# Patient Record
Sex: Male | Born: 1975 | Race: Black or African American | Hispanic: No | Marital: Single | Smoking: Former smoker
Health system: Southern US, Community
[De-identification: ages and names within clinical notes are randomized; demographics above are authoritative.]

## PROBLEM LIST (undated history)

## (undated) ENCOUNTER — Encounter

## (undated) ENCOUNTER — Telehealth

## (undated) ENCOUNTER — Encounter: Attending: Psychologist | Primary: Psychologist

## (undated) ENCOUNTER — Ambulatory Visit: Payer: Medicaid (Managed Care)

## (undated) ENCOUNTER — Ambulatory Visit

## (undated) ENCOUNTER — Encounter: Attending: Nephrology | Primary: Nephrology

## (undated) ENCOUNTER — Non-Acute Institutional Stay: Payer: BLUE CROSS/BLUE SHIELD | Attending: Nephrology | Primary: Nephrology

## (undated) ENCOUNTER — Encounter: Payer: BLUE CROSS/BLUE SHIELD | Attending: Nephrology | Primary: Nephrology

## (undated) ENCOUNTER — Encounter
Payer: BLUE CROSS/BLUE SHIELD | Attending: Pharmacist Clinician (PhC)/ Clinical Pharmacy Specialist | Primary: Pharmacist Clinician (PhC)/ Clinical Pharmacy Specialist

## (undated) ENCOUNTER — Ambulatory Visit: Payer: BLUE CROSS/BLUE SHIELD

## (undated) ENCOUNTER — Ambulatory Visit: Payer: BLUE CROSS/BLUE SHIELD | Attending: Nephrology | Primary: Nephrology

## (undated) ENCOUNTER — Encounter: Attending: Physician Assistant | Primary: Physician Assistant

## (undated) ENCOUNTER — Non-Acute Institutional Stay: Payer: BLUE CROSS/BLUE SHIELD | Attending: General Practice | Primary: General Practice

## (undated) ENCOUNTER — Telehealth: Attending: Psychologist | Primary: Psychologist

## (undated) ENCOUNTER — Ambulatory Visit: Payer: MEDICAID

## (undated) DIAGNOSIS — E109 Type 1 diabetes mellitus without complications: Secondary | ICD-10-CM

## (undated) DIAGNOSIS — K219 Gastro-esophageal reflux disease without esophagitis: Secondary | ICD-10-CM

## (undated) DIAGNOSIS — I10 Essential (primary) hypertension: Secondary | ICD-10-CM

## (undated) DIAGNOSIS — N189 Chronic kidney disease, unspecified: Secondary | ICD-10-CM

## (undated) HISTORY — DX: Essential (primary) hypertension: I10

## (undated) HISTORY — DX: Chronic kidney disease, unspecified: N18.9

## (undated) HISTORY — DX: Type 1 diabetes mellitus without complications: E10.9

## (undated) HISTORY — DX: Gastro-esophageal reflux disease without esophagitis: K21.9

---
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
## 2012-11-24 HISTORY — PX: INSERTION OF DIALYSIS CATHETER: SHX1324

## 2012-12-07 ENCOUNTER — Inpatient Hospital Stay: Payer: Self-pay | Admitting: Internal Medicine

## 2012-12-07 LAB — CBC
MCV: 86 fL (ref 80–100)
Platelet: 402 10*3/uL (ref 150–440)
RBC: 3.85 10*6/uL — ABNORMAL LOW (ref 4.40–5.90)
RDW: 15.3 % — ABNORMAL HIGH (ref 11.5–14.5)
WBC: 8.7 10*3/uL (ref 3.8–10.6)

## 2012-12-07 LAB — COMPREHENSIVE METABOLIC PANEL
Albumin: 2.9 g/dL — ABNORMAL LOW (ref 3.4–5.0)
Anion Gap: 7 (ref 7–16)
BUN: 49 mg/dL — ABNORMAL HIGH (ref 7–18)
Bilirubin,Total: 0.1 mg/dL — ABNORMAL LOW (ref 0.2–1.0)
Calcium, Total: 7.4 mg/dL — ABNORMAL LOW (ref 8.5–10.1)
Co2: 23 mmol/L (ref 21–32)
Creatinine: 8.19 mg/dL — ABNORMAL HIGH (ref 0.60–1.30)
EGFR (African American): 9 — ABNORMAL LOW
EGFR (Non-African Amer.): 7 — ABNORMAL LOW
Glucose: 245 mg/dL — ABNORMAL HIGH (ref 65–99)
Osmolality: 293 (ref 275–301)
Potassium: 5.3 mmol/L — ABNORMAL HIGH (ref 3.5–5.1)
SGOT(AST): 29 U/L (ref 15–37)
Sodium: 136 mmol/L (ref 136–145)
Total Protein: 7.8 g/dL (ref 6.4–8.2)

## 2012-12-07 LAB — URINALYSIS, COMPLETE
Bilirubin,UR: NEGATIVE
Blood: NEGATIVE
Nitrite: NEGATIVE
Ph: 6 (ref 4.5–8.0)
RBC,UR: 1 /HPF (ref 0–5)
Specific Gravity: 1.01 (ref 1.003–1.030)
Squamous Epithelial: <1
WBC UR: 1 /HPF (ref 0–5)

## 2012-12-07 LAB — IRON AND TIBC
Iron Bind.Cap.(Total): 251 ug/dL (ref 250–450)
Iron: 46 ug/dL — ABNORMAL LOW (ref 65–175)

## 2012-12-07 LAB — RAPID HIV-1/2 QL/CONFIRM: HIV-1/2,Rapid Ql: NEGATIVE

## 2012-12-08 LAB — CBC WITH DIFFERENTIAL/PLATELET
Eosinophil %: 5 %
HCT: 29.7 % — ABNORMAL LOW (ref 40.0–52.0)
Lymphocyte #: 2.8 10*3/uL (ref 1.0–3.6)
MCH: 29.1 pg (ref 26.0–34.0)
MCHC: 33.7 g/dL (ref 32.0–36.0)
MCV: 86 fL (ref 80–100)
Monocyte #: 0.7 x10 3/mm (ref 0.2–1.0)
Neutrophil #: 4.8 10*3/uL (ref 1.4–6.5)
Neutrophil %: 54.1 %
Platelet: 374 10*3/uL (ref 150–440)
RBC: 3.44 10*6/uL — ABNORMAL LOW (ref 4.40–5.90)
RDW: 15.2 % — ABNORMAL HIGH (ref 11.5–14.5)
WBC: 8.8 10*3/uL (ref 3.8–10.6)

## 2012-12-08 LAB — BASIC METABOLIC PANEL
Calcium, Total: 7.3 mg/dL — ABNORMAL LOW (ref 8.5–10.1)
Chloride: 107 mmol/L (ref 98–107)
Co2: 23 mmol/L (ref 21–32)
EGFR (African American): 8 — ABNORMAL LOW
EGFR (Non-African Amer.): 7 — ABNORMAL LOW
Osmolality: 298 (ref 275–301)
Sodium: 139 mmol/L (ref 136–145)

## 2012-12-08 LAB — PHOSPHORUS: Phosphorus: 5.6 mg/dL — ABNORMAL HIGH (ref 2.5–4.9)

## 2012-12-08 LAB — PROTEIN ELECTROPHORESIS(ARMC)

## 2012-12-09 LAB — BASIC METABOLIC PANEL
Anion Gap: 8 (ref 7–16)
BUN: 69 mg/dL — ABNORMAL HIGH (ref 7–18)
Creatinine: 8.79 mg/dL — ABNORMAL HIGH (ref 0.60–1.30)
EGFR (African American): 8 — ABNORMAL LOW
EGFR (Non-African Amer.): 7 — ABNORMAL LOW
Glucose: 39 mg/dL — CL (ref 65–99)
Osmolality: 292 (ref 275–301)
Potassium: 4.1 mmol/L (ref 3.5–5.1)

## 2012-12-09 LAB — PHOSPHORUS: Phosphorus: 6.4 mg/dL — ABNORMAL HIGH (ref 2.5–4.9)

## 2012-12-10 LAB — PROTEIN / CREATININE RATIO, URINE
Creatinine, Urine: 99.7 mg/dL (ref 30.0–125.0)
Protein, Random Urine: 251 mg/dL — ABNORMAL HIGH (ref 0–12)
Protein/Creat. Ratio: 2518 mg/gCREAT — ABNORMAL HIGH (ref 0–200)

## 2012-12-11 LAB — PHOSPHORUS: Phosphorus: 4.7 mg/dL (ref 2.5–4.9)

## 2012-12-13 LAB — UR PROT ELECTROPHORESIS, URINE RANDOM

## 2013-01-17 ENCOUNTER — Ambulatory Visit: Payer: Self-pay | Admitting: Vascular Surgery

## 2013-01-17 LAB — BASIC METABOLIC PANEL
Anion Gap: 3 — ABNORMAL LOW (ref 7–16)
Chloride: 103 mmol/L (ref 98–107)
Co2: 25 mmol/L (ref 21–32)
Creatinine: 7.13 mg/dL — ABNORMAL HIGH (ref 0.60–1.30)
EGFR (Non-African Amer.): 9 — ABNORMAL LOW
Glucose: 472 mg/dL — ABNORMAL HIGH (ref 65–99)
Osmolality: 299 (ref 275–301)

## 2013-01-17 LAB — CBC
HGB: 9.5 g/dL — ABNORMAL LOW (ref 13.0–18.0)
MCH: 28.6 pg (ref 26.0–34.0)
MCHC: 33.2 g/dL (ref 32.0–36.0)
MCV: 86 fL (ref 80–100)
Platelet: 358 10*3/uL (ref 150–440)
RDW: 14.4 % (ref 11.5–14.5)
WBC: 10.4 10*3/uL (ref 3.8–10.6)

## 2013-01-18 ENCOUNTER — Ambulatory Visit: Payer: Self-pay | Admitting: Vascular Surgery

## 2013-03-16 ENCOUNTER — Ambulatory Visit (INDEPENDENT_AMBULATORY_CARE_PROVIDER_SITE_OTHER): Payer: Medicaid Other | Admitting: Gastroenterology

## 2013-03-16 ENCOUNTER — Encounter: Payer: Self-pay | Admitting: Gastroenterology

## 2013-03-16 ENCOUNTER — Encounter (INDEPENDENT_AMBULATORY_CARE_PROVIDER_SITE_OTHER): Payer: Self-pay

## 2013-03-16 ENCOUNTER — Other Ambulatory Visit: Payer: Self-pay | Admitting: Gastroenterology

## 2013-03-16 VITALS — BP 140/87 | HR 77 | Temp 97.6°F | Ht 72.5 in | Wt 268.6 lb

## 2013-03-16 DIAGNOSIS — R1013 Epigastric pain: Secondary | ICD-10-CM

## 2013-03-16 DIAGNOSIS — K3189 Other diseases of stomach and duodenum: Secondary | ICD-10-CM

## 2013-03-16 NOTE — Progress Notes (Signed)
Primary Care Physician:  Lucius Conn Primary Gastroenterologist:  Dr. Darrick Penna   Chief Complaint  Patient presents with  . Emesis    HPI:   [name] is a very pleasant 38 year old male who presents today at the request of [facility], [place], [place]. He has a history of Type 1 Diabetes, kidney disease requiring dialysis, and hypertension. Referred to [place] due to chronic vomiting. Wakes up and sneezes, tries to blow nose, starts coughing, then throws up phlegm, mucus, and spit. Only occurs in the morning. Has sensation of pressure in epigastric region. Continues throughout the day. Underlying nausea. Lots of burping for no reason. Quit dark sodas. Symptoms present since April 2014. No pain with eating. No dysphagia. Intermittent reflux. Was placed on Omeprazole, which helps with indigestion. Only takes prn. No melena or hematochezia. Rare NSAIDs for headaches. No aspirin powders. Early satiety but still has good appetite. Feels bloated at times.   Dialysis Tues, Thurs, Sat. Thinking about peritoneal dialysis.   Past Medical History  Diagnosis Date  . Chronic kidney disease   . Type 1 diabetes   . Hypertension   . GERD (gastroesophageal reflux disease)     Past Surgical History  Procedure Laterality Date  . Insertion of dialysis catheter  11/2012    Current Outpatient Prescriptions  Medication Sig Dispense Refill  . cloNIDine (CATAPRES) 0.3 MG tablet Take 0.3 mg by mouth 2 (two) times daily.      . insulin regular (NOVOLIN R,HUMULIN R) 100 units/mL injection Inject 20 Units into the skin 2 (two) times daily before a meal. 20 units in am  20 units in pm      . omeprazole (PRILOSEC) 20 MG capsule Take 20 mg by mouth as needed.       No current facility-administered medications for this visit.    Allergies as of 03/16/2013  . (No Known Allergies)    Family History  Problem Relation Age of Onset  . Colon cancer Neg Hx      History   Social History  . Marital Status: Single    Spouse Name: N/A    Number of Children: N/A  . Years of Education: N/A   Occupational History  . Not on file.   Social History Main Topics  . Smoking status: Current Some Day Smoker -- 0.50 packs/day    Types: Cigarettes  . Smokeless tobacco: Not on file  . Alcohol Use: No  . Drug Use: No  . Sexual Activity: Not on file   Other Topics Concern  . Not on file   Social History Narrative  . No narrative on file    Review of Systems: Negative unless mentioned in HPI.   Physical Exam: BP 140/87  Pulse 77  Temp(Src) 97.6 F (36.4 C) (Oral)  Ht 6' 0.5" (1.842 m)  Wt 268 lb 9.6 oz (121.836 kg)  BMI 35.91 kg/m2 General:   Alert and oriented. Pleasant and cooperative. Well-nourished and well-developed.  Head:  Normocephalic and atraumatic. Eyes:  Without icterus, sclera clear and conjunctiva pink.  Ears:  Normal auditory acuity. Nose:  No deformity, discharge,  or lesions. Mouth:  No deformity or lesions, oral mucosa pink.  Neck:  Supple, without mass or thyromegaly. Lungs:  Clear to auscultation bilaterally. No wheezes, rales, or rhonchi. No distress.  Heart:  S1, S2 present without murmurs appreciated.  Abdomen:  +BS, soft, non-tender and non-distended. No HSM noted. No guarding or rebound. No  masses appreciated.  Rectal:  Deferred  Msk:  Symmetrical without gross deformities. Normal posture. Extremities:  Without clubbing or edema. Neurologic:  Alert and  oriented x4;  grossly normal neurologically. Skin:  Intact without significant lesions or rashes. Cervical Nodes:  No significant cervical adenopathy. Psych:  Alert and cooperative. Normal mood and affect.   Labs Dec 2014:  Hgb 10.2, Hct 31.1 BUN 56, Cr 9.85

## 2013-03-16 NOTE — Patient Instructions (Signed)
We have scheduled you for an upper endoscopy with Dr. Darrick PennaFields in the near future. Further recommendations to follow.  Take Prilosec 20 mg each morning, 30 minutes before breakfast.

## 2013-03-16 NOTE — Assessment & Plan Note (Signed)
38 year old male with chronic vomiting, early satiety, and "pressure" in epigastric region. Underlying nausea noted. Only taking PPI prn. Gastritis, PUD in differentials. With underlying nausea and history of diabetes, unable to rule out gastroparesis. Uncontrolled GERD could also be playing a role. Noted anemia, with Hgb 10.2 in the setting of chronic kidney disease on dialysis. Likely of chronic disease.   Start taking Prilosec daily, not prn Proceed with upper endoscopy in the near future with Dr. Darrick PennaFields. The risks, benefits, and alternatives have been discussed in detail with patient. They have stated understanding and desire to proceed.  Consider GES

## 2013-03-17 NOTE — Progress Notes (Signed)
cc'd to pcp 

## 2013-03-24 LAB — CBC
BUN: 56 mg/dL — AB (ref 4–21)
CREATININE: 9.85
HEMATOCRIT: 31 %
HEMOGLOBIN: 10.2 g/dL

## 2013-03-25 ENCOUNTER — Encounter (HOSPITAL_COMMUNITY): Payer: Self-pay | Admitting: Pharmacy Technician

## 2013-04-06 ENCOUNTER — Ambulatory Visit: Payer: Self-pay | Admitting: Vascular Surgery

## 2013-04-06 LAB — BASIC METABOLIC PANEL
Anion Gap: 5 — ABNORMAL LOW (ref 7–16)
BUN: 40 mg/dL — AB (ref 7–18)
CALCIUM: 8.7 mg/dL (ref 8.5–10.1)
CHLORIDE: 100 mmol/L (ref 98–107)
CREATININE: 9.52 mg/dL — AB (ref 0.60–1.30)
Co2: 27 mmol/L (ref 21–32)
GFR CALC AF AMER: 7 — AB
GFR CALC NON AF AMER: 6 — AB
Glucose: 209 mg/dL — ABNORMAL HIGH (ref 65–99)
OSMOLALITY: 280 (ref 275–301)
Potassium: 5.1 mmol/L (ref 3.5–5.1)
Sodium: 132 mmol/L — ABNORMAL LOW (ref 136–145)

## 2013-04-06 LAB — CBC
HCT: 35.3 % — AB (ref 40.0–52.0)
HGB: 11.7 g/dL — ABNORMAL LOW (ref 13.0–18.0)
MCH: 28.7 pg (ref 26.0–34.0)
MCHC: 33.2 g/dL (ref 32.0–36.0)
MCV: 87 fL (ref 80–100)
Platelet: 355 10*3/uL (ref 150–440)
RBC: 4.09 10*6/uL — ABNORMAL LOW (ref 4.40–5.90)
RDW: 17.6 % — AB (ref 11.5–14.5)
WBC: 7.3 10*3/uL (ref 3.8–10.6)

## 2013-04-08 ENCOUNTER — Encounter (HOSPITAL_COMMUNITY)
Admission: RE | Disposition: A | Discharge status: Home / Self Care | Payer: Self-pay | Source: Ambulatory Visit | Attending: Gastroenterology

## 2013-04-08 ENCOUNTER — Ambulatory Visit (HOSPITAL_COMMUNITY)
Admission: RE | Admit: 2013-04-08 | Discharge: 2013-04-08 | Disposition: A | Discharge status: Home / Self Care | Payer: Medicaid Other | Source: Ambulatory Visit | Attending: Gastroenterology | Admitting: Gastroenterology

## 2013-04-08 DIAGNOSIS — K2940 Chronic atrophic gastritis without bleeding: Secondary | ICD-10-CM | POA: Insufficient documentation

## 2013-04-08 DIAGNOSIS — Z794 Long term (current) use of insulin: Secondary | ICD-10-CM | POA: Insufficient documentation

## 2013-04-08 DIAGNOSIS — E109 Type 1 diabetes mellitus without complications: Secondary | ICD-10-CM | POA: Insufficient documentation

## 2013-04-08 DIAGNOSIS — K2100 Gastro-esophageal reflux disease with esophagitis, without bleeding: Secondary | ICD-10-CM

## 2013-04-08 DIAGNOSIS — R1013 Epigastric pain: Secondary | ICD-10-CM | POA: Insufficient documentation

## 2013-04-08 DIAGNOSIS — Z01812 Encounter for preprocedural laboratory examination: Secondary | ICD-10-CM | POA: Insufficient documentation

## 2013-04-08 DIAGNOSIS — I129 Hypertensive chronic kidney disease with stage 1 through stage 4 chronic kidney disease, or unspecified chronic kidney disease: Secondary | ICD-10-CM | POA: Insufficient documentation

## 2013-04-08 DIAGNOSIS — K2900 Acute gastritis without bleeding: Secondary | ICD-10-CM

## 2013-04-08 DIAGNOSIS — N189 Chronic kidney disease, unspecified: Secondary | ICD-10-CM | POA: Insufficient documentation

## 2013-04-08 DIAGNOSIS — R1110 Vomiting, unspecified: Secondary | ICD-10-CM | POA: Insufficient documentation

## 2013-04-08 HISTORY — PX: ESOPHAGOGASTRODUODENOSCOPY: SHX5428

## 2013-04-08 LAB — GLUCOSE, CAPILLARY: Glucose-Capillary: 107 mg/dL — ABNORMAL HIGH (ref 70–99)

## 2013-04-08 SURGERY — EGD (ESOPHAGOGASTRODUODENOSCOPY)
Anesthesia: Moderate Sedation

## 2013-04-08 MED ORDER — ONDANSETRON 4 MG PO TBDP: ORAL_TABLET | ORAL | Status: DC

## 2013-04-08 MED ORDER — MIDAZOLAM HCL 5 MG/5ML IJ SOLN
INTRAMUSCULAR | Status: DC | PRN
  Administered 2013-04-08 (×3): 2 mg via INTRAVENOUS

## 2013-04-08 MED ORDER — SODIUM CHLORIDE 0.9 % IV SOLN
INTRAVENOUS | Status: DC
  Administered 2013-04-08: 1000 mL via INTRAVENOUS

## 2013-04-08 MED ORDER — OMEPRAZOLE 20 MG PO CPDR: DELAYED_RELEASE_CAPSULE | ORAL | Status: DC

## 2013-04-08 MED ORDER — STERILE WATER FOR IRRIGATION IR SOLN
Status: DC | PRN
  Administered 2013-04-08: 09:00:00

## 2013-04-08 MED ORDER — HYDRALAZINE HCL 20 MG/ML IJ SOLN
INTRAMUSCULAR | Status: DC | PRN
  Administered 2013-04-08 (×2): 10 mg via INTRAVENOUS

## 2013-04-08 MED ORDER — FENTANYL CITRATE 0.05 MG/ML IJ SOLN
INTRAMUSCULAR | Status: AC
  Filled 2013-04-08: qty 4

## 2013-04-08 MED ORDER — MEPERIDINE HCL 100 MG/ML IJ SOLN
INTRAMUSCULAR | Status: AC
  Filled 2013-04-08: qty 2

## 2013-04-08 MED ORDER — FENTANYL CITRATE 0.05 MG/ML IJ SOLN
INTRAMUSCULAR | Status: DC | PRN
  Administered 2013-04-08: 25 ug via INTRAVENOUS
  Administered 2013-04-08 (×2): 50 ug via INTRAVENOUS

## 2013-04-08 MED ORDER — CETIRIZINE HCL 10 MG PO TABS: ORAL_TABLET | ORAL | Status: DC

## 2013-04-08 MED ORDER — LIDOCAINE VISCOUS 2 % MT SOLN
OROMUCOSAL | Status: DC | PRN
  Administered 2013-04-08: 2 mL via OROMUCOSAL

## 2013-04-08 MED ORDER — HYDRALAZINE HCL 20 MG/ML IJ SOLN
INTRAMUSCULAR | Status: AC
  Filled 2013-04-08: qty 1

## 2013-04-08 MED ORDER — MIDAZOLAM HCL 5 MG/5ML IJ SOLN
INTRAMUSCULAR | Status: AC
  Filled 2013-04-08: qty 10

## 2013-04-08 MED ORDER — LIDOCAINE VISCOUS 2 % MT SOLN
OROMUCOSAL | Status: AC
  Filled 2013-04-08: qty 15

## 2013-04-08 NOTE — Discharge Instructions (Signed)
YOUR vomiting & NAUSEA ARE DUE TO coughing/POSTERIOR NASAL DRIP, esophagitis, & gastritis. YOU HAVE A SMALL HIATAL HERNIA. I biopsied your stomach.   INCREASE OMEPRAZOLE.  TAKE 30 MINUTES PRIOR TO YOUR MEALS TWICE DAILY.   CONTINUE YOUR WEIGHT LOSS EFFORTS. YOU SHOULD LOSE 20 LBS.  STRICTLY FOLLOW A LOW FAT/DIABETIC DIET DIET. SEE INFO BELOW.  YOUR BIOPSY WILL BE BACK IN 7 DAYS.  FOLLOW UP MAY 2015.    UPPER ENDOSCOPY AFTER CARE Read the instructions outlined below and refer to this sheet in the next week. These discharge instructions provide you with general information on caring for yourself after you leave the hospital. While your treatment has been planned according to the most current medical practices available, unavoidable complications occasionally occur. If you have any problems or questions after discharge, call DR. Kendle Erker, [phone].  ACTIVITY  You may resume your regular activity, but move at a slower pace for the next 24 hours.   Take frequent rest periods for the next 24 hours.   Walking will help get rid of the air and reduce the bloated feeling in your belly (abdomen).   No driving for 24 hours (because of the medicine (anesthesia) used during the test).   You may shower.   Do not sign any important legal documents or operate any machinery for 24 hours (because of the anesthesia used during the test).    NUTRITION  Drink plenty of fluids.   You may resume your normal diet as instructed by your doctor.   Begin with a light meal and progress to your normal diet. Heavy or fried foods are harder to digest and may make you feel sick to your stomach (nauseated).   Avoid alcoholic beverages for 24 hours or as instructed.    MEDICATIONS  You may resume your normal medications.   WHAT YOU CAN EXPECT TODAY  Some feelings of bloating in the abdomen.   Passage of more gas than usual.    IF YOU HAD A BIOPSY TAKEN DURING THE UPPER ENDOSCOPY:  Eat a soft  diet IF YOU HAVE NAUSEA, BLOATING, ABDOMINAL PAIN, OR VOMITING.    FINDING OUT THE RESULTS OF YOUR TEST Not all test results are available during your visit. DR. Darrick Penna WILL CALL YOU WITHIN 7 DAYS OF YOUR PROCEDUE WITH YOUR RESULTS. Do not assume everything is normal if you have not heard from DR. Waylin Dorko IN ONE WEEK, CALL HER OFFICE AT [phone].  SEEK IMMEDIATE MEDICAL ATTENTION AND CALL THE OFFICE: [phone] IF:  You have more than a spotting of blood in your stool.   Your belly is swollen (abdominal distention).   You are nauseated or vomiting.   You have a temperature over 101F.   You have abdominal pain or discomfort that is severe or gets worse throughout the day.   Gastritis  Gastritis is an inflammation (the body's way of reacting to injury and/or infection) of the stomach. It is often caused by bacterial (germ) infections. It can also be caused BY ASPIRIN, BC/GOODY POWDER'S, (IBUPROFEN) MOTRIN, OR ALEVE (NAPROXEN), chemicals (including alcohol), SPICY FOODS, and medications. This illness may be associated with generalized malaise (feeling tired, not well), UPPER ABDOMINAL STOMACH cramps, and fever. One common bacterial cause of gastritis is an organism known as H. Pylori. This can be treated with antibiotics.   REFLUX Esophagitis   TREATMENT There are a number of medicines used to treat reflux esophagitis including: Antacids.  ZANTAC Proton-pump inhibitors: PRILOSEC (OMEPRAZOLE)  HOME CARE INSTRUCTIONS Eat 2-3  hours before going to bed.  Try to reach and maintain a healthy weight. LOSE 10-20 LBS Do not eat just a few very large meals. Instead, eat 4 TO 6 smaller meals throughout the day.  Try to identify foods and beverages that make your symptoms worse, and avoid these.  Avoid tight clothing.  Do not exercise right after eating.   Low-Fat Diet BREADS, CEREALS, PASTA, RICE, DRIED PEAS, AND BEANS These products are high in carbohydrates and most are low in  fat. Therefore, they can be increased in the diet as substitutes for fatty foods. They too, however, contain calories and should not be eaten in excess. Cereals can be eaten for snacks as well as for breakfast.  Include foods that contain fiber (fruits, vegetables, whole grains, and legumes). Research shows that fiber may lower blood cholesterol levels, especially the water-soluble fiber found in fruits, vegetables, oat products, and legumes. FRUITS AND VEGETABLES It is good to eat fruits and vegetables. Besides being sources of fiber, both are rich in vitamins and some minerals. They help you get the daily allowances of these nutrients. Fruits and vegetables can be used for snacks and desserts. MEATS Limit lean meat, chicken, [place], and fish to no more than 6 ounces per day. Beef, Pork, and Lamb Use lean cuts of beef, pork, and lamb. Lean cuts include:  Extra-lean ground beef.  Arm roast.  Sirloin tip.  Center-cut ham.  Round steak.  Loin chops.  Rump roast.  Tenderloin.  Trim all fat off the outside of meats before cooking. It is not necessary to severely decrease the intake of red meat, but lean choices should be made. Lean meat is rich in protein and contains a highly absorbable form of iron. Premenopausal women, in particular, should avoid reducing lean red meat because this could increase the risk for low red blood cells (iron-deficiency anemia).  Chicken and [place] These are good sources of protein. The fat of poultry can be reduced by removing the skin and underlying fat layers before cooking. Chicken and [place] can be substituted for lean red meat in the diet. Poultry should not be fried or covered with high-fat sauces. Fish and Shellfish Fish is a good source of protein. Shellfish contain cholesterol, but they usually are low in saturated fatty acids. The preparation of fish is important. Like chicken and [place], they should not be fried or covered with high-fat sauces. EGGS Egg  whites contain no fat or cholesterol. They can be eaten often. Try 1 to 2 egg whites instead of whole eggs in recipes or use egg substitutes that do not contain yolk.  MILK AND DAIRY PRODUCTS Use skim or 1% milk instead of 2% or whole milk. Decrease whole milk, natural, and processed cheeses. Use nonfat or low-fat (2%) cottage cheese or low-fat cheeses made from vegetable oils. Choose nonfat or low-fat (1 to 2%) yogurt. Experiment with evaporated skim milk in recipes that call for heavy cream. Substitute low-fat yogurt or low-fat cottage cheese for sour cream in dips and salad dressings. Have at least 2 servings of low-fat dairy products, such as 2 glasses of skim (or 1%) milk each day to help get your daily calcium intake.  FATS AND OILS Butterfat, lard, and beef fats are high in saturated fat and cholesterol. These should be avoided.Vegetable fats do not contain cholesterol. AVOID coconut oil, palm oil, and palm kernel oil, WHICH are very high in saturated fats. These should be limited. These fats are often used in [employer]akery goods,  processed foods, popcorn, oils, and nondairy creamers. Vegetable shortenings and some peanut butters contain hydrogenated oils, which are also saturated fats. Read the labels on these foods and check for saturated vegetable oils.  Desirable liquid vegetable oils are corn oil, cottonseed oil, olive oil, canola oil, safflower oil, soybean oil, and sunflower oil. Peanut oil is not as good, but small amounts are acceptable. Buy a heart-healthy tub margarine that has no partially hydrogenated oils in the ingredients. AVOID Mayonnaise and salad dressings often are made from unsaturated fats.  OTHER EATING TIPS Snacks  Most sweets should be limited as snacks. They tend to be rich in calories and fats, and their caloric content outweighs their nutritional value. Some good choices in snacks are graham crackers, melba toast, soda crackers, bagels (no egg), English muffins, fruits, and  vegetables. These snacks are preferable to snack crackers, [place] fries, and chips. Popcorn should be air-popped or cooked in small amounts of liquid vegetable oil.  Desserts Eat fruit, low-fat yogurt, and fruit ices instead of pastries, cake, and cookies. Sherbet, angel food cake, gelatin dessert, frozen low-fat yogurt, or other frozen products that do not contain saturated fat (pure fruit juice bars, frozen ice pops) are also acceptable.   COOKING METHODS Choose those methods that use little or no fat. They include: Poaching.  Braising.  Steaming.  Grilling.  Baking.  Stir-frying.  Broiling.  Microwaving.  Foods can be cooked in a nonstick pan without added fat, or use a nonfat cooking spray in regular cookware. Limit fried foods and avoid frying in saturated fat. Add moisture to lean meats by using water, broth, cooking wines, and other nonfat or low-fat sauces along with the cooking methods mentioned above. Soups and stews should be chilled after cooking. The fat that forms on top after a few hours in the refrigerator should be skimmed off. When preparing meals, avoid using excess salt. Salt can contribute to raising blood pressure in some people.  EATING AWAY FROM HOME Order entres, potatoes, and vegetables without sauces or butter. When meat exceeds the size of a deck of cards (3 to 4 ounces), the rest can be taken home for another meal. Choose vegetable or fruit salads and ask for low-calorie salad dressings to be served on the side. Use dressings sparingly. Limit high-fat toppings, such as bacon, crumbled eggs, cheese, sunflower seeds, and olives. Ask for heart-healthy tub margarine instead of butter.  Hiatal Hernia A hiatal hernia occurs when a part of the stomach slides above the diaphragm. The diaphragm is the thin muscle separating the belly (abdomen) from the chest. A hiatal hernia can be something you are born with or develop over time. Hiatal hernias may allow stomach acid to  flow back into your esophagus, the tube which carries food from your mouth to your stomach. If this acid causes problems it is called GERD (gastro-esophageal reflux disease).   SYMPTOMS Common symptoms of GERD are heartburn (burning in your chest). This is worse when lying down or bending over. It may also cause belching and indigestion. Some of the things which make GERD worse are:  Increased weight pushes on stomach making acid rise more easily.   Smoking markedly increases acid production.   Alcohol decreases lower esophageal sphincter pressure (valve between stomach and esophagus), allowing acid from stomach into esophagus.   Late evening meals and going to bed with a full stomach increases pressure.   Anything that causes an increase in acid production.    HOME CARE INSTRUCTIONS  Try to achieve and maintain an ideal body weight.   Avoid drinking alcoholic beverages.   DO NOT smokE.   Do not wear tight clothing around your chest or stomach.   Eat smaller meals and eat more frequently. This keeps your stomach from getting too full. Eat slowly.   Do not lie down for 2 or 3 hours after eating. Do not eat or drink anything 1 to 2 hours before going to bed.   Avoid caffeine beverages (colas, coffee, cocoa, tea), fatty foods, citrus fruits and all other foods and drinks that contain acid and that seem to increase the problems.   Avoid bending over, especially after eating OR STRAINING. Anything that increases the pressure in your belly increases the amount of acid that may be pushed up into your esophagus.

## 2013-04-08 NOTE — Progress Notes (Signed)
REVIEWED.  

## 2013-04-08 NOTE — Progress Notes (Signed)
Has dialysis catheter right chest. Does dialysis tues, thurs and sat. Last dialized on tues 04/05/13.

## 2013-04-08 NOTE — H&P (Signed)
Primary Care Physician:  Lucius ConnOBERTSON, ANTHONY T, PA-C Primary Gastroenterologist:  Dr. Darrick PennaFields  Pre-Procedure History & Physical: HPI:  [name] is a 38 y.o. male here for DYSPEPSIA.  Past Medical History  Diagnosis Date  . Chronic kidney disease   . Type 1 diabetes   . Hypertension   . GERD (gastroesophageal reflux disease)     Past Surgical History  Procedure Laterality Date  . Insertion of dialysis catheter  11/2012    Prior to Admission medications   Medication Sig Start Date End Date Taking? Authorizing Provider  cloNIDine (CATAPRES) 0.3 MG tablet Take 0.3 mg by mouth 2 (two) times daily.    Historical Provider, MD  insulin NPH Human (HUMULIN N,NOVOLIN N) 100 UNIT/ML injection Inject 20 Units into the skin 2 (two) times daily before a meal.    Historical Provider, MD  insulin regular (NOVOLIN R,HUMULIN R) 100 units/mL injection Inject 20 Units into the skin 2 (two) times daily before a meal.     Historical Provider, MD  omeprazole (PRILOSEC) 20 MG capsule Take 20 mg by mouth daily as needed (Acid Reflux).     Historical Provider, MD    Allergies as of 03/16/2013  . (No Known Allergies)    Family History  Problem Relation Age of Onset  . Colon cancer Neg Hx     History   Social History  . Marital Status: Single    Spouse Name: N/A    Number of Children: N/A  . Years of Education: N/A   Occupational History  . Not on file.   Social History Main Topics  . Smoking status: Current Some Day Smoker -- 0.50 packs/day    Types: Cigarettes  . Smokeless tobacco: Not on file  . Alcohol Use: No  . Drug Use: No  . Sexual Activity: Not on file   Other Topics Concern  . Not on file   Social History Narrative  . No narrative on file    Review of Systems: See HPI, otherwise negative ROS   Physical Exam: There were no vitals taken for this visit. General:   Alert,  pleasant and cooperative in NAD Head:  Normocephalic and atraumatic. Neck:  Supple; Lungs:   Clear throughout to auscultation.    Heart:  Regular rate and rhythm. Abdomen:  Soft, nontender and nondistended. Normal bowel sounds, without guarding, and without rebound.   Neurologic:  Alert and  oriented x4;  grossly normal neurologically.  Impression/Plan:     DYSPEPSIA  PLAN:  EGD TODAY

## 2013-04-08 NOTE — Op Note (Signed)
[facility] [address] [place], [id number]   ENDOSCOPY PROCEDURE REPORT  PATIENT: [name], [name]  MR#: [id number] BIRTHDATE: 1975/06/26 , 37  yrs. old GENDER: Male  ENDOSCOPIST: Jonette EvaSandi Zainah Steven, MD REFERRED QI:ONGEXBMBY:Anthony Robertsobn, PA-C  PROCEDURE DATE: 04/08/2013 PROCEDURE:   EGD w/ biopsy INDICATIONS:Vomiting USU EVERY AM SINCE APR 2014(BMI 35.9) PRECEDED BY SNEEZING/ PND/COUGHING.[name].   Epigastric pain.   Heartburn. PMHx: WU(1324M(1993) NOW ON HD MEDICATIONS: Fentanyl 125 mcg IV, Versed 6 mg IV, and Promethazine (Phenergan) 12.5mg  IV TOPICAL ANESTHETIC:   Viscous Xylocaine  DESCRIPTION OF PROCEDURE:     Physical exam was performed.  Informed consent was obtained from the patient after explaining the benefits, risks, and alternatives to the procedure.  The patient was connected to the monitor and placed in the left lateral position.  Continuous oxygen was provided by nasal cannula and IV medicine administered through an indwelling cannula.  After administration of sedation, the patients esophagus was intubated and the EG-2990i ([id number]) and EG-2990i ([id number])  endoscope was advanced under direct visualization to the second portion of the duodenum.  The scope was removed slowly by carefully examining the color, texture, anatomy, and integrity of the mucosa on the way out.  The patient was recovered in endoscopy and discharged home in satisfactory condition.   ESOPHAGUS: MULTIPLE LINEAR EROSIONS IN DISTAL ESOPHAGUS.   STOMACH: Mild erosive gastritis (inflammation) was found in the gastric antrum.  Multiple biopsies were performed using cold forceps. DUODENUM: The duodenal mucosa showed no abnormalities in the bulb and second portion of the duodenum.  COMPLICATIONS:   None  ENDOSCOPIC IMPRESSION: 1.  NAUSEA/VOMITING/DUE TO POST-TUSSIVE EMESIS, REFLUX ESOPHAGITIS, AND GASTRITIS 2.  EPIGASTRIC PAIN DUE TO REFLUX ESOPHAGITIS/GASTRITIS  RECOMMENDATIONS: INCREASE  OMEPRAZOLE.  TAKE 30 MINUTES PRIOR TO YOUR MEALS TWICE DAILY. ZYRTEC 10 MG QHS PT WILL CALL IN 2 WEEKS IF SYMPTOMS ARE NOT IMPROVED. CONTINUE WEIGHT LOSS EFFORTS.  LOSE 20 LBS. STRICTLY FOLLOW A LOW FAT/DIABETIC DIET DIET. BIOPSY WILL BE BACK IN 7 DAYS. FOLLOW UP MAY 2015. CONSIDER GES IF VOMITING   REPEAT EXAM:   _______________________________ Rosalie DoctoreSignedJonette Eva:  Alla Sloma, MD 04/08/2013 5:10 PM       PATIENT NAME:  [name], [name] MR#: [id number]

## 2013-04-12 ENCOUNTER — Encounter (HOSPITAL_COMMUNITY): Payer: Self-pay | Admitting: Gastroenterology

## 2013-04-14 ENCOUNTER — Telehealth: Payer: Self-pay | Admitting: Gastroenterology

## 2013-04-14 NOTE — Telephone Encounter (Signed)
Please call pt. His stomach Bx shows gastritis.    USE ZYRTEC QHS.  INCREASE OMEPRAZOLE.  TAKE 30 MINUTES PRIOR TO YOUR MEALS TWICE DAILY.   CONTINUE YOUR WEIGHT LOSS EFFORTS.  LOSE 20 LBS.  STRICTLY FOLLOW A LOW FAT/DIABETIC DIET DIET.  FOLLOW UP MAY 2015 E30 NAUSEA W/ AS OR SLF.

## 2013-04-15 ENCOUNTER — Ambulatory Visit: Payer: Self-pay | Admitting: Vascular Surgery

## 2013-04-15 NOTE — Telephone Encounter (Signed)
LMOM to call.

## 2013-04-19 NOTE — Telephone Encounter (Signed)
Reminder in epic °

## 2013-04-20 NOTE — Telephone Encounter (Signed)
Pt returned call and was informed of results.  

## 2013-04-20 NOTE — Telephone Encounter (Signed)
LMOM to call.

## 2013-11-01 ENCOUNTER — Inpatient Hospital Stay: Payer: Self-pay | Admitting: Internal Medicine

## 2013-11-01 LAB — CBC WITH DIFFERENTIAL/PLATELET
BASOS ABS: 0.1 10*3/uL (ref 0.0–0.1)
Basophil %: 0.8 %
Eosinophil #: 0.1 10*3/uL (ref 0.0–0.7)
Eosinophil %: 0.6 %
HCT: 28.6 % — AB (ref 40.0–52.0)
HGB: 9 g/dL — ABNORMAL LOW (ref 13.0–18.0)
Lymphocyte #: 0.8 10*3/uL — ABNORMAL LOW (ref 1.0–3.6)
Lymphocyte %: 5.5 %
MCH: 28 pg (ref 26.0–34.0)
MCHC: 31.6 g/dL — ABNORMAL LOW (ref 32.0–36.0)
MCV: 89 fL (ref 80–100)
Monocyte #: 0.4 x10 3/mm (ref 0.2–1.0)
Monocyte %: 2.7 %
NEUTROS ABS: 13.3 10*3/uL — AB (ref 1.4–6.5)
Neutrophil %: 90.4 %
PLATELETS: 455 10*3/uL — AB (ref 150–440)
RBC: 3.23 10*6/uL — ABNORMAL LOW (ref 4.40–5.90)
RDW: 16 % — ABNORMAL HIGH (ref 11.5–14.5)
WBC: 14.7 10*3/uL — AB (ref 3.8–10.6)

## 2013-11-01 LAB — COMPREHENSIVE METABOLIC PANEL
Albumin: 2.2 g/dL — ABNORMAL LOW (ref 3.4–5.0)
Alkaline Phosphatase: 80 U/L
Anion Gap: 13 (ref 7–16)
BUN: 54 mg/dL — AB (ref 7–18)
Bilirubin,Total: 0.4 mg/dL (ref 0.2–1.0)
CO2: 22 mmol/L (ref 21–32)
Calcium, Total: 6.5 mg/dL — CL (ref 8.5–10.1)
Chloride: 101 mmol/L (ref 98–107)
Creatinine: 16.27 mg/dL — ABNORMAL HIGH (ref 0.60–1.30)
EGFR (African American): 4 — ABNORMAL LOW
GFR CALC NON AF AMER: 3 — AB
GLUCOSE: 177 mg/dL — AB (ref 65–99)
OSMOLALITY: 291 (ref 275–301)
Potassium: 6.5 mmol/L (ref 3.5–5.1)
SGOT(AST): 42 U/L — ABNORMAL HIGH (ref 15–37)
SGPT (ALT): 33 U/L
SODIUM: 136 mmol/L (ref 136–145)
TOTAL PROTEIN: 7.3 g/dL (ref 6.4–8.2)

## 2013-11-02 LAB — BASIC METABOLIC PANEL
ANION GAP: 13 (ref 7–16)
BUN: 50 mg/dL — ABNORMAL HIGH (ref 7–18)
CALCIUM: 6.7 mg/dL — AB (ref 8.5–10.1)
CHLORIDE: 101 mmol/L (ref 98–107)
Co2: 22 mmol/L (ref 21–32)
Creatinine: 17.04 mg/dL — ABNORMAL HIGH (ref 0.60–1.30)
EGFR (African American): 4 — ABNORMAL LOW
GFR CALC NON AF AMER: 3 — AB
GLUCOSE: 129 mg/dL — AB (ref 65–99)
Osmolality: 287 (ref 275–301)
POTASSIUM: 5.4 mmol/L — AB (ref 3.5–5.1)
Sodium: 136 mmol/L (ref 136–145)

## 2013-11-02 LAB — CBC WITH DIFFERENTIAL/PLATELET
BASOS PCT: 0.8 %
Basophil #: 0.1 10*3/uL (ref 0.0–0.1)
Eosinophil #: 0.4 10*3/uL (ref 0.0–0.7)
Eosinophil %: 2.8 %
HCT: 25 % — ABNORMAL LOW (ref 40.0–52.0)
HGB: 8 g/dL — ABNORMAL LOW (ref 13.0–18.0)
LYMPHS ABS: 1.2 10*3/uL (ref 1.0–3.6)
Lymphocyte %: 9.7 %
MCH: 27.9 pg (ref 26.0–34.0)
MCHC: 32 g/dL (ref 32.0–36.0)
MCV: 87 fL (ref 80–100)
Monocyte #: 0.9 x10 3/mm (ref 0.2–1.0)
Monocyte %: 7.4 %
NEUTROS ABS: 10.2 10*3/uL — AB (ref 1.4–6.5)
NEUTROS PCT: 79.3 %
Platelet: 401 10*3/uL (ref 150–440)
RBC: 2.88 10*6/uL — ABNORMAL LOW (ref 4.40–5.90)
RDW: 16.2 % — ABNORMAL HIGH (ref 11.5–14.5)
WBC: 12.9 10*3/uL — ABNORMAL HIGH (ref 3.8–10.6)

## 2013-11-03 LAB — RENAL FUNCTION PANEL
ANION GAP: 10 (ref 7–16)
Albumin: 1.8 g/dL — ABNORMAL LOW (ref 3.4–5.0)
BUN: 42 mg/dL — AB (ref 7–18)
CHLORIDE: 98 mmol/L (ref 98–107)
CREATININE: 12.48 mg/dL — AB (ref 0.60–1.30)
Calcium, Total: 7.3 mg/dL — ABNORMAL LOW (ref 8.5–10.1)
Co2: 27 mmol/L (ref 21–32)
EGFR (African American): 5 — ABNORMAL LOW
GFR CALC NON AF AMER: 4 — AB
Glucose: 126 mg/dL — ABNORMAL HIGH (ref 65–99)
Osmolality: 282 (ref 275–301)
Phosphorus: 6 mg/dL — ABNORMAL HIGH (ref 2.5–4.9)
Potassium: 4.1 mmol/L (ref 3.5–5.1)
SODIUM: 135 mmol/L — AB (ref 136–145)

## 2013-11-04 LAB — DRUG SCREEN, URINE
AMPHETAMINES, UR SCREEN: NEGATIVE (ref ?–1000)
BENZODIAZEPINE, UR SCRN: POSITIVE (ref ?–200)
Barbiturates, Ur Screen: NEGATIVE (ref ?–200)
Cannabinoid 50 Ng, Ur ~~LOC~~: NEGATIVE (ref ?–50)
Cocaine Metabolite,Ur ~~LOC~~: NEGATIVE (ref ?–300)
MDMA (ECSTASY) UR SCREEN: NEGATIVE (ref ?–500)
METHADONE, UR SCREEN: NEGATIVE (ref ?–300)
Opiate, Ur Screen: NEGATIVE (ref ?–300)
PHENCYCLIDINE (PCP) UR S: NEGATIVE (ref ?–25)
Tricyclic, Ur Screen: NEGATIVE (ref ?–1000)

## 2013-11-04 LAB — CBC WITH DIFFERENTIAL/PLATELET
BASOS ABS: 0.2 10*3/uL — AB (ref 0.0–0.1)
BASOS PCT: 1.4 %
Eosinophil #: 0 10*3/uL (ref 0.0–0.7)
Eosinophil %: 0.2 %
HCT: 23.9 % — AB (ref 40.0–52.0)
HGB: 7.5 g/dL — ABNORMAL LOW (ref 13.0–18.0)
LYMPHS ABS: 1.4 10*3/uL (ref 1.0–3.6)
LYMPHS PCT: 10.6 %
MCH: 27.9 pg (ref 26.0–34.0)
MCHC: 31.6 g/dL — AB (ref 32.0–36.0)
MCV: 88 fL (ref 80–100)
MONO ABS: 1.1 x10 3/mm — AB (ref 0.2–1.0)
Monocyte %: 8.3 %
NEUTROS PCT: 79.5 %
Neutrophil #: 10.2 10*3/uL — ABNORMAL HIGH (ref 1.4–6.5)
PLATELETS: 392 10*3/uL (ref 150–440)
RBC: 2.71 10*6/uL — AB (ref 4.40–5.90)
RDW: 15.5 % — AB (ref 11.5–14.5)
WBC: 12.8 10*3/uL — AB (ref 3.8–10.6)

## 2013-11-04 LAB — BASIC METABOLIC PANEL
ANION GAP: 14 (ref 7–16)
BUN: 27 mg/dL — ABNORMAL HIGH (ref 7–18)
CO2: 25 mmol/L (ref 21–32)
Calcium, Total: 7.1 mg/dL — ABNORMAL LOW (ref 8.5–10.1)
Chloride: 96 mmol/L — ABNORMAL LOW (ref 98–107)
Creatinine: 9.67 mg/dL — ABNORMAL HIGH (ref 0.60–1.30)
EGFR (African American): 7 — ABNORMAL LOW
EGFR (Non-African Amer.): 6 — ABNORMAL LOW
Glucose: 250 mg/dL — ABNORMAL HIGH (ref 65–99)
Osmolality: 284 (ref 275–301)
POTASSIUM: 4.6 mmol/L (ref 3.5–5.1)
Sodium: 135 mmol/L — ABNORMAL LOW (ref 136–145)

## 2013-11-05 DIAGNOSIS — I369 Nonrheumatic tricuspid valve disorder, unspecified: Secondary | ICD-10-CM

## 2013-11-05 LAB — CBC WITH DIFFERENTIAL/PLATELET
BASOS ABS: 0.1 10*3/uL (ref 0.0–0.1)
Basophil %: 0.6 %
EOS PCT: 0.3 %
Eosinophil #: 0 10*3/uL (ref 0.0–0.7)
HCT: 24.1 % — AB (ref 40.0–52.0)
HGB: 7.6 g/dL — AB (ref 13.0–18.0)
LYMPHS PCT: 7.1 %
Lymphocyte #: 1 10*3/uL (ref 1.0–3.6)
MCH: 28 pg (ref 26.0–34.0)
MCHC: 31.6 g/dL — AB (ref 32.0–36.0)
MCV: 89 fL (ref 80–100)
Monocyte #: 0.8 x10 3/mm (ref 0.2–1.0)
Monocyte %: 5.7 %
Neutrophil #: 12.4 10*3/uL — ABNORMAL HIGH (ref 1.4–6.5)
Neutrophil %: 86.3 %
Platelet: 434 10*3/uL (ref 150–440)
RBC: 2.72 10*6/uL — ABNORMAL LOW (ref 4.40–5.90)
RDW: 15.8 % — ABNORMAL HIGH (ref 11.5–14.5)
WBC: 14.4 10*3/uL — AB (ref 3.8–10.6)

## 2013-11-05 LAB — RENAL FUNCTION PANEL
Albumin: 1.7 g/dL — ABNORMAL LOW (ref 3.4–5.0)
Anion Gap: 15 (ref 7–16)
BUN: 41 mg/dL — AB (ref 7–18)
CREATININE: 12.11 mg/dL — AB (ref 0.60–1.30)
Calcium, Total: 7.4 mg/dL — ABNORMAL LOW (ref 8.5–10.1)
Chloride: 94 mmol/L — ABNORMAL LOW (ref 98–107)
Co2: 23 mmol/L (ref 21–32)
EGFR (African American): 5 — ABNORMAL LOW
EGFR (Non-African Amer.): 5 — ABNORMAL LOW
Glucose: 325 mg/dL — ABNORMAL HIGH (ref 65–99)
Osmolality: 287 (ref 275–301)
PHOSPHORUS: 7.1 mg/dL — AB (ref 2.5–4.9)
Potassium: 4.5 mmol/L (ref 3.5–5.1)
Sodium: 132 mmol/L — ABNORMAL LOW (ref 136–145)

## 2013-11-05 LAB — HEPATIC FUNCTION PANEL A (ARMC)
ALK PHOS: 69 U/L
ALT: 10 U/L — AB
AST: 18 U/L (ref 15–37)
Albumin: 1.7 g/dL — ABNORMAL LOW (ref 3.4–5.0)
Bilirubin, Direct: <0.1 mg/dL (ref 0.00–0.20)
Bilirubin,Total: 0.4 mg/dL (ref 0.2–1.0)
Total Protein: 6.6 g/dL (ref 6.4–8.2)

## 2013-11-05 LAB — SEDIMENTATION RATE: Erythrocyte Sed Rate: >140 mm/hr — ABNORMAL HIGH (ref 0–15)

## 2013-11-05 LAB — TSH: Thyroid Stimulating Horm: 2.15 u[IU]/mL

## 2013-11-05 LAB — AMMONIA: Ammonia, Plasma: <10 mcmol/L (ref 11–32)

## 2013-11-06 LAB — HEMOGLOBIN A1C: HEMOGLOBIN A1C: 7.5 % — AB (ref 4.2–6.3)

## 2013-11-06 LAB — URINE CULTURE

## 2013-11-06 LAB — LIPID PANEL
CHOLESTEROL: 160 mg/dL (ref 0–200)
HDL: 23 mg/dL — AB (ref 40–60)
Ldl Cholesterol, Calc: 92 mg/dL (ref 0–100)
Triglycerides: 224 mg/dL — ABNORMAL HIGH (ref 0–200)
VLDL Cholesterol, Calc: 45 mg/dL — ABNORMAL HIGH (ref 5–40)

## 2013-11-07 LAB — CBC WITH DIFFERENTIAL/PLATELET
BASOS ABS: 0.1 10*3/uL (ref 0.0–0.1)
Basophil %: 0.6 %
Eosinophil #: 0.2 10*3/uL (ref 0.0–0.7)
Eosinophil %: 1 %
HCT: 27.6 % — AB (ref 40.0–52.0)
HGB: 8.7 g/dL — ABNORMAL LOW (ref 13.0–18.0)
LYMPHS PCT: 8.8 %
Lymphocyte #: 1.7 10*3/uL (ref 1.0–3.6)
MCH: 27.8 pg (ref 26.0–34.0)
MCHC: 31.5 g/dL — ABNORMAL LOW (ref 32.0–36.0)
MCV: 88 fL (ref 80–100)
Monocyte #: 1 x10 3/mm (ref 0.2–1.0)
Monocyte %: 5.3 %
Neutrophil #: 16.6 10*3/uL — ABNORMAL HIGH (ref 1.4–6.5)
Neutrophil %: 84.3 %
PLATELETS: 581 10*3/uL — AB (ref 150–440)
RBC: 3.12 10*6/uL — AB (ref 4.40–5.90)
RDW: 15.4 % — AB (ref 11.5–14.5)
WBC: 19.7 10*3/uL — ABNORMAL HIGH (ref 3.8–10.6)

## 2013-11-07 LAB — URINALYSIS, COMPLETE
Bacteria: NONE SEEN
Bilirubin,UR: NEGATIVE
Blood: NEGATIVE
Glucose,UR: 150 mg/dL (ref 0–75)
Leukocyte Esterase: NEGATIVE
Nitrite: NEGATIVE
Ph: 7 (ref 4.5–8.0)
Protein: >=500
RBC,UR: <1 /HPF (ref 0–5)
Specific Gravity: 1.013 (ref 1.003–1.030)
Squamous Epithelial: NONE SEEN

## 2013-11-07 LAB — RENAL FUNCTION PANEL
ALBUMIN: 1.6 g/dL — AB (ref 3.4–5.0)
Anion Gap: 15 (ref 7–16)
BUN: 52 mg/dL — ABNORMAL HIGH (ref 7–18)
CO2: 27 mmol/L (ref 21–32)
CREATININE: 10.69 mg/dL — AB (ref 0.60–1.30)
Calcium, Total: 7.2 mg/dL — ABNORMAL LOW (ref 8.5–10.1)
Chloride: 94 mmol/L — ABNORMAL LOW (ref 98–107)
EGFR (Non-African Amer.): 5 — ABNORMAL LOW
GFR CALC AF AMER: 6 — AB
GLUCOSE: 252 mg/dL — AB (ref 65–99)
Osmolality: 295 (ref 275–301)
POTASSIUM: 4.3 mmol/L (ref 3.5–5.1)
Phosphorus: 7 mg/dL — ABNORMAL HIGH (ref 2.5–4.9)
SODIUM: 136 mmol/L (ref 136–145)

## 2013-11-07 LAB — CULTURE, BLOOD (SINGLE)

## 2013-11-08 LAB — CBC WITH DIFFERENTIAL/PLATELET
BASOS PCT: 0.9 %
Basophil #: 0.1 10*3/uL (ref 0.0–0.1)
EOS ABS: 0.4 10*3/uL (ref 0.0–0.7)
EOS PCT: 2.8 %
HCT: 27.7 % — ABNORMAL LOW (ref 40.0–52.0)
HGB: 8.6 g/dL — ABNORMAL LOW (ref 13.0–18.0)
LYMPHS PCT: 14.9 %
Lymphocyte #: 1.9 10*3/uL (ref 1.0–3.6)
MCH: 27.4 pg (ref 26.0–34.0)
MCHC: 31 g/dL — AB (ref 32.0–36.0)
MCV: 88 fL (ref 80–100)
MONO ABS: 0.9 x10 3/mm (ref 0.2–1.0)
Monocyte %: 7.3 %
Neutrophil #: 9.5 10*3/uL — ABNORMAL HIGH (ref 1.4–6.5)
Neutrophil %: 74.1 %
PLATELETS: 529 10*3/uL — AB (ref 150–440)
RBC: 3.13 10*6/uL — AB (ref 4.40–5.90)
RDW: 15.9 % — ABNORMAL HIGH (ref 11.5–14.5)
WBC: 12.9 10*3/uL — ABNORMAL HIGH (ref 3.8–10.6)

## 2013-11-09 LAB — BODY FLUID CELL COUNT WITH DIFFERENTIAL
Basophil: 0 %
Eosinophil: 0 %
Lymphocytes: 19 %
Neutrophils: 51 %
Nucleated Cell Count: 13773 /mm3
Other Cells BF: 0 %
Other Mononuclear Cells: 30 %

## 2013-11-09 LAB — URINE CULTURE

## 2013-11-09 LAB — CULTURE, BLOOD (SINGLE)

## 2013-11-10 LAB — CBC WITH DIFFERENTIAL/PLATELET
BASOS ABS: 0.2 10*3/uL — AB (ref 0.0–0.1)
Basophil %: 1.9 %
EOS ABS: 0.5 10*3/uL (ref 0.0–0.7)
Eosinophil %: 5.4 %
HCT: 26.1 % — ABNORMAL LOW (ref 40.0–52.0)
HGB: 8.1 g/dL — AB (ref 13.0–18.0)
LYMPHS ABS: 1.8 10*3/uL (ref 1.0–3.6)
Lymphocyte %: 18.3 %
MCH: 27.8 pg (ref 26.0–34.0)
MCHC: 31 g/dL — AB (ref 32.0–36.0)
MCV: 90 fL (ref 80–100)
Monocyte #: 0.9 x10 3/mm (ref 0.2–1.0)
Monocyte %: 8.7 %
NEUTROS PCT: 65.7 %
Neutrophil #: 6.5 10*3/uL (ref 1.4–6.5)
Platelet: 509 10*3/uL — ABNORMAL HIGH (ref 150–440)
RBC: 2.91 10*6/uL — ABNORMAL LOW (ref 4.40–5.90)
RDW: 15.9 % — ABNORMAL HIGH (ref 11.5–14.5)
WBC: 9.9 10*3/uL (ref 3.8–10.6)

## 2013-11-10 LAB — RENAL FUNCTION PANEL
ANION GAP: 12 (ref 7–16)
Albumin: 1.6 g/dL — ABNORMAL LOW (ref 3.4–5.0)
BUN: 48 mg/dL — ABNORMAL HIGH (ref 7–18)
CO2: 28 mmol/L (ref 21–32)
Calcium, Total: 7.1 mg/dL — ABNORMAL LOW (ref 8.5–10.1)
Chloride: 97 mmol/L — ABNORMAL LOW (ref 98–107)
Creatinine: 10.38 mg/dL — ABNORMAL HIGH (ref 0.60–1.30)
EGFR (African American): 6 — ABNORMAL LOW
EGFR (Non-African Amer.): 6 — ABNORMAL LOW
GLUCOSE: 127 mg/dL — AB (ref 65–99)
Osmolality: 288 (ref 275–301)
PHOSPHORUS: 5.7 mg/dL — AB (ref 2.5–4.9)
POTASSIUM: 4.5 mmol/L (ref 3.5–5.1)
Sodium: 137 mmol/L (ref 136–145)

## 2013-11-10 LAB — BODY FLUID CELL COUNT WITH DIFFERENTIAL
Basophil: 0 %
Eosinophil: 0 %
LYMPHS PCT: 17 %
NEUTROS PCT: 79 %
Nucleated Cell Count: 14810 /mm3
Other Cells BF: 0 %
Other Mononuclear Cells: 4 %

## 2013-11-11 ENCOUNTER — Other Ambulatory Visit (HOSPITAL_COMMUNITY): Payer: Self-pay | Admitting: Physician Assistant

## 2013-11-11 ENCOUNTER — Encounter: Payer: Self-pay | Admitting: *Deleted

## 2013-11-11 LAB — PLATELET COUNT: PLATELETS: 512 10*3/uL — AB (ref 150–440)

## 2013-11-11 LAB — VANCOMYCIN, TROUGH: Vancomycin, Trough: 11 ug/mL (ref 10–20)

## 2013-11-11 NOTE — PMR Pre-admission (Incomplete)
Secondary Market PMR Admission Coordinator Pre-Admission Assessment  Patient: [name] is an 38 y.o., male MRN: [id number] DOB: 1975-10-12 Height: 6' (182.9 cm) Weight: 112.946 kg (249 lb)  Insurance Information HMO:     PPO:      PCP:      IPA:      80/20:      OTHER:  PRIMARY: Medicaid [place] [employer]      Policy#: [id number]      Subscriber: self Benefits:  Phone #: [phone]      Eff. Date: eligible as of 11-11-13     Deduct:       Out of Pocket Max:      Life Max:  CIR:  Covered      SNF: covered Outpatient: covered     Co-Pay:  Home Health: covered      Co-Pay:  DME: covered     Co-Pay:  Providers: in network   Emergency Contact Information Contact Information   Name Relation Home Work Mobile   [place] Sister [phone]  [phone]   [place], [place]   [phone]   Judith, [place]   [phone]      Current Medical History  Patient Admitting Diagnosis: bilateral CVA's with ESRD  History of Present Illness: 37 year old right-handed male with history of end-stage renal disease with peritoneal dialysis, hypertension, diabetes mellitus with peripheral neuropathy and tobacco abuse. Presented to [facility] 11/01/2013 with noted low flow with his exchanges during peritoneal dialysis. He was initially given a new machine but  still with no change. He presented to the emergency room the potassium level 6.5, BUN 54, creatinine 16.2. EKG unremarkable. Patient received Kayexalate for hyperkalemia. Hemodialysis was initiated while in the hospital after dialysis catheter placed and followup renal services Dr.Lateef and plan to resume peritoneal dialysis once discharged from the hospital. His peritoneal catheter had been flushed. Hospital course patient developed acute left facial droop and altered mental status. Initial cranial CT scan negative for acute abnormalities. MRI of the brain shows numerous areas of restricted diffusion in  the cerebral white matter bilaterally corpus callosum right anterior medulla suggesting acute infarct. Carotid Dopplers no ICA stenosis. Echocardiogram showed no thrombus or valvular abnormalities. Neurology services consulted per Dr. Tamala Julian. Patient did not receive TPA. Maintained on aspirin for CVA prophylaxis. His blood sugars have some increased variables and it was reported that his wife is bringing in some outside films that did not meet his diet restrictions. Celexa was initiated for hospital acquired depression. Chest x-ray with right lower lobe pneumonia responding well to Levaquin. Infectious disease were consulted on 11-11-13 to address peritonitis and recommended vanc for further treatment.   Patient's medical record from [facility] has been reviewed by the rehabilitation admission coordinator and physician.   Past Medical History  Past Medical History  Diagnosis Date  . Chronic kidney disease   . Type 1 diabetes   . Hypertension   . GERD (gastroesophageal reflux disease)     Family History  family history is negative for Colon cancer.  Prior Rehab/Hospitalizations: none   Current Medications See MAR  Patients Current Diet:  Renal diet with 1500 mL fluid restriction  Precautions / Restrictions Precautions Precautions: Fall Restrictions Weight Bearing Restrictions: No   Prior Activity Level Community (5-7x/wk): Pt was independent prior to this CVA and was on disability for renal failure,.He enjoyed playing golf every weekend.  Home Assistive Devices / Equipment Home Assistive Devices/Equipment: Bedside commode/3-in-1 (wife shared that they have lots  of bathroom equip from her CIR stay about 2 years ago)   Prior Functional Level Current Functional Level  Bed Mobility  Independent  Min assist   Transfers  Independent  Other (min assist x 2 with rolling walker, post. lean upon standing)   Mobility - Walk/Wheelchair  Independent  Other (Minimal  assist x 2 to amb. 44', needing facilitation of L LE)   Upper Body Dressing  Independent  Other (not assessed, anticipate needs)   Lower Body Dressing  Independent  Other (pt was able to tie his shoe very slowly and not tight)   Grooming  Independent  Other (set up assistance, weaker L grip)   Eating/Drinking  Independent  Other (set up assistance, weaker L grip)   Toilet Transfer  Independent  Other (not assessed at this time.)   Bladder Continence   pt is a dialysis pt.  dialysis pt, no urine   Bowel Management  WFL      Stair Climbing   WFL  Other (not tested)   Communication  [facility]  WFL. Pt has been documented to be irritable at times.   Memory  WFL  not formally assessed, appeared [facility]   Cooking/Meal Prep  Independent      Housework  Independent    Money Management  Independent    Driving   Independent     Special needs/care consideration BiPAP/CPAP no  CPM no  Continuous Drip IV no  Dialysis***        Days*** (Note pt will need his hemodialysis/peritoneal dialysis times coordinated with therapy schedule. Team is aware. I have been in communication with Dr. Lorrene Reid and Dr. Jimmy Footman, [facility] about pt's case.) Life Vest no  Oxygen no  Special Bed no  Trach Size no  Wound Vac (area) no       Skin - no issues                               Bowel mgmt:*** Bladder mgmt:*** Diabetic mgmt - managed at home with insulin  Previous Home Environment Living Arrangements: Spouse/significant other  Lives With: Spouse Available Help at Discharge: Family (son Matilde Haymaker and Elenor Legato can also help with his wife) Type of Home: House Home Layout: Multi-level;Able to live on main level with bedroom/bathroom Alternate Level Stairs-Rails:  (pt stays on main floor) Home Access: Stairs to enter Entrance Stairs-Number of Steps: 3 possible entrances with variable steps at each from 2-6 steps. Wife states they will have a ramp built. Bathroom Accessibility:  Yes (wife states the bathroom is handicap accessible after her CIR stay)  Discharge Living Setting Plans for Discharge Living Setting: Patient's home Type of Home at Discharge: House Discharge Home Layout: Multi-level;Able to live on main level with bedroom/bathroom Discharge Home Access: Stairs to enter (plan to have ramp built per wife) Technical brewer of Steps: variable steps at 3 different entrances (from 2-6 steps) (they plan to build a ramp per wife) Discharge Bathroom Accessibility: Yes  Social/Family/Support Systems Patient Roles: Spouse Contact Information: wife Darla is primary contact Anticipated Caregiver: wife, son and Aunt Anticipated Caregiver's Contact Information: see above Ability/Limitations of Caregiver: wife has some physical limiations herself following a SCI injury and previous CIR stay herself. She can provide supervision. Pt's son and Elenor Legato are also willing to help.  Caregiver Availability: 24/7 Discharge Plan Discussed with Primary Caregiver: Yes Is Caregiver In Agreement with Plan?: Yes Does Caregiver/Family have Issues with  Lodging/Transportation while Pt is in Rehab?: No  Goals/Additional Needs Patient/Family Goal for Rehab: Mod Ind with PT, OT and SLP Expected length of stay: 11-13 days Cultural Considerations: none Dietary Needs: renal diet, 1500 mL fluid restriction Equipment Needs: to be determined Special Service Needs: pt will need hemodialysis/Peritoneal dialysis times coordinated with therapy schedule Pt/Family Agrees to Admission and willing to participate: Yes Program Orientation Provided & Reviewed with Pt/Caregiver Including Roles  & Responsibilities: Yes  Patient Condition: I met with patient at [facility] on 11-11-13 to explain the possibility of inpatient rehab and also spoke with pt's wife by phone on several occasions. They are interested in pursuing inpatient rehab and are familiar with our rehab program as the  pt's wife was here for her own rehab in 2013. Prior to the new bilateral strokes, this 38 year old pt was on disability for renal failure but was independent in all aspects of life, including managing his own peritoneal dialysis treatments and he enjoyed playing golf on the weekends. He is currently needing minimal assistance with bed mobility and minimal assistance of 2 to ambulate 60'. He is demonstrating balance issues and needs assistance to facilitate his weaker left lower extremity. This patient is needing variable help with activities of daily living and has impairments with his left hand and upper extremity. The pt will benefit greatly from the multi-disciplinary team of skilled PT, OT, SLP and rehab nursing to maximize his recovery following these strokes. PT, OT and rehab nursing will focus on increasing strength for greater independence in bed mobility, transfers, gait and self care tasks. Rehab nursing can address pt/family medication education needs and assist with dialysis needs/education. Further high level speech therapy can determine if pt has any higher level cognitive changes following these strokes. In addition, pt will benefit from rehab physician intervention to monitor his dialysis needs/antibiotic needs in light of the CVAs and his previous issues associated with his peritoneal dialysis catheter. Discussed case with rehab team and pt is a good candidate for inpatient rehab. We received medical clearance from [facility]. Pt and his wife are motivated to come to inpatient rehab and will be admitted today on 11-14-13.  Preadmission Screen Completed By:  Nanetta Batty, PT, 11/11/2013 5:22 PM ______________________________________________________________________   Discussed status with Dr. [name] on *** at *** and received telephone approval for admission today.  Admission Coordinator: Sherlyn Hay Brenna Friesenhahn,PT , time ***/Date ***   Assessment/Plan: Diagnosis: 1. Does the need for close, 24 hr/day   Medical supervision in concert with the patient's rehab needs make it unreasonable for this patient to be served in a less intensive setting? {yes_no_potentially:3041433} 2. Co-Morbidities requiring supervision/potential complications: *** 3. Due to {due IB:7048889}, does the patient require 24 hr/day rehab nursing? {yes_no_potentially:3041433} 4. Does the patient require coordinated care of a physician, rehab nurse, {coordinated VQXI:5038882} to address physical and functional deficits in the context of the above medical diagnosis(es)? {yes_no_potentially:3041433} Addressing deficits in the following areas: {deficits:3041436} 5. Can the patient actively participate in an intensive therapy program of at least 3 hrs of therapy 5 days a week? {yes_no_potentially:3041433} 6. The potential for patient to make measurable gains while on inpatient rehab is {potential:3041437} 7. Anticipated functional outcomes upon discharge from inpatients are: {functional outcomes:304600100} PT, {functional outcomes:304600100} OT, {functional outcomes:304600100} SLP 8. Estimated rehab length of stay to reach the above functional goals is: *** 9. Does the patient have adequate social supports to accommodate these discharge functional goals? {yes_no_potentially:3041433} 10. Anticipated D/C setting: {anticipated dc  setting:21604} 11. Anticipated post D/C treatments: {post dc treatment:21605} 12. Overall Rehab/Functional Prognosis: {potential:3041437}    RECOMMENDATIONS: This patient's condition is appropriate for continued rehabilitative care in the following setting: {appropriate setting:21606} Patient has agreed to participate in recommended program. {yes_no_potentially:3041433} Note that insurance prior authorization may be required for reimbursement for recommended care.  Comment:  Lindalee Huizinga,PT 11/11/2013

## 2013-11-15 LAB — BODY FLUID CULTURE

## 2013-11-16 ENCOUNTER — Ambulatory Visit: Payer: Self-pay | Admitting: Vascular Surgery

## 2014-04-04 ENCOUNTER — Inpatient Hospital Stay: Payer: Self-pay | Admitting: Internal Medicine

## 2014-05-19 ENCOUNTER — Ambulatory Visit: Payer: Self-pay | Admitting: Vascular Surgery

## 2014-06-16 NOTE — Op Note (Signed)
PATIENT NAME:  [name], [name] DATE OF BIRTH:  January 21, 1976  DATE OF PROCEDURE:  01/18/2013  PREOPERATIVE DIAGNOSIS: Poor function of dialysis catheter.   POSTOPERATIVE DIAGNOSIS:  Poor function of dialysis catheter.  PROCEDURE PERFORMED:  TPA infusion for salvage of dialysis catheter.   SURGEON: Levora DredgeGregory Daijah Scrivens, M.D.   DESCRIPTION OF PROCEDURE: The patient is brought to special procedures. Two aliquots of tPA, 2 mg in 50 mL, are reconstituted and infusion simultaneously through both lumens is performed over 2-1/2 to 3 hours.   Following completion of the tPA infusion, both lumens aspirate easily and are then packed by myself with 5000 units of heparin per 2 mL. The patient tolerated the procedure well and there were no immediate complications.    ____________________________ Renford DillsGregory G. Lachrisha Ziebarth, MD ggs:dmm D: 01/19/2013 10:42:23 ET T: 01/19/2013 11:44:08 ET JOB#: [id number]  cc: Renford DillsGregory G. Dianelys Scinto, MD, <Dictator> Renford DillsGREGORY G Sharma Lawrance MD ELECTRONICALLY SIGNED 01/31/2013 17:20

## 2014-06-16 NOTE — Op Note (Signed)
PATIENT NAME:  [name], [name] MR#:  [id number] DATE OF BIRTH:  1976-01-04  DATE OF PROCEDURE:  12/08/2012  PREOPERATIVE DIAGNOSES:   1.  End-stage renal disease.  2.  Hypertension.  POSTOPERATIVE DIAGNOSES: 1.  End-stage renal disease.  2.  Hypertension.  PROCEDURES: 1.  Ultrasound guidance for vascular access to right internal jugular vein.  2.  Fluoroscopic guidance for placement of catheter.  3. Placement of a 19 cm tip-to-cuff tunneled hemodialysis catheter via the right internal jugular vein.   SURGEON:  Festus BarrenJason Gloriana Piltz, MD  ANESTHESIA: Local with moderate conscious sedation.   BLOOD LOSS: Minimal.   INDICATION FOR PROCEDURE: A 39 year old, African-American male with progression end-stage renal disease after chronic kidney disease. He has significant hypertension. PermCath is placed for initiation of dialysis.   DESCRIPTION OF THE PROCEDURE: The patient was brought to the vascular and interventional radiology suite. The patient's right neck and chest were sterilely prepped and draped and a sterile surgical field was created. The right internal jugular vein was visualized with ultrasound and found to be patent. It was then accessed under direct ultrasound guidance and a permanent image was recorded. A wire was placed. After a skin nick and dilatation, the peel-away sheath was placed over the wire.   I then turned my attention to an area under the clavicle. Approximately 2 fingerbreadths below the clavicle a small counter incision was created and we tunneled from the subclavicular incision to the access site. Using fluoroscopic guidance, a 19 cm tip-to-cuff tunneled hemodialysis catheter was selected, tunneled from the subclavicular incision to the access site. It was then placed through the peel-away sheath and the peel-away sheath was removed. The catheter tips were parked in the right atrium. The appropriate distal connectors were placed. It withdrew blood well and flushed easily with  heparinized saline and a concentrated heparin solution was then placed. It was secured to the chest wall with 2 Prolene sutures. The access incision was closed with a single 4-0 Monocryl. A 4-0 Monocryl pursestring suture was placed around the exit site. Sterile dressings were placed.   The patient tolerated the procedure well and was taken to the recovery room in stable condition.     ____________________________ Annice NeedyJason S. Linnet Bottari, MD jsd:mr D: 12/08/2012 17:09:55 ET T: 12/08/2012 19:40:21 ET JOB#: [id number]  cc: Annice NeedyJason S. Dekota Kirlin, MD, <Dictator> Annice NeedyJASON S Naina Sleeper MD ELECTRONICALLY SIGNED 12/15/2012 10:27

## 2014-06-16 NOTE — Consult Note (Signed)
PATIENT NAME:  [name], [name] MR#:  [id number] DATE OF BIRTH:  1975/07/16  DATE OF CONSULTATION:    REFERRING PHYSICIAN: Abel Presto, MD CONSULTING PHYSICIAN:  Kailynne Ferrington Lilian Kapur, MD  REASON FOR CONSULTATION: Severe renal insufficiency, hyperkalemia, anemia.   HISTORY OF PRESENT ILLNESS: The patient is a very pleasant 39 year old African American male with past medical history of long-standing diabetes mellitus, type 2, greater than 20 years; hypertension; and obesity who presented to [facility] today with abnormal labs.   The patient reports to me that he went to his eye doctor relatively recently. He was noted as having significantly high blood pressure with a systolic blood pressure of 220. He was advised to go to [facility]. When he arrived there, his blood pressure was found again to be high and he had labs performed. It appears that on these labs, he had severe renal insufficiency, and the patient was advised to come to the Emergency Department here at [facility] for further evaluation.   We found that his creatinine was 8.9 with a BUN of 49. EGFR was quite low at 9. We do not have baseline labs available to [place] at this point in time as he has had lack of healthcare recently. The patient has been taking care of  his parents over the past several years. His mother just passed away in 12/01/22. She had history of CVA and died at the [facility]. The patient's father was troubled with Leta Baptist disease.   The patient has significant proteinuria with a urine protein greater than 500 mg/dL. There was also significant glucosuria. The patient also appeared to have anemia with a hemoglobin of 11.2. He also appears to be hypocalcemic with a serum calcium of 7.4. These are all signs that his disease has some chronicity.   In addition, the patient had significantly high blood pressure here with a systolic blood pressure of 220.   After  initial evaluation, Dr. Verdell Carmine called [place] to evaluate the patient. He reports that he has been having early morning nausea for at least the past 4 months. He has also had vomiting. He denies dysgeusia. He has mild lower extremity edema. He also reports increasing fatigue recently.   PAST MEDICAL HISTORY:  1.  Long-standing diabetes melitis for approximately 20 years.  2.  Hypertension.  3.  Obesity.   PAST SURGICAL HISTORY: None.   HOME MEDICATIONS: Include omeprazole 20 mg p.o. daily, regular insulin 20 units b.i.d., hydrochlorothiazide 25 mg daily, Novolin N 20 units subcutaneous b.i.d.   SOCIAL HISTORY: The patient resides in [place]. He is currently unemployed. He used to work for [employer]. He smokes 8 cigarettes per day. He denies alcohol or illicit drug use.   FAMILY HISTORY: The patient's mother recently deceased and had history of CVA. She did not appear to have any history of renal insufficiency per his report. The patient's father had Leta Baptist disease.   REVIEW OF SYSTEMS: CONSTITUTIONAL: Denies fevers, chills, weight loss.  EYES: He has had some disturbance with his vision recently. That is why he went to see an optometrist.  HEENT: Denies headaches at present. Denies hearing loss, tinnitus, epistaxis, sore throat.  CARDIOVASCULAR: Denies chest pain, palpitations, PND.  RESPIRATORY: Denies cough, shortness of breath, or hemoptysis.  GASTROINTESTINAL: Reports early morning nausea and vomiting but denies poor appetite.  GENITOURINARY: Denies frequency, urgency. Reports a good urine stream.  MUSCULOSKELETAL: Denies joint pain, swelling, or redness.  INTEGUMENTARY: Denies  skin rashes or lesions.  NEUROLOGIC: Denies focal extremity numbness, weakness, or tingling.  PSYCHIATRIC: Denies depression, bipolar disorder.  ENDOCRINE: Has long-standing history of diabetes mellitus greater than 20 years.  LYMPHATIC: Denies easy bruisability, bleeding, or swollen lymph  nodes.  ALLERGY AND IMMUNOLOGIC: Denies seasonal allergies or history of immunodeficiency.   PHYSICAL EXAMINATION:  VITAL SIGNS: Temperature 98.2, pulse 86, respirations 18, blood pressure 215/126.  GENERAL: Well-developed, well-nourished African American male who appears his stated age, currently in no acute distress.  HEENT: Normocephalic, atraumatic. Extraocular movements are intact. Pupils equal, round, reactive to light. No scleral icterus. Conjunctivae are pink. No epistaxis noted. Gross hearing intact. Oral mucosa moist.  NECK: Supple and without JVD or lymphadenopathy.  LUNGS: Clear to auscultation bilaterally with normal respiratory effort.  HEART: S1, S2, regular rate and rhythm. No murmurs, rubs, or gallops appreciated.  ABDOMEN: Obese, soft, nontender, nondistended. Bowel sounds positive. No rebound or guarding. No gross organomegaly appreciated.  EXTREMITIES: No clubbing or cyanosis noted and 1+ lower extremity edema noted.  NEUROLOGIC: Alert and oriented to time, person, and place. Strength is five out of five in both upper and lower extremities. Sensation appears to be intact.  MUSCULOSKELETAL: No joint redness, swelling or tenderness appreciated.  GENITOURINARY: No suprapubic tenderness is noted at this time.  PSYCHIATRIC: The patient is with appropriate affect and appears to have good insight into his current illness.   LABORATORY DATA: Sodium 136, potassium 5.3, chloride 106, CO2 23, BUN 49, creatinine 8.19, glucose 345, total protein 7.8, albumin 2.9, total bilirubin 0.1, alkaline phosphatase 116, AST 29, ALT 30. CBC shows WBC 8.7, hemoglobin 11.2, hematocrit 33, platelets 402. Urine glucose greater than 500 mg/dL, protein greater than 500 mg/dL, 1 RBC per high-power field, 1 WBC per high-power field.   IMPRESSION: This is a 38 year old African American male with past medical history of hypertension, diabetes mellitus of long-standing greater than 20 years, GERD, obesity, who  presented to [facility] with abnormal labs including severely elevated creatinine of 8.9, EGFR of 9, anemia, hypocalcemia, and early morning nausea.   PROBLEM LIST:  1.  Suspected end-stage renal disease.  2.  Mild hyperkalemia, serum potassium 5.3.  3.  Hypocalcemia.  4.  Hypoalbuminemia.  5.  Anemia, suspected of chronic kidney disease.  6.  Proteinuria.  7.  Long-standing diabetes mellitus.  8.  Malignant hypertension.   ASSESSMENT: Based upon history, it appears that the patient has long-standing renal insufficiency though this is not documented by lab work. He has had early morning nausea and vomiting for at least the past 4 months. He has some elements that suggest chronicity. This is suggested by anemia with a hemoglobin of 11.2. He also has significant proteinuria with a urine protein greater than 500 mg/dL. He has history of long-standing diabetes mellitus for approximately 20 years and also has malignant hypertension. These are significant risk factors for chronic kidney disease.   At this point in time, it appears that the patient is in need of renal replacement therapy. We had a long discussion today about how to proceed. We have described both peritoneal dialysis as well as hemodialysis to the patient. He is currently undecided but seems to like the idea of peritoneal dialysis. However, at present there is a Tree surgeon of peritoneal dialysis fluid, and therefore he may likely need to be started on hemodialysis. We will consult vascular surgery for this in the a.m. and start with his first hemodialysis treatment.   We will  proceed with further work-up including SPEP, UPEP, ANA, ANC antibodies,  GBM antibodies, C3, C4, hepatitis serologies, HIV screen, and RPR.   At this point in time, we would not recommend a renal biopsy given the severity of renal dysfunction as we would likely find scar. We will obtain a renal ultrasound for further evaluation as well.    In regards to his malignant hypertension, I agree with institution of metoprolol as well as clonidine. We could also consider amlodipine. The patient is also being provided p.r.n. hydralazine for elevated blood pressure. The patient is from [place] and we will likely need to begin to search for dialysis units in the [place] area versus [place].   I would like to thank Dr. Verdell Carmine for this kind referral.   Further plan as the patient progresses.   ____________________________ Tama High, MD mnl:np D: 12/07/2012 16:53:31 ET T: 12/07/2012 17:30:35 ET JOB#: [id number]  cc: Tama High, MD, <Dictator> Mariah Milling Karene Bracken MD ELECTRONICALLY SIGNED 01/12/2013 10:02

## 2014-06-16 NOTE — H&P (Signed)
PATIENT NAME:  [name], GUIA MR#:  [id number] DATE OF BIRTH:  17-Mar-1975  DATE OF ADMISSION:  12/07/2012  PRIMARY CARE PHYSICIAN: He does not have one.   CHIEF COMPLAINT: Significantly elevated blood pressures and elevated creatinine.   HISTORY OF PRESENT ILLNESS: This is a 39 year old male who presents to the hospital, referred from [place] family practice after some abnormal blood work that was done a few days back. The patient says that he went to get his vision checked, that he had been having some blurry vision, at [facility] family practice. The patient was noted to be significantly hypertensive there and therefore was started on just some oral hydrochlorothiazide. They did some blood work on him and called him today to come urgently to the Emergency Room. The patient presented to the ER and was noted to have a creatinine as high as 8. I do not have a baseline creatinine to compare with. The patient was also noted to have significantly elevated blood pressures with systolic blood pressures in the 190s and diastolics over 100s. Hospitalist services were contacted for further treatment and evaluation.   The patient denies any chest pain, any shortness of breath, any nausea, vomiting, abdominal pain, fevers, chills, cough or any other associated symptoms. The patient says that he has been urinating normally with no dysuria, no hematuria and no other symptoms presently.   REVIEW OF SYSTEMS:    CONSTITUTIONAL: No documented fever. No weight gain, no weight loss.  EYES: No blurred or double vision.  EARS, NOSE, THROAT: No tinnitus. No postnasal drip. No redness of the oropharynx.  RESPIRATORY: No cough, no wheeze, no hemoptysis, no dyspnea.  CARDIOVASCULAR: No chest pain, no orthopnea, no palpitations, no syncope.  GASTROINTESTINAL: No nausea, no vomiting, no diarrhea, no abdominal pain, no melena or hematochezia.  GENITOURINARY: No dysuria, no hematuria.  ENDOCRINE: No polyuria or nocturia, heat  or cold intolerance.  HEMATOLOGIC: No anemia, no bruising, no bleeding.  INTEGUMENTARY: No rashes. No lesions.  MUSCULOSKELETAL: No arthritis. No swelling. No gout.  NEUROLOGIC: No numbness. No tingling. No ataxia. No seizure-type activity.  PSYCHIATRIC: No anxiety. No insomnia. No ADD.   PAST MEDICAL HISTORY: Consistent with diabetes and hypertension.   ALLERGIES: No known drug allergies.   SOCIAL HISTORY: Does smoke about 8 cigarettes per day, has been smoking for the past 13 years. No alcohol abuse. No illicit drug abuse. Lives by himself.   FAMILY HISTORY: Both mother and father are deceased. Mother died from complications of a stroke. Father died from ALS.   CURRENT MEDICATIONS: Omeprazole 20 mg daily, HCTZ 25 mg daily, Novolin N 20 units b.i.d. and regular insulin 20 units b.i.d.   PHYSICAL EXAMINATION: VITAL SIGNS: Presently, temperature is 98.2, pulse 83, respirations 18, blood pressure 217/118, sats 98% on room air.  GENERAL: He is a pleasant-appearing male in no apparent distress.  HEAD, EYES, EARS, NOSE AND THROAT: The patient is atraumatic, normocephalic. Extraocular muscles are intact. Pupils are equal and reactive to light. Sclerae anicteric. No conjunctival injection. No pharyngeal erythema.  NECK: Supple. There is no jugular venous distention. No bruits, no lymphadenopathy, no thyromegaly.  HEART: Regular rate and rhythm. No murmurs, no rubs, no clicks.  LUNGS: Clear to auscultation bilaterally. No rales, no rhonchi, no wheezes.  ABDOMEN: Soft, flat, nontender, nondistended. Has good bowel sounds. No hepatosplenomegaly appreciated.  EXTREMITIES: No evidence of any cyanosis, clubbing or peripheral edema. Has +2 pedal and radial pulses bilaterally.  NEUROLOGICAL: The patient is alert, awake, oriented x  3 with no focal motor or sensory deficits appreciated bilaterally.  SKIN: Moist and warm with no rashes appreciated.  LYMPHATIC: There is no cervical or axillary  lymphadenopathy.   LABORATORY DATA: Serum glucose of 245, BUN 49, creatinine 8.19, sodium 136, potassium 5.3, chloride 106, bicarbonate 23, albumin 2.9. LFTs are within normal limits. White cell count 8.7, hemoglobin 11.2, hematocrit 33, platelet count 402. Urinalysis shows glucosuria and also proteinuria.   ASSESSMENT AND PLAN: This is a 39 year old male with a history of diabetes, hypertension, who presents to the hospital due to significantly elevated blood pressures and also noted to be in acute renal failure.  1.  Acute renal failure: The exact etiology of the acute renal failure is unclear, but suspected to be due to hypertensive renal disease given his uncontrolled malignant hypertension. I do not have a baseline creatinine to compare with. The urinalysis does show proteinuria. I will get a urine protein/creatinine ratio. Would also get a renal ultrasound. I did discuss the case with nephrology with Dr. Cherylann RatelLateef, who will see the patient later today. There is no urgent indication for dialysis at this time. Will go ahead and follow his BUN and creatinine, renal dose meds and avoid nephrotoxins for now. 2.  Malignant hypertension: The patient has not taking any meds for a long period of time. He likely has undiagnosed hypertension. He was just recently started on HCTZ only a few days back. For now, will start the patient on some oral clonidine and metoprolol, also place him on p.r.n. hydralazine and follow his hemodynamics.  3.  Diabetes: Place him on sliding scale insulin and Levemir daily. Follow blood sugars. Check a hemoglobin A1c. 4.  Hyperkalemia: I suspect this is secondary to the acute renal failure. It is mild in nature. The patient has no acute EKG changes. Will follow his serial potassium closely.   CODE STATUS: The patient is a full code.   TIME SPENT ON ADMISSION: 50 minutes.   ____________________________ Rolly PancakeVivek J. Cherlynn KaiserSainani, MD vjs:jm D: 12/07/2012 15:49:00 ET T: 12/07/2012 16:15:45  ET JOB#: [id number]  cc: Rolly PancakeVivek J. Cherlynn KaiserSainani, MD, <Dictator> Houston SirenVIVEK J Jennafer Gladue MD ELECTRONICALLY SIGNED 12/22/2012 14:32

## 2014-06-16 NOTE — Discharge Summary (Signed)
PATIENT NAME:  [name], [name] MR#:  [id number] DATE OF BIRTH:  [birth year]  DATE OF ADMISSION:  12/07/2012 DATE OF DISCHARGE:  12/11/2012  DISCHARGE DIAGNOSES: 1. Malignant hypertension.  2. 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  3. Diabetes mellitus type 2.  4. Resolved hyperkalemia.   CONSULTANTS:  1.  Nephrology consult with Dr. Shirlyn GoltzMunsorr Lateef. 2.  Vascular consult with Dr. Wyn Quakerew.  PROCEDURES: Placement of hemodialysis catheter in right IJ.   DISCHARGE MEDICATIONS:  1. Omeprazole 20 mg p.o. daily.  2. Enalapril 10 mg p.o. daily.  3. Clonidine 0.3 mg p.o. b.i.d.  4. Metoprolol 25 mg p.o. q.6 hours.  5. Amlodipine 10 mg p.o. daily.  6. Insulin (regular insulin) 15 units b.i.d. with meals.  7. NPH 15 units q. 12 hours in the morning and also 12 units at night.   DIET: Renal diet.   FOLLOWUPS: With Nephrology on October 21 at 9:00 a.m[facility] his [facility] hemod[facility].     HOSPITAL COURSE:  1. This is a 39 year old male patient who has a history of hypertension, diabetes sent from his family practice office because of elevated creatinine of 8. The patient also noted to have elevated blood pressure at systolic 190 over diastolic 100. The patient did not have any symptoms. The patient on admission, blood pressure 217/118, admitted for malignant hypertension. The patient just started on hydrochlorothiazide a few days before and his blood pressure has been controlled with multiple oral hypertensive medication including Norvasc, metoprolol, and Enalapril, and clonidine. Discharge blood pressure 140/90, heart rate 73, saturation 94% on room air. 2. ESRD. The patient has a long-standing history of diabetes mellitus type 2 for almost 20 years and hypertension. The patient had GFR of 9, creatinine 8.9 and BUN of 49, and the patient does not have any baseline; and the patient seen by Nephrology, had significant proteinuria with urine  protein greater than 500 mg/dL. The patient was found to have chronic renal failure, also was suspected to have ESRD and potassium was 5.3, which was slightly elevated. The patient was started on hemodialysis and the patient had an IJ access by Dr. Wyn Quakerew and had dialysis cath,first   dialysis was on 15th. The patient's hemodialysis was started on 12/08/2012. He received hemodialysis on 15th, 16th, 17th, and 18th; and the patient's creatinine on 12/09/2012, 8.7 and BUN of 59, potassium is improved to 4.1. (  the patient has been accepted at outpatient dialysis center at [facility] ,  to start on Tuesday. I spoke with Dr. Wynelle LinkKolluru. He suggested that the patient can be discharged and follow up with him on Tuesday. The patient was found to have ESRD secondary to focal segmental glomerulosclerosis from long-standing hypertension, diabetes. The patient's malignant hypertension improved.  3. For his diabetes, he is on NPH and regular insulin. The patient did not have any biopsy because of his severe kidney dysfunction,  so the patient had a renal ultrasound, which showed no obstructive uropathy. Hepatitis B panel is negative.  His PTH is 458.   4. .  The patient is discharged home in a stable condition.   TIME SPENT: More than 30 days minutes on discharge preparation. Advised him to be compliant with his medications and also with his dialysis.   ____________________________ Katha HammingSnehalatha Jim Philemon, MD sk:sg D: 12/12/2012 07:44:38 ET T: 12/12/2012 10:18:14 ET JOB#: [id number]  cc: Katha HammingSnehalatha Murline Weigel, MD, <Dictator> Katha HammingSNEHALATHA Maryagnes Carrasco MD ELECTRONICALLY SIGNED 12/25/2012 22:12

## 2014-06-17 NOTE — Consult Note (Signed)
Referring Physician:  Houston Siren   Primary Care Physician:  [facility], [address] [place], [place] [address], [place] [phone]  Reason for Consult: Admit Date: 01-Nov-2013  Chief Complaint: dialysis cathater not working  Reason for Consult: CVA   History of Present Illness: History of Present Illness:   39 yo RHD M presents to [facility] secondary to dialysis cathater not working.  Yesterday pt developed some shaking episodes as well as confusion so neurology was consulted.  Per family, today he had some L facial droop and alternating shaking.  Per chart, pt had shaking all over and was confused but a little bit better today.  ROS:  Review of Systems   difficult to obtain secondary to lethargy  Past Medical/Surgical Hx:  peritoneal dialysis:   ESRD  TTS:   reflux:   HTN:   diabetes:   Past Medical/ Surgical Hx:  Past Medical History reviewed by me as above   Past Surgical History reviewed by me as above   Home Medications: Medication Instructions Last Modified Date/Time  cloNIDine 0.3 mg oral tablet 1 tab(s) orally 2 times a day 08-Sep-15 17:50  NovoLIN R human recombinant 100 units/mL injectable solution 15 unit(s) subcutaneous 2 times a day 08-Sep-15 17:50  NovoLIN N human recombinant 100 units/mL subcutaneous suspension 15 unit(s) subcutaneous 2 times a day 08-Sep-15 17:50  furosemide 80 mg oral tablet 1 tab(s) orally once a day 08-Sep-15 17:50  carvedilol 25 mg oral tablet 1 tab(s) orally 2 times a day 08-Sep-15 17:50  hydrALAZINE 50 mg oral tablet  orally 2 times a day 08-Sep-15 17:48  metoprolol 100 mg oral tablet 1 tab(s) orally 2 times a day 08-Sep-15 17:50  amLODIPine 10 mg oral tablet 1 tab(s) orally once a day 08-Sep-15 17:50   Allergies:  No Known Allergies:   Allergies:  Allergies NKDA   Social/Family History: Employment Status: disabled  Lives With: domestic partner  Living Arrangements: apartment  Social  History: no tob, no EtOH, no illicts  Family History: no stroke, no seizure   Vital Signs: **Vital Signs.:   12-Sep-15 04:52  Vital Signs Type Routine  Temperature Temperature (F) 98.9  Celsius 37.1  Temperature Source oral  Pulse Pulse 74  Respirations Respirations 20  Systolic BP Systolic BP 127  Diastolic BP (mmHg) Diastolic BP (mmHg) 72  Mean BP 90  Pulse Ox % Pulse Ox % 98  Pulse Ox Activity Level  At rest  Oxygen Delivery Room Air/ 21 %   Physical Exam: General: overweight, NAD, in dialysis  HEENT: normocephalic, sclera nonicteric, oropharynx clear  Neck: supple, no JVD, no bruits  Chest: CTA B, no wheezing, good movement  Cardiac: RRR, no murmurs, no edema, 2+ pulses  Extremities: no C/C/E, FROM   Neurologic Exam: Mental Status: lethargic but oriented x 3, follows commands but mild dysarthria, poor complex thinking  Cranial Nerves: PERRLA, EOMI, nl VF, face symmetric, tongue midline, shoulder shrug equal  Motor Exam: 4/5 L, 5/5 R, mild myoclonus noted but no asterixis, nl tone  Deep Tendon Reflexes: 1+/4 B, mute plantars  Sensory Exam: pinprick, temperature, and vibration intact B  Coordination: FTN and HTS WNL, nl RAM, nl gait   Lab Results: Hepatic:  08-Sep-15 12:12   Bilirubin, Total 0.4  Alkaline Phosphatase 80 (46-116 NOTE: New Reference Range 09/13/13)  SGPT (ALT) 33 (14-63 NOTE: New Reference Range 09/13/13)  SGOT (AST)  42  Total Protein, Serum 7.3  12-Sep-15 09:58  Albumin, Serum  1.7  Routine Micro:  11-Sep-15 17:07   Micro Text Report URINE CULTURE   COMMENT                   NO GROWTH IN 8-12 HOURS   ANTIBIOTIC                       Specimen Source IN AND OUT CATH  Culture Comment NO GROWTH IN 8-12 HOURS  Result(s) reported on 05 Nov 2013 at 11:05AM.  General Ref:  09-Sep-15 18:48   HBsAg ========== TEST NAME ==========  ========= RESULTS =========  = REFERENCE RANGE =  HEPATITIS B SURFACE AG  HBsAg Screen HBsAg Screen                     [   Negative             ]          Negative               [facility]            No: [id number]           [address], [place], [place] [address]           Lindon Romp, MD         [phone]   Result(s) reported on 03 Nov 2013 at 05:55AM.  Hepatitis B Surface Antibody, Qual ========== TEST NAME ==========  ========= RESULTS =========  = REFERENCE RANGE =  HEPATITIS B SURF.AB,QUAL  Hep B Surface Ab Hep B Surface Ab, Qual          [   Reactive             ]                                                Non Reactive: Inconsistent with immunity,                                            less than 10 mIU/mL                              Reactive:     Consistent with immunity,                                            greater than 9.9 mIU/mL               [facility]            No: [id number]           [address], [place], [place] [address]           Lindon Romp, MD         [phone]   Result(s) reported on 03 Nov 2013 at 05:55AM.  10-Sep-15 05:18   PTH Intact with Calcium ========== TEST NAME ==========  ========= RESULTS =========  = REFERENCE RANGE =  PTH-INTACT PLUS CALCIUM  Test(s) Calcium, Serum called to  LT Floyce Stakes on 11/04/2013 at 02:45 EST Ca+PTH Intact Calcium, Serum                  [L  6.4 mg/dL         ]          8.7-10.2 **Verified by repeat analysis** PTH, Intact                     [H  426 pg/mL            ]             15-65 Intact PTH                      [   Final Report         ]                                  Interpretation              Intact PTH    Calcium                                                (pg/mL)      (mg/dL)                Normal                          15 - 65     8.6 - 10.2                Primary Hyperparathyroidism         >65          >10.2             Secondary Hyperparathyroidism       >65          <10.2                Non-Parathyroid Hypercalcemia       <65          >10.2                 Hypoparathyroidism                  <15          < 8.6                Non-Parathyroid Hypocalcemia    15 - 65          < 8.6               [facility]            No: [id number]           [address] [place], [place] [address]           Lindon Romp, MD         [phone]   Result(s) reported on 04 Nov 2013 at 09:19AM.  Routine Chem:  09-Sep-15 04:46   Result Comment CALCIUM - RESULTS VERIFIED BY REPEAT TESTING.  - NOTIFIED OF CRITICAL VALUE  - C/ CARMEN HERRERA $RemoveBefore'@0535'PaYItoeYmjSTh$  11-02-13.Marland KitchenAJO  - READ-BACK PROCESS PERFORMED.  Result(s) reported on  02 Nov 2013 at 05:30AM.  12-Sep-15 09:58   Glucose, Serum  325  BUN  41  Creatinine (comp)  12.11  Sodium, Serum  132  Potassium, Serum 4.5  Chloride, Serum  94  CO2, Serum 23  Calcium (Total), Serum  7.4  Phosphorus, Serum  7.1  Anion Gap 15  Osmolality (calc) 287  eGFR (African American)  5  eGFR (Non-African American)  5 (eGFR values <58mL/min/1.73 m2 may be an indication of chronic kidney disease (CKD). Calculated eGFR is useful in patients with stable renal function. The eGFR calculation will not be reliable in acutely ill patients when serum creatinine is changing rapidly. It is not useful in  patients on dialysis. The eGFR calculation may not be applicable to patients at the low and high extremes of body sizes, pregnant women, and vegetarians.)  Urine Drugs:  [id number] 38:75   Tricyclic Antidepressant, Ur Qual (comp) NEGATIVE (Result(s) reported on 04 Nov 2013 at 05:20PM.)  Amphetamines, Urine Qual. NEGATIVE  MDMA, Urine Qual. NEGATIVE  Cocaine Metabolite, Urine Qual. NEGATIVE  Opiate, Urine qual NEGATIVE  Phencyclidine, Urine Qual. NEGATIVE  Cannabinoid, Urine Qual. NEGATIVE  Barbiturates, Urine Qual. NEGATIVE  Benzodiazepine, Urine Qual. POSITIVE (----------------- The URINE DRUG SCREEN provides only a preliminary, unconfirmed analytical test result and should not be used for non-medical  purposes.   Clinical consideration and professional judgment should be  applied to any positive drug screen result due to possible interfering substances.  A more specific alternate chemical method must be used in order to obtain a confirmed analytical result.  Gas chromatography/mass spectrometry (GC/MS) is the preferred confirmatory method.)  Methadone, Urine Qual. NEGATIVE  Routine Hem:  12-Sep-15 09:58   WBC (CBC)  14.4  RBC (CBC)  2.72  Hemoglobin (CBC)  7.6  Hematocrit (CBC)  24.1  Platelet Count (CBC) 434  MCV 89  MCH 28.0  MCHC  31.6  RDW  15.8  Neutrophil % 86.3  Lymphocyte % 7.1  Monocyte % 5.7  Eosinophil % 0.3  Basophil % 0.6  Neutrophil #  12.4  Lymphocyte # 1.0  Monocyte # 0.8  Eosinophil # 0.0  Basophil # 0.1 (Result(s) reported on 05 Nov 2013 at 10:31AM.)   Radiology Results: CT:    11-Sep-15 14:20, CT Head Without Contrast  CT Head Without Contrast   REASON FOR EXAM:    fall, ams  COMMENTS:       PROCEDURE: CT  - CT HEAD WITHOUT CONTRAST  - Nov 04 2013  2:20PM     CLINICAL DATA:  Fall, altered mental status    EXAM:  CT HEAD WITHOUT CONTRAST    TECHNIQUE:  Contiguous axial images were obtained from the base of the skull  through the vertex without intravenous contrast.    COMPARISON:  None.  FINDINGS:  There is no evidence of mass effect, midline shift, or extra-axial  fluid collections. There is no evidence of a space-occupying lesion  or intracranial hemorrhage. There is no evidence of a cortical-based  area of acute infarction.    The ventricles and sulci are appropriate for the patient's age. The  basal cisterns are patent.    Visualized portions of the orbits are unremarkable. Small mucous  retention cyst in the left sphenoid sinus. Bilateral mild maxillary  sinus mucosal thickening.    The osseous structures are unremarkable.   IMPRESSION:  No acute intracranial pathology.      Electronically Signed    By: Kathreen Devoid    On:  11/04/2013  14:36         Verified By: Jennette Banker, M.D., MD   Radiology Impression: Radiology Impression: CT of head personally reviewed by me and shows mild white matter disease   Impression/Recommendations: Recommendations:   prior notes reviewed by me reviewed by me   Encephalopathy-  this is cause of flucutating confusion as well as tremors;  this will likely continue to fluctuate until cause is reversed.  Etiology at this time could be multifactorial to include infections and renal dysfunction;  we need to r/o other causes. L sided weakness-  mild and focal weakness is not typically seen in 1. but is possible;  must r/o stroke but a seizure with Todds paralysis could do this as well EEG not available until monday MRI of brain w/o contrast check B12, ESR, ABG, liver, ammonia, TSH continue to treat infections continue dialysis avoid sedating medications will follow  Electronic Signatures: Jamison Neighbor (MD)  (Signed 12-Sep-15 12:59)  Authored: REFERRING PHYSICIAN, Primary Care Physician, Consult, History of Present Illness, Review of Systems, PAST MEDICAL/SURGICAL HISTORY, HOME MEDICATIONS, ALLERGIES, Social/Family History, NURSING VITAL SIGNS, Physical Exam-, LAB RESULTS, RADIOLOGY RESULTS, Recommendations   Last Updated: 12-Sep-15 12:59 by Jamison Neighbor (MD)

## 2014-06-17 NOTE — Consult Note (Signed)
General Aspect complication of dialysis device with poor return from the PD cath   Present Illness The patient is a 39 year old male who presents to the hospital due to low flow with poor exchanges from his peritoneal dialysis. The patient says that his flows from his PD catheter have been on the low side for over one week.  He was seen at his dialysis center who sent him to the ER for further evaluation. In the Emergency Room, the patient was noted to have a potassium of 6.5. He had no EKG changes, labs were consistent with hyperkalemia, and he had no arrhythmias. Given his clotted PD cath and ineffective exchanges and hyperkalemia he was admitted.  PAST MEDICAL HISTORY: Consistent with end-stage renal disease, on peritoneal dialysis, hypertension and diabetes.   Home Medications: Medication Instructions Status  cloNIDine 0.3 mg oral tablet 1 tab(s) orally 2 times a day Active  NovoLIN R human recombinant 100 units/mL injectable solution 15 unit(s) subcutaneous 2 times a day Active  NovoLIN N human recombinant 100 units/mL subcutaneous suspension 15 unit(s) subcutaneous 2 times a day Active  furosemide 80 mg oral tablet 1 tab(s) orally once a day Active  carvedilol 25 mg oral tablet 1 tab(s) orally 2 times a day Active  hydrALAZINE 50 mg oral tablet  orally 2 times a day Active  metoprolol 100 mg oral tablet 1 tab(s) orally 2 times a day Active  amLODIPine 10 mg oral tablet 1 tab(s) orally once a day Active    No Known Allergies:   Case History:  Family History Non-Contributory   Social History negative tobacco, negative ETOH, negative Illicit drugs   Review of Systems:  Fever/Chills No   Cough No   Sputum No   Abdominal Pain No   Diarrhea No   Constipation No   Nausea/Vomiting No   SOB/DOE No   Chest Pain No   Telemetry Reviewed NSR   Dysuria No   Physical Exam:  GEN well developed, well nourished, no acute distress   HEENT hearing intact to voice, moist oral  mucosa, poor dentition   NECK supple  trachea midline   RESP normal resp effort  no use of accessory muscles   CARD regular rate  no JVD   ABD denies tenderness  soft  PD cath present no drainage no redness no tenderness   EXTR negative cyanosis/clubbing, negative edema   SKIN No rashes, No ulcers   NEURO cranial nerves intact, follows commands, motor/sensory function intact   PSYCH alert, A+O to time, place, person, poor insight   Nursing/Ancillary Notes: **Vital Signs.:   08-Sep-15 17:22  Vital Signs Type Admission  Temperature Temperature (F) 98.5  Celsius 36.9  Pulse Pulse 98  Respirations Respirations 18  Systolic BP Systolic BP 841  Diastolic BP (mmHg) Diastolic BP (mmHg) 324  Mean BP 154  Pulse Ox % Pulse Ox % 98  Pulse Ox Activity Level  At rest  Oxygen Delivery Room Air/ 21 %   Hepatic:  08-Sep-15 12:12   Albumin, Serum  2.2  Bilirubin, Total 0.4  Alkaline Phosphatase 80 (46-116 NOTE: New Reference Range 09/13/13)  SGPT (ALT) 33 (14-63 NOTE: New Reference Range 09/13/13)  SGOT (AST)  42  Total Protein, Serum 7.3  Cardiology:  08-Sep-15 13:31   Ventricular Rate 92  Atrial Rate 92  P-R Interval 134  QRS Duration 86  QT 412  QTc 509  P Axis 47  R Axis 19  T Axis 42  ECG interpretation Normal  sinus rhythm Prolonged QT Abnormal ECG When compared with ECG of 07-Dec-2012 13:28, T wave inversion now evident in Anterior leads QT has lengthened ----------unconfirmed---------- Confirmed by OVERREAD, NOT ([id number]), editor PEARSON, BARBARA ([id number]) on 11/02/2013 2:03:05 PM  Routine Chem:  08-Sep-15 12:12   Glucose, Serum  177  BUN  54  Creatinine (comp)  16.27  Sodium, Serum 136  Potassium, Serum  6.5  Chloride, Serum 101  CO2, Serum 22  Calcium (Total), Serum  6.5  Anion Gap 13  Osmolality (calc) 291  eGFR (African American)  4  eGFR (Non-African American)  3 (eGFR values <60mL/min/1.73 m2 may be an indication of chronic kidney disease  (CKD). Calculated eGFR is useful in patients with stable renal function. The eGFR calculation will not be reliable in acutely ill patients when serum creatinine is changing rapidly. It is not useful in  patients on dialysis. The eGFR calculation may not be applicable to patients at the low and high extremes of body sizes, pregnant women, and vegetarians.)  Result Comment CALCIUM / POTASSIUM - RESULTS VERIFIED BY REPEAT TESTING.  - NOTIFIED OF CRITICAL VALUE  - NOTIFIED CASEY PRICE AT 1242 ON  - 11/01/13.Marland KitchenMarland KitchenEl Rito  - READ-BACK PROCESS PERFORMED.  Result(s) reported on 01 Nov 2013 at 12:28PM.  Routine Hem:  08-Sep-15 12:12   WBC (CBC)  14.7  RBC (CBC)  3.23  Hemoglobin (CBC)  9.0  Hematocrit (CBC)  28.6  Platelet Count (CBC)  455  MCV 89  MCH 28.0  MCHC  31.6  RDW  16.0  Neutrophil % 90.4  Lymphocyte % 5.5  Monocyte % 2.7  Eosinophil % 0.6  Basophil % 0.8  Neutrophil #  13.3  Lymphocyte #  0.8  Monocyte # 0.4  Eosinophil # 0.1  Basophil # 0.1 (Result(s) reported on 01 Nov 2013 at 12:28PM.)    Impression 1.  Complication of dialysis with nonfunioning of the catheter.  He will need surgical revision but his hyperkalemia must be treated first.  I have recommended that he have a tunneled catheter placed .  He does not wish to do this today but will consider it.  He is to have a CT scan of the abdomen. 2.  End-stage renal disease, on peritoneal dialysis as an out patient. nephrology consult and continue dialysis   3.  Acute hyperkalemia. This is likely due to ineffective exchanges through his peritoneal dialysis. The patient is urgently to get insulin D50 and Kayexalate and will follow his serum potassium. There are no acute EKG changes related to his hyperkalemia. There is no arrhythmia on telemetry.  4.  Diabetes. Place him on sliding scale insulin for now.  5.  Hypertension. Blood pressures are slightly uncontrolled. I will resume his clonidine. We will use p.r.n. hydralazine if needed.    Plan level 3 consult   Electronic Signatures: Hortencia Pilar (MD)  (Signed 12-Sep-15 15:26)  Authored: General Aspect/Present Illness, Home Medications, Allergies, History and Physical Exam, Vital Signs, Labs, Impression/Plan   Last Updated: 12-Sep-15 15:26 by Hortencia Pilar (MD)

## 2014-06-17 NOTE — Op Note (Signed)
PATIENT NAME:  [name], [name] MR#:  [id number] DATE OF BIRTH:  1975/10/16  DATE OF PROCEDURE:  04/15/2013  PREOPERATIVE DIAGNOSIS: End-stage renal disease, requiring hemodialysis.   POSTOPERATIVE DIAGNOSIS: End-stage renal disease, requiring hemodialysis.   PROCEDURE PERFORMED: Laparoscopic assisted insertion of peritoneal dialysis catheter.   PROCEDURE PERFORMED BY: Renford DillsGregory G. Schnier, MD  FIRST ASSISTANT: Hoyle Sauerhelsea N. Hanne, PA-C  ANESTHESIA: General by endotracheal intubation.   FLUIDS: Per anesthesia record.   ESTIMATED BLOOD LOSS: Minimal.   SPECIMEN: None.   INDICATION FOR PROCEDURE: [name] is a 39 year old gentleman who has been initiated on hemodialysis via an intravenous cuffed tunneled dialysis catheter. He has elected to proceed with peritoneal dialysis and therefore requires peritoneal dialysis catheter insertion. Risks and benefits were reviewed. All questions answered. The patient has agreed to proceed.   DESCRIPTION OF PROCEDURE: The patient is taken to the operating room and placed in the supine position. After adequate general anesthesia is induced and appropriate invasive monitors are placed, he is positioned supine, and his abdomen is prepped and draped in a sterile fashion. It should be noted that the patient has extremely poor dentition, and during the intubation process, a tooth which was already fairly loose is now even more mobile and will likely need extraction, pending evaluation by a dentist. Appropriate timeout is called.   Vertical incision is made in the midline just above the umbilicus and carried down through the soft tissues to expose the fascia, and a pursestring suture of 0 Vicryl is placed. A bone hook is used to elevate the fascia, and a Veress needle is introduced without difficulty. Saline test is positive, and a 5 mm trocar is then inserted after the abdomen has been insufflated to 15 mmHg pressure with CO2. Camera is introduced through the  trocar. Evaluation of the viscera did not show any abnormalities in the visualized fields.   A site is selected in the left lower quadrant. Oblique incision is made and carried down to expose the fascia. Again, an 0 Vicryl pursestring suture is placed. A Seldinger needle is introduced, and the J-wire is advanced under laparoscopic guidance. Dilator and peel-away sheath are inserted. Peritoneal catheter is then advanced through the peel-away sheath under laparoscopic guidance and positioned with the pigtail portion in the pelvis in the midline. The cuff is then placed below the anterior sheath of the rectus, and the pursestring suture is secured.   The catheter is then tunneled superiorly and exits 2 fingerbreadths below the left costal margin just lateral to the rectus sheath. A titanium connector is placed and subsequently the cap assembly. Sterile saline 300 mL is easily infused through the peritoneal catheter, with the return of 275 mL without limitations of flow.   The pursestring suture in the midline incision is secured, and subsequently the midline incision is closed with several layers of 3-0 Vicryl, followed by 4-0 Monocryl subcuticular. The lower quadrant incision is closed with 3-0 Vicryl and then 4-0 Monocryl subcuticular. Dermabond is applied to both these incisions. Sterile dressing is applied to the exit site. The patient tolerated the procedure well, and there were no immediate complications. Sponge and needle counts were correct, and he was taken to the recovery area in stable condition.   ____________________________ Renford DillsGregory G. Schnier, MD ggs:lb D: 04/15/2013 08:46:15 ET T: 04/15/2013 09:02:56 ET JOB#: [id number]  cc: Renford DillsGregory G. Schnier, MD, <Dictator> Laverda SorensonSarath C. Kolluru, MD Renford DillsGREGORY G SCHNIER MD ELECTRONICALLY SIGNED 04/19/2013 10:19

## 2014-06-17 NOTE — Consult Note (Signed)
Brief Consult Note: Diagnosis: Urinary retention. Probable neurogenic bladder due to stroke.   Patient was seen by consultant.   Consult note dictated.   Recommend further assessment or treatment.   Orders entered.   Discussed with Attending MD.   Comments: Self-cath tid with void/cath diary.  Electronic Signatures: Orson ApeWolff, Mohab Ashby R (MD)  (Signed 17-Sep-15 17:55)  Authored: Brief Consult Note   Last Updated: 17-Sep-15 17:55 by Orson ApeWolff, Lavel Rieman R (MD)

## 2014-06-17 NOTE — Consult Note (Signed)
Brief Consult Note: Diagnosis: complication of dialysis device.   Patient was seen by consultant.   Recommend to proceed with surgery or procedure.   Discussed with Attending MD.  Electronic Signatures: Levora DredgeSchnier, Gregory (MD)  (Signed 08-Sep-15 22:17)  Authored: Brief Consult Note   Last Updated: 08-Sep-15 22:17 by Levora DredgeSchnier, Gregory (MD)

## 2014-06-17 NOTE — Discharge Summary (Signed)
PATIENT NAME:  [name], [name] MR#:  [id number] DATE OF BIRTH:  Aug 11, 1975  DATE OF ADMISSION:  11/01/2013 DATE OF DISCHARGE:  11/11/2013  ACTIVE DIAGNOSES AT THE TIME OF LEAVING THE HOSPITAL AGAINST MEDICAL ADVICE:    1. Acute peritonitis.  2. Sepsis secondary to right lower lobe pneumonia.  3. Acute bilateral cerebrovascular accident secondary to accelerated hypertension.  4. Urinary retention from neurogenic bladder from acute stroke.  5. Acute hyperkalemia.  6. End-stage renal disease on peritoneal dialysis, status post hemodialysis as well.   7. Accelerated hypertension.   8. Insulin-dependent diabetes mellitus complicated by hypoglycemia.  9. Acute encephalopathy secondary to cerebrovascular accident, resolved.  10. Depression.  11. Noncompliance.    HOSPITAL COURSE: Please review history and physical dictated by Dr. Cherlynn KaiserSainani and then please review the interim discharge summary dictated by Dr. Elpidio AnisSudini on 11/09/2013.   HOSPITAL COURSE: Acute peritonitis. Initially the patient's peritoneal dialysis catheter was not functioning technically, subsequently the technical problem was resolved and peritoneal dialysis catheter was functioning. Nephrology started doing peritoneal dialysis and confirmed that the PD catheter is working. The patient was happy and started opening the catheter and showing to other medical staff and family members that his catheter is functioning. The patient was using unhygienic techniques while opening the peritoneal dialysis catheter. Subsequently the dialysis catheter was infected and the patient was diagnosed with peritonitis and he was started on vancomycin. Infectious disease Dr. Sampson GoonFitzgerald was also consulted regarding that.   The patient was having urinary retention and he refused to place a Foley catheter. The patient was doing as needed self-catheterization. He was evaluated by urology regarding urinary retention and diagnosed with neurogenic bladder. I  recommended to continue in and out self catheterization as needed basis.   Regarding the bilateral stroke the plan is to send him to acute inpatient rehabilitation. While case management was looking for a bed at an acute rehabilitation setting the patient has decided to leave the hospital against medical advice. The patient was uncomfortable staying in the hospital until we find a bed at acute rehabilitation center. The patient was aware of the complications of leaving the hospital against medical advice and he left the hospital on 11/11/2013.    SIGNIFICANT LABORATORIES AND IMAGING STUDIES: Peritoneal dialysis fluid was showing rare Staph aureus, heavy growth of Candida species. The patient's platelet count was 512,000  on September 18.    CONDITION AT THE TIME OF DISCHARGE: Guarded.   DISPOSITION: The patient left the hospital against medical advice.   DISCHARGE MEDICATIONS: Not given as the patient left AMA.    TIME SPENT: Total time spent to coordination of care and talking to the other medical staff is 45 min     ____________________________ Mitchell LabAruna Mariachristina Holle, MD ag:bu D: 11/15/2013 13:28:00 ET T: 11/15/2013 13:50:29 ET JOB#: [id number]  cc: Mitchell LabAruna Robbie Rideaux, MD, <Dictator> Mitchell LabARUNA Kyrianna Barletta MD ELECTRONICALLY SIGNED 11/23/2013 16:20

## 2014-06-17 NOTE — Consult Note (Signed)
PATIENT NAME:  [name], [name] MR#:  [id number] DATE OF BIRTH:  [birth year]  DATE OF CONSULTATION:  11/10/2013  REFERRING PHYSICIAN:  Ramonita LabAruna Gouru, MD CONSULTING PHYSICIAN:  Suszanne ConnersMichael R. Evelene CroonWolff, MD  REASON FOR CONSULTATION: Urinary retention.   HISTORY OF PRESENT ILLNESS: [name] is a 39 year old African American male who was admitted to the hospital with peritoneal dialysis dysfunction. 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 Since he has been unable to walk, he has been unable to void. He has had a catheter in and out several times. Prior to the stroke and current admission, he had no difficulty voiding. He is on peritoneal dialysis for end-stage renal disease. He denies history of prior urological surgery.   ALLERGIES: No drug allergies.   CHRONIC HOME MEDICATIONS: Include clonidine, Lasix and Novolin insulin.   PAST SURGICAL HISTORY: Peritoneal dialysis catheter placement.   PAST AND CURRENT MEDICAL CONDITIONS:  1.  End-stage renal disease.  2.  Diabetes.  3.  Hypertension.   REVIEW OF SYSTEMS: Negative for urological disease.   PHYSICAL EXAMINATION: Deferred.   PERTINENT LABORATORY STUDIES: Include BUN of 48 and a creatinine of 10.38.   IMPRESSION:  1.  Urinary retention, probably related to recent stroke.  2.  End-stage renal disease.   SUGGESTIONS:  1.  Instruct the patient in technique of intermittent self catheter t.i.d. He is quite familiar with this as his wife has to perform that already on herself.  2.  Keep a voiding and catheterization diary, which I provided. If he is able to void spontaneously with greater than 90% of his urine output, he may discontinue self- catheterization.   ____________________________ Suszanne ConnersMichael R. Evelene CroonWolff, MD mrw:TT D: 11/10/2013 17:59:14 ET T: 11/10/2013 18:50:40 ET JOB#: [id number]  cc: Suszanne ConnersMichael R. Evelene CroonWolff, MD, <Dictator> Orson ApeMICHAEL R Harvin Konicek MD ELECTRONICALLY SIGNED 11/11/2013 8:59

## 2014-06-17 NOTE — Consult Note (Signed)
PATIENT NAME:  [name], [name] MR#:  [id number] DATE OF BIRTH:  01-31-1976  DATE OF CONSULTATION:  11/11/2013  CONSULTING PHYSICIAN:  Stann Mainlandavid P. Sampson GoonFitzgerald, MD  REASON FOR CONSULTATION: Infected peritoneal dialysis catheter.   HISTORY OF PRESENT ILLNESS: This is a 39 year old gentleman with a history of end-stage renal disease on peritoneal dialysis, admitted with a nonfunctioning peritoneal dialysis catheter. He then developed a stroke. The catheter was initially not felt to be infected, but then following opening the catheter tubing with his bare hands twice, the catheter fluid was checked, was found to be positive for Staphylococcus aureus and to have a high white count. We are consulted for further antibiotic management. He has not had any severe abdominal pain, nausea, vomiting or fevers. He is getting ready for potential transfer to rehab.   PAST MEDICAL HISTORY: 1. End-stage renal disease.  2. Diabetes.  3. Hypertension.  4. CVA.   PAST SURGICAL HISTORY: Peritoneal dialysis catheter placement and placement of a temporary hemodialysis catheter this admission.   SOCIAL HISTORY: The patient smokes about a half pack a day. No alcohol or drug use. He lives with his girlfriend.   FAMILY HISTORY: Positive for ALS in his mother and stroke and heart attack in his father.   ALLERGIES: No known drug allergies.   CURRENT ANTIBIOTICS: Include vancomycin given at dialysis. He has also received levofloxacin.   REVIEW OF SYSTEMS: An 11 systems reviewed and negative except as per HPI.   PHYSICAL EXAMINATION: VITAL SIGNS: Temperature 98.1, pulse 70, blood pressure 156/88, respirations 20, saturating 96% on room air. He has not had a fever for the last several days.  GENERAL: He is obese, lying in bed in no acute distress. He is disheveled.  HEENT: Pupils equal, round and reactive to light and accommodation. Extraocular movements are intact. Sclerae are anicteric. Oropharynx is clear. He has poor  dentition.  NECK: Supple.  HEART: Regular.  LUNGS: Clear.  ABDOMEN: Obese, distended but soft, nontender. No hepatosplenomegaly.  EXTREMITIES: 1+ edema bilaterally.  NEUROLOGIC: He is alert and oriented x 3. Grossly nonfocal neuro exam.   LABORATORY DATA: White blood count is currently 9.9, hemoglobin 8.1, platelets 509,000. Peritoneal fluid sampling on September 16 showed 13,000 white cells, 51% neutrophils. Follow-up on September 17 showed 14,000 white cells with 79% neutrophils. Culture from September 16 is growing Staphylococcus aureus. Sensitivities to follow. It is being held also for possible anaerobes. Culture from September 17 is no growth x 18 to 24 hours. Urine culture September 14 is no growth. September 11 urine culture is also negative. Blood culture September 11 negative.   IMAGING: Chest x-ray September 15 showed previous right lower lobe pneumonia with resolution. MRI of the brain September 12 showed numerous areas of restricted diffusion in the cerebral white matter, most likely consistent with acute infarct.   IMPRESSION: A 39 year old gentleman with multiple problems including diabetes and hypertension, admitted with a nonfunctioning dialysis catheter and then developed a cerebrovascular accident. He also now presumed Staphylococcus aureus peritoneal dialysis catheter infection. Clinically, he is not septic and actually has improvement with minimal abdominal symptoms. Usually with peritoneal catheter infections associated with Staphylococcus aureus or other aggressive organisms, the catheter tends to need to be removed. The patient is somewhat frail at this point given his recent cerebrovascular accident and plans for rehab. It may be worth an attempted antibiotic treatment to try to save the catheter.   RECOMMENDATIONS: 1. Vancomycin at hemodialysis for total of 3 weeks. If he worsens in  that time or develops other signs of abdominal infection, sepsis, fevers, I would suggest  removal of the catheter.  2. Following completion of antibiotics, I would sample the peritoneal fluid 4 days after antibiotics are stopped and again 10 days following cessation of antibiotics to ensure resolution of infection.  3. If infection recurs, the catheter will have to be removed.  4. I explained this to the patient.   Thank you for the consult. I will be glad to follow with you.     ____________________________ Stann Mainland. Sampson Goon, MD dpf:TT D: 11/11/2013 17:07:34 ET T: 11/11/2013 19:14:43 ET JOB#: [id number]  cc: Stann Mainland. Sampson Goon, MD, <Dictator> DAVID Sampson Goon MD ELECTRONICALLY SIGNED 11/27/2013 23:53

## 2014-06-17 NOTE — Op Note (Signed)
PATIENT NAME:  [name], [name] MR#:  [id number] DATE OF BIRTH:  19-Dec-1975  DATE OF PROCEDURE:  11/02/2013  PREOPERATIVE DIAGNOSES:  1. Complication of dialysis device with non-flow within his peritoneal catheter.  2. Endstage renal disease requiring hemodialysis.  3. Hyperkalemia and acidosis.   POSTOPERATIVE DIAGNOSES:  1. Complication of dialysis device with non-flow within his peritoneal catheter.  2. Endstage renal disease requiring hemodialysis.  3. Hyperkalemia and acidosis.   PROCEDURE PERFORMED: Insertion of cuffed tunneled right IJ catheter.   SURGEON: Renford DillsGregory G Reynaldo Rossman, MD.   SEDATION: Versed 6 mg.   FLUOROSCOPY TIME: 0.3 minutes.   CONTRAST: None.   INDICATIONS: [name] is a 39 year old gentleman who presented to the hospital because of non-function of his peritoneal catheter. Risks and benefits for insertion of a tunneled catheter reviewed. All questions answered. The patient has agreed to proceed.   DESCRIPTION OF PROCEDURE: The patient is taken to special procedures and placed in the supine position. After adequate sedation is achieved his right neck and chest wall are prepped and draped in a sterile fashion. 1% lidocaine is then infiltrated in the soft tissues surrounding the base of the neck and the chest wall. Ultrasound is used to  demonstrate the jugular vein. Jugular vein is echolucent and compressible indicating patency, images recorded for the permanent record. Real-time visualization is then used and a Seldinger needle is inserted. J-wire is attempted, but seems to buckle and a micro wire is advanced without difficulty. Micro sheath is then inserted without difficulty. Fluoroscopic guidance is used to check the micro wire's positioned and subsequently the J-wire is advanced under fluoroscopy into the inferior vena cava. Counterincision is made at the wire insertion site with an 11 blade scalpel. A small pocket created with a hemostat and subsequently the  dilators are passed over the wire without difficulty. Dilator and peel-away sheath is then inserted and the wire and dilator are removed. A 19 cm tip to cuff Cannon catheter is inserted. The peel-away sheath is removed. The catheter is then approximated to the chest wall under fluoroscopic guidance, placing the tips at the atrio-caval junction.   Exit site is then selected. A small incision is made. Tunneling device is passed and the catheter is pulled through the subcutaneous tunnel. It is transected and the hub is connected. Both lumens aspirate and flush easily and under fluoroscopy the tips are at the atrio-caval junction with a smooth contour. Both lumens are then packed with 5000 units of heparin. Neck counterincision is closed with a 4-0 Monocryl subcuticular and Dermabond. Catheter is secured to the chest wall with 0 silk and a sterile dressing is applied.   The patient tolerated the procedure well and there were no immediate complications.    ____________________________ Renford DillsGregory G. Demarques Pilz, MD ggs:bu D: 11/02/2013 19:07:00 ET T: 11/02/2013 19:41:51 ET JOB#: [id number]  cc: Renford DillsGregory G. Severina Sykora, MD, <Dictator> Renford DillsGREGORY G Coulter Oldaker MD ELECTRONICALLY SIGNED 12/12/2013 20:42

## 2014-06-17 NOTE — H&P (Signed)
PATIENT NAME:  [name], [name] DATE OF BIRTH:  [birth year]  DATE OF ADMISSION:  11/01/2013  PRIMARY CARE PHYSICIAN: [facility].   NEPHROLOGIST: Dr. Wynelle LinkKolluru   CHIEF COMPLAINT: Peritoneal dialysis catheter not functioning properly.   HISTORY OF PRESENT ILLNESS: This is a 39 year old male who presents to the hospital due to low flow with his exchanges during peritoneal dialysis. The patient says that his flows from his PD catheter have been quite on the low side. He was given a new machine. He attempted to use the new machine yesterday evening, but despite that his exchanges and his flow through his PD cath have been low. It has been taking a longer time than usual. He therefore came to the ER for further evaluation. In the Emergency Room, the patient was noted to have a potassium of 6.5. He had no EKG changes, labs were consistent with hyperkalemia, and he had no arrhythmias. Given his clotted PD cath and ineffective exchanges and hyperkalemia, hospitalist services were contacted for further treatment and evaluation.   REVIEW OF SYSTEMS: CONSTITUTIONAL: No documented fever. No weight gain. No weight loss.  EYES: No blurred or double vision.  ENT: No tinnitus. No postnasal drip. No redness of the oropharynx.  RESPIRATORY: No cough. No wheeze. No hemoptysis. No dyspnea.  CARDIOVASCULAR: No chest pain. No orthopnea. No palpitations. No syncope.  GASTROINTESTINAL: No nausea, vomiting or diarrhea. No abdominal pain. No melena or hematochezia.  GENITOURINARY: No dysuria and no hematuria.  ENDOCRINE: No polyuria or nocturia. No heat or cold intolerance.  HEMATOLOGIC: No anemia, no bruising and no bleeding.  INTEGUMENTARY: No rashes or lesions.  MUSCULOSKELETAL: No arthritis, no swelling and no gout.  NEUROLOGIC: No numbness, no tingling, no ataxia and no seizure-type activity.  PSYCHIATRIC: No anxiety, no insomnia and no ADD.   PAST MEDICAL HISTORY: Consistent with  end-stage renal disease, on peritoneal dialysis, hypertension and diabetes.   ALLERGIES: No known drug allergies.   SOCIAL HISTORY: He does smoke about a half of pack per day and has been smoking for the past 15 years. No alcohol abuse. No illicit drug abuse. Lives at home with his girlfriend.   FAMILY HISTORY: Mother and father are both deceased. Father died from complications of ALS. Mother had stroke and heart attack.   CURRENT MEDICATIONS: Clonidine 0.3 mg b.i.d., Lasix 80 mg daily, Novolin regular insulin 15 units b.i.d.   PHYSICAL EXAMINATION: Presently as follows:  VITAL SIGNS: Temperature 98.1, pulse 96, respirations 19, blood pressure 199/121, sats 97% on room air.  GENERAL: He is a pleasant-appearing male in no apparent distress.  HEAD, EYES, EARS, NOSE AND THROAT: Atraumatic, normocephalic. Extraocular muscles are intact. Pupils equal and reactive to light. Sclerae anicteric. No conjunctival injection. No pharyngeal erythema.  NECK: Supple. There is no jugular venous distention. No bruits, no lymphadenopathy and no thyromegaly.  HEART: Regular rate and rhythm. No murmurs, no rubs and no clicks.  LUNGS: Clear to auscultation bilaterally. No rales or rhonchi. No wheezes.  ABDOMEN: Soft, flat, nontender, nondistended. Has good bowel sounds. No hepatosplenomegaly appreciated. He does have a positive peritoneal dialysis catheter with no evidence of any drainage around the site.  EXTREMITIES: No evidence of any cyanosis, clubbing. Does have +1 pitting edema from the knees to the ankles bilaterally. He has +2 pedal and radial pulses bilaterally.  NEUROLOGIC: The patient is alert, awake, and oriented x3 with no focal motor or sensory deficits appreciated bilaterally.  SKIN: Moist and warm with no  rashes appreciated.  LYMPHATIC: There is no cervical or axillary lymphadenopathy.   DIAGNOSTIC DATA: Serum glucose 177, BUN 54, creatinine 16.2, sodium 136, potassium 6.5, chloride 101, bicarb 22.  LFTs are within normal limits. White cell count 14.7, hemoglobin 9, hematocrit 28.6, platelet count 455,000.   ASSESSMENT AND PLAN: This is a 39 year old male with history of end-stage renal disease, on peritoneal dialysis, hypertension and diabetes who presented to the hospital as his peritoneal dialysis catheter was not working well and he was noted to be acutely hyperkalemic.  1.  Acute hyperkalemia. This is likely due to ineffective exchanges through his peritoneal dialysis. The patient is urgently to get insulin D50 and Kayexalate and will follow his serum potassium. There are no acute EKG changes related to his hyperkalemia. There is no arrhythmia on telemetry.  2.  End-stage renal disease, on peritoneal dialysis. The patient apparently has been having low flow with his exchanges at home for the past 2 days. I will consult vascular surgery to evaluate his peritoneal dialysis catheter. We will also get a nephrology consult and continue dialysis as per them.  3.  Hypertension. Blood pressures are slightly uncontrolled. I will resume his clonidine. We will use p.r.n. hydralazine if needed. 4.  Diabetes. Place him on sliding scale insulin for now.   CODE STATUS: The patient is a FULL code.   TIME SPENT: 50 minutes.  ____________________________ Rolly Pancake. Cherlynn Kaiser, MD vjs:sb D: 11/01/2013 14:59:02 ET T: 11/01/2013 15:31:15 ET JOB#: [id number]  cc: Rolly Pancake. Cherlynn Kaiser, MD, <Dictator> Houston Siren MD ELECTRONICALLY SIGNED 11/02/2013 16:00

## 2014-06-17 NOTE — Consult Note (Signed)
Discussed case with Dr Sampson GoonFitzgerald. will place an ID  consult for peritonitis  Electronic Signatures: Mosetta PigeonSingh, Locklan Canoy (MD)  (Signed on 18-Sep-15 15:42)  Authored  Last Updated: 18-Sep-15 15:42 by Mosetta PigeonSingh, Mariaguadalupe Fialkowski (MD)

## 2014-06-17 NOTE — Op Note (Signed)
PATIENT NAME:  [name], [name] MR#:  [id number] DATE OF BIRTH:  1975/12/25  DATE OF PROCEDURE:  11/16/2013  PREOPERATIVE DIAGNOSES:  1.  Candida peritonitis.  2.  Complication of dialysis device.  3.  End-stage renal disease, previously on peritoneal dialysis.   POSTOPERATIVE DIAGNOSES: 1.  Candida peritonitis.  2.  Complication of dialysis device.  3.  End-stage renal disease, previously on peritoneal dialysis.   PROCEDURE PERFORMED: Removal of peritoneal dialysis catheter.   SURGEON: Renford DillsGregory G. Schnier, MD   ANESTHESIA: General by LMA.   FLUIDS: Per anesthesia record.   ESTIMATED BLOOD LOSS: Minimal.   SPECIMEN: Peritoneal catheter removed and photographed on the back table for the permanent record.   INDICATIONS: [name] is a 39 year old gentleman who has previously been on peritoneal dialysis catheter but is now found to have Candida peritonitis. The risks and benefits of removal of the catheter were reviewed, all questions answered, patient agrees to proceed.   DESCRIPTION OF PROCEDURE: The patient is taken to the Operating Room and placed in the supine position. After adequate general anesthesia is induced and appropriate invasive monitors are placed, he is positioned supine and the catheter and an abdominal wall are prepped and draped in a sterile fashion. Appropriate timeout is called.   Marcaine 0.25% with epinephrine is then infiltrated in the soft tissues in and around the scar in the left lower quadrant and this is re-incised with a 15 blade scalpel. Dissection is carried down to identify the catheter, which is then followed to the abdominal wall in the rectus sheath. Zero Vicryl pursestring suture is placed around the catheter, and once this is accomplished the cuff is dissected free from the subfascial plane. Once the cuff has been freed, the catheter is removed from the peritoneal cavity. The pursestring suture is then secured.   Working from this incision, the  second cuff is identified and dissected free. Once it has been freed, the catheter is transected and the 2 halves are removed from the surgical field. The left lower quadrant incision is then irrigated with sterile saline and closed in multiple layers using 3-0 Vicryl, followed by 4-0 Monocryl subcuticular and then Dermabond. The patient tolerated the procedure well and there were no immediate complications. Sponge and needle counts were correct and he was taken to the recovery room in excellent condition.   ____________________________ Renford DillsGregory G. Schnier, MD ggs:TT D: 11/16/2013 17:49:18 ET T: 11/16/2013 22:46:18 ET JOB#: [id number]  cc: Renford DillsGregory G. Schnier, MD, <Dictator> Laverda SorensonSarath C. Kolluru, MD Renford DillsGREGORY G SCHNIER MD ELECTRONICALLY SIGNED 12/12/2013 20:43

## 2014-06-19 LAB — SURGICAL PATHOLOGY

## 2014-06-25 NOTE — Consult Note (Signed)
Chief Complaint:  Subjective/Chief Complaint seen for nausea vomiting, hematemesis.  Not recurrent since admission, tolerating clears, no n or abdominal pain.  no odynophagia.   VITAL SIGNS/ANCILLARY NOTES: **Vital Signs.:   11-Feb-16 08:32  Temperature Temperature (F) 98.2  Celsius 36.7  Respirations Respirations 18  Systolic BP Systolic BP 938  Diastolic BP (mmHg) Diastolic BP (mmHg) 82  Mean BP 105  Pulse Ox % Pulse Ox % 92  Pulse Ox Activity Level  At rest  Oxygen Delivery 3L  *Intake and Output.:   11-Feb-16 06:08  Stool  per patient - small stool    09:37  Stool  per patient bowel movement   Brief Assessment:  Cardiac Regular   Respiratory clear BS   Gastrointestinal details normal Soft  Nontender  Nondistended  No masses palpable  Bowel sounds normal   Lab Results: Hepatic:  11-Feb-16 11:26   Albumin, Serum  2.4  Routine Chem:  09-Feb-16 11:19   BUN  81  Creatinine (comp)  11.35    17:17   BUN  48  Creatinine (comp)  7.53  10-Feb-16 04:34   BUN  49  Creatinine (comp)  8.54  11-Feb-16 11:26   Glucose, Serum 83  BUN  47  Creatinine (comp)  10.35  Sodium, Serum 138  Potassium, Serum 3.9  Chloride, Serum  97  CO2, Serum 31  Calcium (Total), Serum  8.0  Phosphorus, Serum  5.0  Anion Gap 10  Osmolality (calc) 287  eGFR (African American)  7  eGFR (Non-African American)  6 (eGFR values <65mL/min/1.73 m2 may be an indication of chronic kidney disease (CKD). Calculated eGFR, using the MRDR Study equation, is useful in  patients with stable renal function. The eGFR calculation will not be reliable in acutely ill patients when serum creatinine is changing rapidly. It is not useful in patients on dialysis. The eGFR calculation may not be applicable to patients at the low and high extremes of body sizes, pregnant women, and vegetarians.)  Routine UA:  11-Feb-16 05:57   Color (UA) Straw  Clarity (UA) Clear  Glucose (UA) >=500  Bilirubin (UA) Negative   Ketones (UA) Negative  Specific Gravity (UA) 1.007  Blood (UA) Negative  pH (UA) 9.0  Protein (UA) 100 mg/dL  Nitrite (UA) Negative  Leukocyte Esterase (UA) Negative (Result(s) reported on 06 Apr 2014 at 06:22AM.)  RBC (UA) NONE SEEN  WBC (UA) 1 /HPF  Bacteria (UA) NONE SEEN  Epithelial Cells (UA) NONE SEEN  Result(s) reported on 06 Apr 2014 at 06:22AM.  Routine Hem:  09-Feb-16 11:19   Hemoglobin (CBC)  8.7    17:17   Hemoglobin (CBC)  8.0 (Result(s) reported on 04 Apr 2014 at 05:43PM.)  10-Feb-16 01:13   Hemoglobin (CBC)  8.3 (Result(s) reported on 05 Apr 2014 at 01:33AM.)    04:34   Hemoglobin (CBC)  8.5   Assessment/Plan:  Assessment/Plan:  Assessment 1) n/v/hematemesis resolved.  Severe erosive esophagitis and diffuse gastritis.  No active bleeding.   2) esrd,  h/o CVA, htn, IDDM, failed peritoneal dialysis with fungal peritonitis.   Plan 1) change iv ppi drip to bid iv today, bid po tomorrow.  continue bid po ppi as o/p, will need repeat egd in about 7-8 weeks, GI o/p fu in 4 weeks with GI.  Will advance diet to full liquid, could start soft diet tomorrow.   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  Soft/edentulous diet might be optimal.   Dr  [employer], call if needed.   Electronic Signatures: Loistine Simas (MD)  (Signed 11-Feb-16 13:42)  Authored: Chief Complaint, VITAL SIGNS/ANCILLARY NOTES, Brief Assessment, Lab Results, Assessment/Plan   Last Updated: 11-Feb-16 13:42 by Loistine Simas (MD)

## 2014-06-25 NOTE — Consult Note (Signed)
Chief Complaint:  Subjective/Chief Complaint seen for hematemesis, nausea and vomiting.  no emesis or nausea since urgent dialysis.  no bm, no abdominalpain.   VITAL SIGNS/ANCILLARY NOTES: **Vital Signs.:   10-Feb-16 12:00  Temperature Temperature (F) 98.8  Celsius 37.1  Temperature Source oral  Pulse Pulse 62  Respirations Respirations 10  Systolic BP Systolic BP 326  Diastolic BP (mmHg) Diastolic BP (mmHg) 79  Mean BP 101  Pulse Ox % Pulse Ox % 98  Pulse Ox Activity Level  At rest  Oxygen Delivery 3L; Nasal Cannula   Brief Assessment:  Cardiac Regular   Respiratory clear BS   Gastrointestinal details normal Soft  Nontender  Nondistended  Bowel sounds normal   Lab Results: Hepatic:  10-Feb-16 04:34   Bilirubin, Total 0.5 (Result(s) reported on 05 Apr 2014 at [facility].)  Alkaline Phosphatase 99  SGPT (ALT) 38  SGOT (AST)  45  Total Protein, Serum 7.4  Albumin, Serum  3.0  Routine Chem:  10-Feb-16 01:13   Result Comment TROPONIN - PREVIOUSLY CALLED RESULT ON 04/04/14  - BY [facility] $Remo'@2125'zGZzm$ . TSH.  - RESULTS VERIFIED BY REPEAT TESTING.  Result(s) reported on 05 Apr 2014 at 02:30AM.    04:34   Lipase 204  Glucose, Serum 76  BUN  49  Creatinine (comp)  8.54  Sodium, Serum 140  Potassium, Serum 3.9  Chloride, Serum 98  CO2, Serum 31  Calcium (Total), Serum 8.9  Anion Gap 11  Osmolality (calc) 291  eGFR (African American)  9  eGFR (Non-African American)  7 (eGFR values <5mL/min/1.73 m2 may be an indication of chronic kidney disease (CKD). Calculated eGFR, using the MRDR Study equation, is useful in  patients with stable renal function. The eGFR calculation will not be reliable in acutely ill patients when serum creatinine is changing rapidly. It is not useful in patients on dialysis. The eGFR calculation may not be applicable to patients at the low and high extremes of body sizes, pregnant women, and vegetarians.)  Cardiac:  10-Feb-16 01:13   Troponin I  0.12  (0.00-0.05 0.05 ng/mL or less: NEGATIVE  Repeat testing in 3-6 hrs  if clinically indicated. >0.05 ng/mL: POTENTIAL  MYOCARDIAL INJURY. Repeat  testing in 3-6 hrs if  clinically indicated. NOTE: An increase or decrease  of 30% or more on serial  testing suggests a  clinically important change)  CPK-MB, Serum 2.1 (Result(s) reported on 05 Apr 2014 at 02:10AM.)  Routine Coag:  10-Feb-16 04:34   Prothrombin  15.2 (11.4-15.0 NOTE: New Reference Range  03/24/14)  INR 1.2 (INR reference interval applies to patients on anticoagulant therapy. A single INR therapeutic range for coumarins is not optimal for all indications; however, the suggested range for most indications is 2.0 - 3.0. Exceptions to the INR Reference Range may include: Prosthetic heart valves, acute myocardial infarction, prevention of myocardial infarction, and combinations of aspirin and anticoagulant. The need for a higher or lower target INR must be assessed individually. Reference: The Pharmacology and Management of the Vitamin K  antagonists: the seventh ACCP Conference on Antithrombotic and Thrombolytic Therapy. ZTIWP.8099 Sept:126 (3suppl): [id number]. A HCT value >55% may artifactually increase the PT.  In one study,  the increase was an average of 25%. Reference:  "Effect on Routine and Special Coagulation Testing Values of Citrate Anticoagulant Adjustment in Patients with High HCT Values." American Journal of Clinical Pathology 8338;250:539-767.)  Routine Hem:  10-Feb-16 01:13   Hemoglobin (CBC)  8.3 (Result(s) reported on 05 Apr 2014 at 01:33AM.)  04:34   WBC (CBC)  17.1  RBC (CBC)  3.06  Hemoglobin (CBC)  8.5  Hematocrit (CBC)  25.6  Platelet Count (CBC) 290  MCV 84  MCH 27.7  MCHC 33.0  RDW  20.0  Neutrophil % 77.3  Lymphocyte % 14.6  Monocyte % 7.3  Eosinophil % 0.3  Basophil % 0.5  Neutrophil #  13.3  Lymphocyte # 2.5  Monocyte #  1.2  Eosinophil # 0.0  Basophil # 0.1 (Result(s)  reported on 05 Apr 2014 at 06:44AM.)   Assessment/Plan:  Assessment/Plan:  Assessment 1) n/v hematemesis-resolved.  2) ESRD/HD-to repeat hd tomorrow.  Patietn states he usually feels worse over the longer 2 day interval between HD 3) h/o CVA, htn, IDDM, failed peritoneal dialysis with fungal peritonitis.   Plan 1) egd today.  I have discussed the risks benefits and complications of egd to include not limited to bleeding infection perforation and sedation and he wisnhes to proceed. further recs to follow.   Electronic Signatures: Loistine Simas (MD)  (Signed 10-Feb-16 14:45)  Authored: Chief Complaint, VITAL SIGNS/ANCILLARY NOTES, Brief Assessment, Lab Results, Assessment/Plan   Last Updated: 10-Feb-16 14:45 by Loistine Simas (MD)

## 2014-06-25 NOTE — Consult Note (Signed)
Chief Complaint:  Subjective/Chief Complaint Please see full GI consult and brief consult note.  Patient seen and examined.   Patietn admitted with multiple elevated labs, ams, n/v.  Found with elevated bun/crreatinine in excess of usual as well as hyperkalemia, necessitating urgent HD (in patient with h/o esrd/hd). Currently feeling much better after HD.   Patietn with episode of emesis of dark/black material pta, not recurrent.  No h/o pudz, however has not been taking previously prescribed PPI, no ulcer prophylaxis.  Hemodynamically stable hgb at about baseline, no melena reporte or since admission.  Agree with EGD but will need to be done on a day with no dialysis, May be of benefit to wait until after nest dialysis.  Discussed with Dr Candiss Norse.  Continue iv ppi.   Following.   VITAL SIGNS/ANCILLARY NOTES: **Vital Signs.:   09-Feb-16 19:15  Vital Signs Type Routine  Temperature Temperature (F) 98.6  Celsius 37  Pulse Pulse 69  Respirations Respirations 21  Systolic BP Systolic BP 027  Diastolic BP (mmHg) Diastolic BP (mmHg) 97  Mean BP 101  Pulse Ox % Pulse Ox % 94  Pulse Ox Activity Level  At rest  Oxygen Delivery 4L; Nasal Cannula  Nurse Fingerstick (mg/dL) FSBS (fasting range 65-99 mg/dL) 258   Brief Assessment:  Cardiac Regular   Respiratory clear BS   Gastrointestinal details normal Soft  Nontender  Nondistended  No masses palpable  Bowel sounds normal   Lab Results: Routine Chem:  09-Feb-16 17:17   Glucose, Serum  638  BUN  48  Creatinine (comp)  7.53  Sodium, Serum  133  Potassium, Serum 4.4  Chloride, Serum  93  CO2, Serum 29  Calcium (Total), Serum 9.1  Anion Gap 11  Osmolality (calc) 309  eGFR (African American)  10  eGFR (Non-African American)  9 (eGFR values <63mL/min/1.73 m2 may be an indication of chronic kidney disease (CKD). Calculated eGFR, using the MRDR Study equation, is useful in  patients with stable renal function. The eGFR calculation will not be  reliable in acutely ill patients when serum creatinine is changing rapidly. It is not useful in patients on dialysis. The eGFR calculation may not be applicable to patients at the low and high extremes of body sizes, pregnant women, and vegetarians.)  Result Comment GLUCOSE - RESULTS VERIFIED BY REPEAT TESTING.  - NOTIFIED OF CRITICAL VALUE  - SPOKE WITH KENDRA POTEAT AT 17.59  - 04/04/14.Marland KitchenMarland KitchenSDR  - READ-BACK PROCESS PERFORMED.  Result(s) reported on 04 Apr 2014 at 06:41PM.  Routine Hem:  09-Feb-16 11:19   Hemoglobin (CBC)  8.7  Platelet Count (CBC) 385    17:17   Hemoglobin (CBC)  8.0 (Result(s) reported on 04 Apr 2014 at 05:43PM.)   Assessment/Plan:  Assessment/Plan:  Assessment as above   Electronic Signatures: Loistine Simas (MD)  (Signed 09-Feb-16 21:01)  Authored: Chief Complaint, VITAL SIGNS/ANCILLARY NOTES, Brief Assessment, Lab Results, Assessment/Plan   Last Updated: 09-Feb-16 21:01 by Loistine Simas (MD)

## 2014-06-25 NOTE — H&P (Signed)
PATIENT NAME:  [name], [name] MR#:  [id number] DATE OF BIRTH:  07-Dec-1975  DATE OF ADMISSION:  04/04/2014  ADDENDUM  In the history of present illness, please specify the patient's diabetes as type 1 diabetes mellitus. Again, in the assessment and plan, please mention diabetic ketoacidosis secondary to type 1 diabetes mellitus, uncontrolled, and the patient is being admitted to ICU for insulin drip.  ADDITIONAL TIME SPENT: 10 minutes.   ____________________________ Enid Baasadhika Mieko Kneebone, MD rk:sb D: 04/14/2014 16:18:03 ET T: 04/14/2014 16:33:14 ET JOB#: [id number]  cc: Enid Baasadhika Lessa Huge, MD, <Dictator> Enid BaasADHIKA Van Ehlert MD ELECTRONICALLY SIGNED 05/11/2014 16:51

## 2014-06-25 NOTE — Consult Note (Signed)
PATIENT NAME:  Mitchell BottsBUSHNELL, Amdrew B MR#:  [id number] DATE OF BIRTH:  Jul 15, 1975  DATE OF CONSULTATION:  04/04/2014  REFERRING PHYSICIAN:  Enid Baasadhika Kalisetti, MD  CONSULTING PHYSICIAN:  Keturah Barrehristiane H. London, NP  REASON FOR CONSULTATION:  GI consult ordered by Dr. Nemiah CommanderKalisetti for evaluation of hematemesis.   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 The patient is a poor historian due to his expressive aphasia and unable to answer questions. He states over and over, "I feel fine now. I think I just needed dialysis," to whatever question was asked during the interview, so history is gathered through chart review. The patient's last outpatient dialysis was Saturday. Evidently, he has [facility] and began to have constant nausea and vomiting and emesis turned coffee-ground colored. On arrival to the ED, he was found to be bradycardic, acidotic, have hyperkalemia and a blood glucose level of over 1300, and was treated with improvement to cardiac status, nausea, vomiting, and hyperkalemia, currently receiving dialysis. Lipase is 1000. Hemoglobin is 8.7 with a normal platelet count. He has been started on azithromycin and ceftriaxone for suspected pneumonia with pulmonary edema on chest x-ray. I do not see any history of EGD or colonoscopy. He has been started on a pantoprazole drip. There are no other abdominal complaint reports.   PAST MEDICAL HISTORY:  End-stage renal disease on dialysis, hypertension, diabetes, CVA left-sided weakness, expressive aphasia in September 2015, ongoing smoking, peritoneal dialysis catheter placement and removal after  infectious process, left chest hemodialysis catheter placement.   ALLERGIES:  NKDA.   HOME MEDICATIONS:  Amlodipine 10 mg p.o. b.i.d., PhosLo 667 mg 3 capsules b.i.d., Celexa 40 mg b.i.d., clonidine 0.3 mg p.o. b.i.d., Lasix 80 mg p.o. daily, hydralazine 50 mg p.o. b.i.d., metoprolol 100 mg p.o. b.i.d., Novolin N 10 units subcutaneously b.i.d., Zofran 4 mg t.i.d. p.r.n.   SOCIAL HISTORY:  He lives at home with wife. Former smoker. No alcohol or EtOH.   FAMILY HISTORY:  Significant for ALS, stroke, cardiac disease.   REVIEW OF SYSTEMS:  Ten systems reviewed. Agree with admission H and P.   LABORATORY DATA:  Most recent labs:  Glucose 1302, BUN 81, creatinine 11.35, sodium 117, potassium 4.7, chloride 77, calcium 8.8, GFR 5, lipase 1082, total protein 8.2, albumin 3.6, total bilirubin 0.6, ALP 138, AST 57, ALT 49. WBC 15,000, hemoglobin 8.7, hematocrit 29.9, platelet count 385,000. PH was 7.3.   Chest x-ray with mild cardiomegaly and haziness suggestive of pulmonary edema versus pneumonia.   PHYSICAL EXAMINATION: VITAL SIGNS:  Most recent:  Temperature is 98.4, pulse 80, respiratory rate 21, blood pressure 189/90, and SaO2 is 94% on room air.  GENERAL:  Chronically ill man resting comfortably in bed in no acute distress.  HEENT:  Normocephalic, atraumatic. Sclerae are clear. Conjunctivae are pink.  NECK:  Supple. No thyromegaly or JVD.  CHEST:  Respirations are eupneic. Lungs are clear.  CARDIAC:  S1 and S2. RRR. No MRG. No appreciable edema.  ABDOMEN:  Obese. Bowel sounds x 4. Nontender, nondistended. No guarding, rigidity, peritoneal signs, hepatosplenomegaly or other abnormalities.  SKIN:  Warm, dry, and pink. No erythema, lesion, or rash.  NEUROLOGIC:  Alert. Difficulty answering questions. Difficult to ascertain orientation. Cranial  nerves appear to be intact. Motor strength is 5/5 on the right side and left side is 4/5. Sensation appears to be intact.  PSYCHIATRIC:  Pleasant, calm.    IMPRESSION AND PLAN:  Suspect his nausea and vomiting was related to his diabetic ketoacidosis/hyperkalemia, which has improved with treatment for this. Suspect hematemesis was resultant of repeated vomiting. Agree with pantoprazole drip, following hemoglobin, and transfusing p.r.n. I do think he would benefit from esophagogastroduodenoscopy when clinically feasible, as he is at risk for gastritis and ulcers; however, he will have to be clinically stable.   Thank you very much for this consult. These services were provided by Vevelyn Pat, MSN, [facility], in collaboration with Barnetta Chapel, MD, with whom I have discussed this patient in full.    ____________________________ Keturah Barre, NP chl:nb D: 04/04/2014 22:29:07 ET T: 04/04/2014 23:19:46 ET JOB#: [id number]  cc: Keturah Barre, NP, <Dictator> Eustaquio Maize LONDON FNP ELECTRONICALLY SIGNED 04/10/2014 15:30

## 2014-06-25 NOTE — Op Note (Signed)
PATIENT NAME:  [name], [name] MR#:  [id number] DATE OF BIRTH:  [birth year]  DATE OF PROCEDURE:  05/19/2014  PREOPERATIVE DIAGNOSES:  1.  Complication of dialysis device with nonfunction of his right internal jugular catheter.  2.  End-stage renal disease requiring hemodialysis.  3.  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   POSTOPERATIVE DIAGNOSES: 1.  Complication of dialysis device with nonfunction of his right internal jugular catheter.  2.  End-stage renal disease requiring hemodialysis.  3.  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    PROCEDURE PERFORMED: Exchange of cuffed tunneled dialysis catheter, same venous access.   SURGEON: Renford DillsGregory G. Schnier, MD   SEDATION: Versed plus fentanyl.   CONTRAST USED: None.   FLUOROSCOPY TIME: Approximately 0.5 minutes.   ACCESS: Right IJ.   INDICATIONS: [name] is a 39 year old gentleman who was sent in from the dialysis center secondary to nonfunction of his catheter. Risks and benefits for exchange over the wire was reviewed. All questions answered. The patient agrees to proceed.   DESCRIPTION OF PROCEDURE: The patient is taken to special procedures and placed in the supine position. After adequate sedation was achieved, the right neck and chest wall, as well as the existing catheter were prepped and draped in a sterile fashion. The cuff was localized by palpation and 1% lidocaine was infiltrated in the soft tissue surrounding it. Lidocaine was also  used to anesthetize the tunnel from the location of the cuff to the exit site of the catheter.   A small transverse incision was made with an 11 blade and the dissection was carried down to expose the catheter, which was grasped with a hemostat. The cuff was then teased away from the surrounding adhesions and once the catheter was freely mobile, an Amplatz Super Stiff wire was advanced through the  catheter and the catheter was removed through the existing tunnel. A new 19 cm tip to cuff Palindrome catheter was then advanced over the wire without difficulty. Fluoroscopy was used to verify tip location and the wire was removed. The catheter was then  evaluated for a smooth contour. Both lumens were aspirated and flushed easily and were packed with 5000 units of heparin per lumen. The counterincision was then closed with 4-0 Monocryl and Dermabond. Catheter was secured to the chest wall with 0 silk and a sterile dressing was applied. The patient tolerated the procedure well and there were no immediate complications.   ____________________________ Renford DillsGregory G. Schnier, MD ggs:TT D: 05/19/2014 17:00:00 ET T: 05/20/2014 02:12:51 ET JOB#: [id number]  cc: Renford DillsGregory G. Schnier, MD, <Dictator> Mosetta PigeonHarmeet Singh, MD   Renford DillsGREGORY G SCHNIER MD ELECTRONICALLY SIGNED 06/06/2014 15:10

## 2014-06-25 NOTE — Consult Note (Signed)
Brief Consult Note: Diagnosis: hyperkalemia.   Patient was seen by consultant.   Consult note dictated.   Comments: Appreciate consult for 39 yo [place] w/hx ESRD on dialysis t/th/sa, CVA  w/ exp aphasia & lt weakness 9/15, DM, hTn, admitted with hyperkalemia requiring emergent dialysis and found to have DKA, for evaluation hematemesis. Patient is poor historian due to his expressive aphasia. Unable to answer questions- states over and over "I feel fine now. I think I just needed dialysis" to whatever question was asked during interview, so hx gathered through chart review. Pt last op dialysis Saturday. Evidently had been [facility]. Began to have constant NV- emesis turned coffee ground colored. On arrival to ED was found to be bradycardic/acidotic, have hyperkalemia, and a blood glucose level of >1300.  Was treated with improvement to cardiac status, NV, and hyperkalemia. Currently receiving dialysis.  Lipase about 1000. Hgb 8.7 with normal platelet count. He has been started on Azithromycin and Ceftriaxone for suspected pneumonia v/ pulmonary edema on CXR.  I do not see any history of EGD or colonoscopy. He has been started on Pantoprazole gtt. Abdomen nondistended/nontender on exam.  Impression/Plan: Suspect his NV was related to his DKA/hyperkalemia. Improved with treatment for this. Suspect hematemesis was resultant of repeated vomiting. Agree with Pantoprazole gtt, following hgb, and transfusing prn. Do think he will benefit from EGD when clinically feasible, however will have to be clinically stable. Following with you, thanks.  Electronic Signatures: Vevelyn PatLondon, Ayasha Ellingsen H (NP)  (Signed 09-Feb-16 15:44)  Authored: Brief Consult Note   Last Updated: 09-Feb-16 15:44 by Keturah BarreLondon, Ashlay Altieri H (NP)

## 2014-06-25 NOTE — H&P (Signed)
PATIENT NAME:  [name], EVRARD MR#:  [id number] DATE OF BIRTH:  08-25-1975  DATE OF ADMISSION:  04/04/2014  ADMITTING PHYSICIAN: Enid Baas, MD   PRIMARY CARE PHYSICIAN: [facility]  PRIMARY NEPHROLOGIST: Laverda Sorenson, MD   CHIEF COMPLAINT: Nausea, vomiting and hematemesis.   HISTORY OF PRESENT ILLNESS: [name] is a 39 year old male with multiple medical problems, including end-stage renal disease on Tuesday/Thursday/Saturday hemodialysis, hypertension, insulin-dependent diabetes mellitus, history of CVA with minimal left-sided weakness and some expressive aphasia, history of fungal peritonitis resulting in removal of his peritoneal dialysis catheter about 4 months ago, who presents to the hospital from home secondary to nausea, vomiting and hematemesis that started over the last couple of days. The patient states that his last dialysis was Saturday per schedule. He did not have any issues during dialysis. The last couple of days, especially yesterday, he has been having constant nausea and intractable vomiting that resulted in coffee-ground emesis. He called EMS and en route it was noted that he was bradying down into the 30s, he had peaked T-waves as. As soon as he brought to the ER, hyperkalemia was suspected, even before the labs were back. He was treated appropriately with calcium gluconate, insulin, and dextrose.   The patient states his nausea and vomiting are better at this time. From brady, he has improved to normal sinus rhythm; heart rate in the 70s at this time. Blood pressure is stable. His labs show that he acidotic, potassium of 7.7, sodium 117, creatinine at 11.3 and sugar greater than 1300. He is being admitted to ICU for further treatment.   PAST MEDICAL HISTORY:  1.  End-stage renal disease, currently on hemodialysis.  2.  Hypertension.  3.  Insulin-dependent diabetes mellitus.  4.  History of CVA with minimal left-sided weakness.  5.  Ongoing  smoking.   PAST SURGICAL HISTORY:  1.  Peritoneal dialysis catheter placement and removal.  2.  Left chest hemodialysis catheter placement.   ALLERGIES TO MEDICATIONS: No known drug allergies.   CURRENT HOME MEDICATIONS:  1.  Amlodipine 10 mg p.o. b.i.d.  2.  PhosLo 667 mg 3 capsules p.o. b.i.d.  3.  Celexa 40 mg p.o. b.i.d.  4.  Clonidine 0.3 mg p.o. b.i.d.  5.  Lasix 80 mg p.o. daily.  6.  Hydralazine 50 mg p.o. b.i.d.  7.  Metoprolol 100 mg p.o. b.i.d.  8.  Novolin N 10 units subcutaneous b.i.d.  9.  Novolin R 10 units subcutaneously twice a day.  10.  Zofran 4 mg 3 times a day as needed for nausea and vomiting.   SOCIAL HISTORY: Lives at home with his wife. Used to smoke about 1/2 pack per day; has not smoked anything in the last 4 months according to him. No alcohol or drug abuse.   FAMILY HISTORY: Both parents are deceased; from records it seems father died from ALS complications, and mother had a stroke and heart disease.   REVIEW OF SYSTEMS:  CONSTITUTIONAL: No fever, fatigue, or weakness.  EYES: Positive for blurred vision. No inflammation or glaucoma.  ENT: No tinnitus, ear pain, hearing loss, epistaxis or discharge.  RESPIRATORY: No cough, wheeze, hemoptysis, or chronic obstructive pulmonary disease.  CARDIOVASCULAR: No chest pain, orthopnea, edema, arrhythmia, palpitations, or syncope.  GASTROINTESTINAL: Positive for nausea, vomiting and hematemesis. He denies diarrhea or abdominal pain. No jaundice or rectal bleed.  GENITOURINARY: No dysuria, hematuria, renal calculus or frequency. The patient continues to make urine in spite of being a  dialysis patient.   ENDOCRINE: No polyuria, nocturia, thyroid problems, heat or cold intolerance.  HEMATOLOGY: No anemia, easy bruising or bleeding.  SKIN: No acne, rash, or lesions.  MUSCULOSKELETAL: No neck, back, shoulder pain, arthritis or gout. Noted tremors.  NEUROLOGIC: History of CVA present. No numbness, weakness, vertigo, or  ataxia.  PSYCHOLOGIC: No anxiety, insomnia or depression.   PHYSICAL EXAMINATION:  VITAL SIGNS: Temperature 98.4 degrees Fahrenheit, pulse now is 80, respirations 21, blood pressure 189/90, pulse oximetry 94% on 4 L oxygen.  GENERAL: Heavily built, well-nourished male, lying in bed in mild distress with tremors.  HEENT: Normocephalic, atraumatic. Pupils are equal, round, reacting to light. Anicteric sclerae. Extraocular movements intact. Oropharynx is clear, without erythema, mass or exudates. Poor dentition noted.  NECK: Supple. No thyromegaly, JVD or carotid bruits. No lymphadenopathy.  LUNGS: Moving air bilaterally. No wheeze or crackles. Fine bibasilar crackles heard. No use of accessory muscles for breathing.  CARDIOVASCULAR: S1, S2. Regular rate and rhythm. A 3/6 systolic murmur heard. No rubs or gallops.  ABDOMEN: Obese, soft, nontender, nondistended. No hepatosplenomegaly. Normal bowel sounds.  EXTREMITIES: 3+ pedal edema present bilateral lower extremities; feeble dorsalis pedis pulses. No clubbing or cyanosis.  SKIN: No acne, rash, or lesions.  LYMPHATICS: No cervical lymphadenopathy.  NEUROLOGIC: Cranial nerves intact. Motor strength is 5/5 in both the right upper and lower extremities, and on the left side it is 4+/5 left upper and lower extremities. Sensation is intact. No new focal neurological deficits. The patient seems like he has some expressive aphasia; wants to answer the question, but keeps on repeating certain words ; not sure if this is acute or chronic at this point.  PSYCHOLOGICAL: The patient is awake, alert, oriented x 3.   LABORATORY DATA: WBC 15.1, hemoglobin 8.7, hematocrit 29.9, platelet count 385,000.  Sodium 117, potassium 7.7, chloride 77, bicarbonate 15, BUN 81, creatinine 11.35, glucose 1302 and calcium of 8.8. ALT is 49, AST 57, alkaline phosphatase 138. Total bilirubin 0.6, albumin of 3.6, anion gap is elevated at 25, and lipase is elevated at 1082. The pH is  7.30, pCO2 of 34. Troponin is 0.002. Chest x-ray is pending at this time. EKG showing sinus arrhythmia with peaked T-waves.   ASSESSMENT AND PLAN: This is 39 year old male with multiple medical problems including hypertension, insulin-dependent diabetes mellitus, end-stage renal disease on Tuesday/Thursday/Saturday hemodialysis, history of cerebrovascular accident with minimal left-sided weakness, comes with nausea, vomiting and hematemesis. Noted to have peaked T-waves with elevated creatinine and lipase.  1.  Hyperkalemia. Had dialysis per schedule on Saturday. Denies dietary noncompliance. Could be GI bleed causing the hyperkalemia, given the emergency medications in the ER with calcium chloride, insulin, and dextrose, because of peaked T-waves and heart rate changes. Seems to be more stable now; going for urgent dialysis. Repeat laboratories again. Will be admitted to the CCU.  2.  Diabetic ketoacidosis with sugars greater than 1300. Elevated anion gap. Started on insulin drip.  3.  Hematemesis. Repeat hemoglobin q. 6 hours. Now hemoglobin is at 8, which is stable and chronic, but it could be from hemoconcentration as well. So, keep repeating. Started on Protonix drip and gastroenterology consult.  4.  Acute pancreatitis with no nausea and vomiting, and elevated lipase. Keep him n.p.o. Monitored lipase. He had fungal peritonitis about 3-4 months ago; PD catheter has been taken out. Advance diet if clinically improving.  5.  Hyponatremia, hypochloremia, metabolic alkalosis. Likely will be corrected with the dialysis. Repeat BMP in 4 hours.  6.  End-stage renal disease on hemodialysis on Tuesday/Thursday/Saturday schedule. Last dialysis was on Saturday and dialysis again today per schedule. Further monitoring per nephrology.  7.  Hypertension. Continue home medications.  8.  History of CVA with minimal left-sided weakness. Seems stable at baseline. Continue medications.  9.  Deep vein thrombosis  prophylaxis with TEDs and SCDs at this time.   CODE STATUS: The patient is Full Code.   TOTAL CRITICAL CARE TIME SPENT ON ADMISSION: 60 minutes.   The patient is critically ill at this time, and high risk for cardiorespiratory arrest.    ____________________________ Enid Baas, MD rk:MT D: 04/04/2014 12:54:06 ET T: 04/04/2014 13:25:11 ET JOB#: [id number]  cc: Enid Baas, MD, <Dictator> [facility] Laverda Sorenson, MD Enid Baas MD ELECTRONICALLY SIGNED 05/11/2014 16:49

## 2014-06-25 NOTE — Discharge Summary (Signed)
PATIENT NAME:  Mitchell BottsBUSHNELL, [name] MR#:  [id number] DATE OF BIRTH:  Dec 09, 1975  DATE OF ADMISSION:  04/04/2014 DATE OF DISCHARGE:  04/07/2014  DISCHARGE DIAGNOSES: 1.  Diabetic ketoacidosis.  2.  End-stage renal disease, on hemodialysis.  3.  Pneumonia.  4.  Hyperkalemia.  5.  Gastritis and duodenitis and erosive esophagitis.   CODE STATUS: FULL code.   DISCHARGE MEDICATIONS: 1.  Clonidine 0.3 mg oral tablet 2 times a day.  2.  Calcium acetate 667 mg oral 2 times a day.  3.  Hydralazine 50 mg oral tablet 2 times a day.  4.  Metoprolol 100 mg 2 times a day.  5.  Citalopram 40 mg oral tablet 2 times a day.  6.  Furosemide 80 mg once a day.  7.  Amlodipine 10 mg 2 times a day. 8.  Ondansetron 4 mg oral tablet 3 times a day as needed for nausea and vomiting. 9.  Novolin subcutaneous 5 units 2 times a day.  10.  Ceftin 500 mg oral tablet 2 times a day for 2 days.  11.  Pantoprazole 40 mg delayed-release tablet 2 times a day.  DIET ON DISCHARGE: Renal diet. Diet consistency: Mechanical, soft.  TIMEFRAME TO FOLLOWUP: In 1 to 2 weeks. [facility] and [facility]  HISTORY: A 39 year old male with multiple medical problems including end-stage renal disease on Tuesday, Thursday, and Saturday hemodialysis, hypertension, insulin-dependent diabetes and history of cerebrovascular accident with minimal left-sided weakness and some expressive aphasia, history of fungal peritonitis resulting in removal of his peritoneal dialysis catheter about 4 months ago who presents to the hospital from home secondary to nausea, vomiting and hematemesis that started over the last couple of days. The patient states that his last dialysis was Saturday, per schedule. He did not have any issues during dialysis. For the last couple of days, especially yesterday, he has been having constant nausea and intractable vomiting that resulted in  coffee ground emesis. He called EMS and in route it was noted that he was having heart rate of up to 30s and had tall P waves. He was brought to the ER for suspected hyperkalemia and treated appropriately with calcium gluconate, insulin, and dextrose, and EKG confirmed the above findings. His heart rate improved to normal sinus rhythm, up to 70s. Blood pressure was stable. He had acidosis and potassium of 7.7, creatine of 11.3, and blood sugar was greater than 1300, so he was admitted to ICU with diagnosis of diabetic ketoacidosis and hyperkalemia.   HOSPITAL COURSE AND STAY:  1.  Hyperkalemia. He had DKA on presentation and had bradycardia with P wave changes, so treated for hyperkalemia immediately and then started on DKA treatment, which helped to bring down his potassium level. He was also taken for dialysis urgently and so his hyperkalemia resolved.  2.  Diabetic ketoacidosis. Started on insulin drip in ICU and monitored. Advised to keep checking his blood sugar. Sugar level was running slightly on the lower side, on his high dose of Levemir, so we decreased the dose and advised to take that lower dose at home also while keep checking his blood sugar.  3.  Hematemesis. He had an episode of hematemesis at home before presentation and so we called GI. They did upper endoscopy and found severe esophagitis, duodenitis and gastritis. Advised to do EGD in 2 months and give pump inhibitors Tyler.i.d.  4.  Pneumonia. Maybe that was  due to aspiration. We continued on Rocephin and Zithromax and discharged on Cefuroxime.  5.  Acute pancreatitis. It was suspected on admission, but ruled out.  6.  Hyponatremia and hypochloremia. Mostly it was due to dehydration and hypoglycemia and it corrected with fluids and correction of DKA.  7.  Endstage renal disease. He was maintained on hemodialysis in hospital.  8.  History of stroke. Had left-sided weakness. Appeared to be stable and baseline while in the hospital.    CONSULTANTS IN HOSPITAL: Dr. Thedore Mins for nephrology, Dr. Marva Panda for GI.  DIAGNOSTIC DATA: Important lab results: On presentation, WBC count 15.1, hemoglobin 8.7, platelet count 385,000. Glucose was 1302, BUN 81, creatinine 11.35, sodium 117, potassium 7.7, chloride 77, CO2 15.   PH on admission was 7.3 and pCO2 of 34. Troponin 0.02.   Chest x-ray, portable, was done on admission, which showed vascular congestion with mild hazy bilateral lower lobe opacity.   Blood culture was negative on admission. The next day potassium was corrected. Hemoglobin was 8.5 and WBC 17.1. Urinalysis was negative.   TOTAL TIME SPENT ON THIS DISCHARGE: 40 minutes.  ____________________________ [name] Mitchell Pigeon, MD vgv:sb D: 04/10/2014 08:34:31 ET T: 04/10/2014 10:11:20 ET JOB#: [id number]  cc: [name]. Mitchell Pigeon, MD, <Dictator> Mitchell Deem, MD Mitchell Sorenson, MD Mitchell Pigeon, MD  Mitchell Dilling MD ELECTRONICALLY SIGNED 04/12/2014 16:11

## 2014-07-26 ENCOUNTER — Ambulatory Visit
Admission: RE | Admit: 2014-07-26 | Discharge: 2014-07-26 | Disposition: A | Discharge status: Home / Self Care | Payer: Medicaid Other | Source: Ambulatory Visit | Attending: Vascular Surgery | Admitting: Vascular Surgery

## 2014-07-26 ENCOUNTER — Encounter
Admission: RE | Admit: 2014-07-26 | Discharge: 2014-07-26 | Disposition: A | Discharge status: Home / Self Care | Payer: Medicaid Other | Source: Ambulatory Visit | Attending: Vascular Surgery | Admitting: Vascular Surgery

## 2014-07-26 DIAGNOSIS — K219 Gastro-esophageal reflux disease without esophagitis: Secondary | ICD-10-CM | POA: Insufficient documentation

## 2014-07-26 DIAGNOSIS — E119 Type 2 diabetes mellitus without complications: Secondary | ICD-10-CM | POA: Insufficient documentation

## 2014-07-26 DIAGNOSIS — N189 Chronic kidney disease, unspecified: Secondary | ICD-10-CM | POA: Diagnosis not present

## 2014-07-26 DIAGNOSIS — Z01812 Encounter for preprocedural laboratory examination: Secondary | ICD-10-CM | POA: Insufficient documentation

## 2014-07-26 DIAGNOSIS — Z01811 Encounter for preprocedural respiratory examination: Secondary | ICD-10-CM | POA: Insufficient documentation

## 2014-07-26 DIAGNOSIS — I10 Essential (primary) hypertension: Secondary | ICD-10-CM | POA: Insufficient documentation

## 2014-07-26 DIAGNOSIS — F17200 Nicotine dependence, unspecified, uncomplicated: Secondary | ICD-10-CM | POA: Insufficient documentation

## 2014-07-26 LAB — CBC
HCT: 34.4 % — ABNORMAL LOW (ref 40.0–52.0)
Hemoglobin: 11.2 g/dL — ABNORMAL LOW (ref 13.0–18.0)
MCH: 27.6 pg (ref 26.0–34.0)
MCHC: 32.5 g/dL (ref 32.0–36.0)
MCV: 84.9 fL (ref 80.0–100.0)
Platelets: 277 10*3/uL (ref 150–440)
RBC: 4.05 MIL/uL — AB (ref 4.40–5.90)
RDW: 17.4 % — ABNORMAL HIGH (ref 11.5–14.5)
WBC: 5.2 10*3/uL (ref 3.8–10.6)

## 2014-07-26 LAB — TYPE AND SCREEN
ABO/RH(D): O POS
Antibody Screen: NEGATIVE

## 2014-07-26 LAB — BASIC METABOLIC PANEL
Anion gap: 13 (ref 5–15)
BUN: 62 mg/dL — AB (ref 6–20)
CALCIUM: 8.1 mg/dL — AB (ref 8.9–10.3)
CO2: 26 mmol/L (ref 22–32)
CREATININE: 9.49 mg/dL — AB (ref 0.61–1.24)
Chloride: 96 mmol/L — ABNORMAL LOW (ref 101–111)
GFR, EST AFRICAN AMERICAN: 7 mL/min — AB (ref >60)
GFR, EST NON AFRICAN AMERICAN: 6 mL/min — AB (ref >60)
Glucose, Bld: 181 mg/dL — ABNORMAL HIGH (ref 65–99)
Potassium: 5 mmol/L (ref 3.5–5.1)
Sodium: 135 mmol/L (ref 135–145)

## 2014-07-26 LAB — ABO/RH: ABO/RH(D): O POS

## 2014-07-26 NOTE — Patient Instructions (Addendum)
  Your procedure is scheduled on: Friday 07/28/14 Report to Day Surgery. Medical mall Entrance To find out your arrival time please call [phone] between 1PM - 3PM on Thursday 07/27/14.  Remember: Instructions that are not followed completely may result in serious medical risk, up to and including death, or upon the discretion of your surgeon and anesthesiologist your surgery may need to be rescheduled.    __x__ 1. Do not eat food or drink liquids after midnight. No gum chewing or hard candies.     __x__ 2. No Alcohol for 24 hours before or after surgery.   ____ 3. Bring all medications with you on the day of surgery if instructed.    __x__ 4. Notify your doctor if there is any change in your medical condition     (cold, fever, infections).     Do not wear jewelry, make-up, hairpins, clips or nail polish.  Do not wear lotions, powders, or perfumes. You may wear deodorant.  Do not shave 48 hours prior to surgery. Men may shave face and neck.  Do not bring valuables to the hospital.    [facility] is not responsible for any belongings or valuables.               Contacts, dentures or bridgework may not be worn into surgery.  Leave your suitcase in the car. After surgery it may be brought to your room.  For patients admitted to the hospital, discharge time is determined by your                treatment team.   Patients discharged the day of surgery will not be allowed to drive home.   Please read over the following fact sheets that you were given:   Surgical Site Infection Prevention   __x__ Take these medicines the morning of surgery with A SIP OF WATER:    1. omeprazole  2. clonidine  3. metoprolol  4. amlodipine  5. enalapril  6.  ____ Fleet Enema (as directed)   __x__ Use CHG Soap as directed  ____ Use inhalers on the day of surgery  ____ Stop metformin 2 days prior to surgery    __x__ Take 1/2 of usual insulin dose the night before surgery and none on the morning of  surgery.   ____ Stop Coumadin/Plavix/aspirin on   ____ Stop Anti-inflammatories on    ____ Stop supplements until after surgery.    ____ Bring C-Pap to the hospital.

## 2014-07-27 DIAGNOSIS — Z9889 Other specified postprocedural states: Secondary | ICD-10-CM | POA: Diagnosis not present

## 2014-07-27 DIAGNOSIS — I120 Hypertensive chronic kidney disease with stage 5 chronic kidney disease or end stage renal disease: Secondary | ICD-10-CM | POA: Diagnosis not present

## 2014-07-27 DIAGNOSIS — Z794 Long term (current) use of insulin: Secondary | ICD-10-CM | POA: Diagnosis not present

## 2014-07-27 DIAGNOSIS — Z79899 Other long term (current) drug therapy: Secondary | ICD-10-CM | POA: Diagnosis not present

## 2014-07-27 DIAGNOSIS — Z992 Dependence on renal dialysis: Secondary | ICD-10-CM | POA: Diagnosis not present

## 2014-07-27 DIAGNOSIS — E119 Type 2 diabetes mellitus without complications: Secondary | ICD-10-CM | POA: Diagnosis not present

## 2014-07-27 DIAGNOSIS — Z8249 Family history of ischemic heart disease and other diseases of the circulatory system: Secondary | ICD-10-CM | POA: Diagnosis not present

## 2014-07-27 DIAGNOSIS — N186 End stage renal disease: Secondary | ICD-10-CM | POA: Diagnosis not present

## 2014-07-28 ENCOUNTER — Encounter
Admission: RE | Disposition: A | Discharge status: Home / Self Care | Payer: Self-pay | Source: Ambulatory Visit | Attending: Vascular Surgery

## 2014-07-28 ENCOUNTER — Ambulatory Visit
Admission: RE | Admit: 2014-07-28 | Discharge: 2014-07-28 | Disposition: A | Discharge status: Home / Self Care | Payer: Medicaid Other | Source: Ambulatory Visit | Attending: Vascular Surgery | Admitting: Vascular Surgery

## 2014-07-28 ENCOUNTER — Ambulatory Visit: Payer: Medicaid Other | Admitting: Anesthesiology

## 2014-07-28 DIAGNOSIS — Z9889 Other specified postprocedural states: Secondary | ICD-10-CM | POA: Insufficient documentation

## 2014-07-28 DIAGNOSIS — Z794 Long term (current) use of insulin: Secondary | ICD-10-CM | POA: Insufficient documentation

## 2014-07-28 DIAGNOSIS — Z8249 Family history of ischemic heart disease and other diseases of the circulatory system: Secondary | ICD-10-CM | POA: Insufficient documentation

## 2014-07-28 DIAGNOSIS — Z79899 Other long term (current) drug therapy: Secondary | ICD-10-CM | POA: Insufficient documentation

## 2014-07-28 DIAGNOSIS — Z992 Dependence on renal dialysis: Secondary | ICD-10-CM | POA: Insufficient documentation

## 2014-07-28 DIAGNOSIS — I120 Hypertensive chronic kidney disease with stage 5 chronic kidney disease or end stage renal disease: Secondary | ICD-10-CM | POA: Diagnosis not present

## 2014-07-28 DIAGNOSIS — N186 End stage renal disease: Secondary | ICD-10-CM | POA: Insufficient documentation

## 2014-07-28 DIAGNOSIS — E119 Type 2 diabetes mellitus without complications: Secondary | ICD-10-CM | POA: Insufficient documentation

## 2014-07-28 HISTORY — PX: AV FISTULA PLACEMENT: SHX1204

## 2014-07-28 LAB — GLUCOSE, CAPILLARY
GLUCOSE-CAPILLARY: 72 mg/dL (ref 65–99)
Glucose-Capillary: 135 mg/dL — ABNORMAL HIGH (ref 65–99)

## 2014-07-28 SURGERY — ARTERIOVENOUS (AV) FISTULA CREATION
Anesthesia: General | Laterality: Left

## 2014-07-28 MED ORDER — SODIUM CHLORIDE 0.9 % IV SOLN
INTRAVENOUS | Status: DC
  Administered 2014-07-28: 07:00:00 via INTRAVENOUS

## 2014-07-28 MED ORDER — ONDANSETRON HCL 4 MG/2ML IJ SOLN: 4.0000 mg | Freq: Once | INTRAMUSCULAR | Status: DC | PRN

## 2014-07-28 MED ORDER — FENTANYL CITRATE (PF) 100 MCG/2ML IJ SOLN
INTRAMUSCULAR | Status: DC | PRN
  Administered 2014-07-28 (×4): 50 ug via INTRAVENOUS

## 2014-07-28 MED ORDER — PROPOFOL 10 MG/ML IV BOLUS
INTRAVENOUS | Status: DC | PRN
  Administered 2014-07-28: 20 mg via INTRAVENOUS
  Administered 2014-07-28: 40 mg via INTRAVENOUS
  Administered 2014-07-28: 20 mg via INTRAVENOUS
  Administered 2014-07-28: 140 mg via INTRAVENOUS

## 2014-07-28 MED ORDER — PHENOL 1.4 % MT LIQD
1.0000 | OROMUCOSAL | Status: DC | PRN
  Filled 2014-07-28: qty 177

## 2014-07-28 MED ORDER — ONDANSETRON HCL 4 MG/2ML IJ SOLN
INTRAMUSCULAR | Status: DC | PRN
  Administered 2014-07-28: 4 mg via INTRAVENOUS

## 2014-07-28 MED ORDER — MORPHINE SULFATE 2 MG/ML IJ SOLN: 2.0000 mg | INTRAMUSCULAR | Status: DC | PRN

## 2014-07-28 MED ORDER — GLYCOPYRROLATE 0.2 MG/ML IJ SOLN
INTRAMUSCULAR | Status: DC | PRN
  Administered 2014-07-28: 0.2 mg via INTRAVENOUS

## 2014-07-28 MED ORDER — METOPROLOL TARTRATE 1 MG/ML IV SOLN
2.0000 mg | INTRAVENOUS | Status: DC | PRN
Start: 2014-07-28 — End: 2014-07-28

## 2014-07-28 MED ORDER — HYDROCODONE-ACETAMINOPHEN 5-325 MG PO TABS
ORAL_TABLET | ORAL | Status: AC
  Filled 2014-07-28: qty 1

## 2014-07-28 MED ORDER — HEPARIN SODIUM (PORCINE) 5000 UNIT/ML IJ SOLN
INTRAMUSCULAR | Status: AC
  Filled 2014-07-28: qty 1

## 2014-07-28 MED ORDER — HYDROCODONE-ACETAMINOPHEN 5-325 MG PO TABS
1.0000 | ORAL_TABLET | ORAL | Status: DC | PRN
Start: 2014-07-28 — End: 2014-07-28

## 2014-07-28 MED ORDER — PAPAVERINE HCL 30 MG/ML IJ SOLN
INTRAMUSCULAR | Status: AC
  Filled 2014-07-28: qty 2

## 2014-07-28 MED ORDER — GUAIFENESIN-DM 100-10 MG/5ML PO SYRP: 15.0000 mL | ORAL_SOLUTION | ORAL | Status: DC | PRN

## 2014-07-28 MED ORDER — ACETAMINOPHEN 325 MG RE SUPP: 325.0000 mg | RECTAL | Status: DC | PRN

## 2014-07-28 MED ORDER — LABETALOL HCL 5 MG/ML IV SOLN: 10.0000 mg | INTRAVENOUS | Status: DC | PRN

## 2014-07-28 MED ORDER — FENTANYL CITRATE (PF) 100 MCG/2ML IJ SOLN
25.0000 ug | INTRAMUSCULAR | Status: DC | PRN
Start: 2014-07-28 — End: 2014-07-28

## 2014-07-28 MED ORDER — CEFAZOLIN SODIUM 1-5 GM-% IV SOLN
1.0000 g | Freq: Once | INTRAVENOUS | Status: AC
  Administered 2014-07-28: 1 g via INTRAVENOUS

## 2014-07-28 MED ORDER — MIDAZOLAM HCL 2 MG/2ML IJ SOLN
INTRAMUSCULAR | Status: DC | PRN
  Administered 2014-07-28: 2 mg via INTRAVENOUS

## 2014-07-28 MED ORDER — ONDANSETRON HCL 4 MG/2ML IJ SOLN: 4.0000 mg | Freq: Four times a day (QID) | INTRAMUSCULAR | Status: DC | PRN

## 2014-07-28 MED ORDER — ACETAMINOPHEN 325 MG PO TABS
325.0000 mg | ORAL_TABLET | ORAL | Status: DC | PRN
Start: 2014-07-28 — End: 2014-07-28

## 2014-07-28 MED ORDER — OXYCODONE HCL 5 MG PO TABS: 5.0000 mg | ORAL_TABLET | ORAL | Status: DC | PRN

## 2014-07-28 MED ORDER — DOCUSATE SODIUM 100 MG PO CAPS: 100.0000 mg | ORAL_CAPSULE | Freq: Every day | ORAL | Status: DC

## 2014-07-28 MED ORDER — HYDRALAZINE HCL 20 MG/ML IJ SOLN: 5.0000 mg | INTRAMUSCULAR | Status: DC | PRN

## 2014-07-28 MED ORDER — CEFAZOLIN SODIUM 1-5 GM-% IV SOLN
INTRAVENOUS | Status: AC
  Filled 2014-07-28: qty 50

## 2014-07-28 MED ORDER — BUPIVACAINE HCL (PF) 0.5 % IJ SOLN
INTRAMUSCULAR | Status: DC | PRN
  Administered 2014-07-28: 7 mL

## 2014-07-28 MED ORDER — LIDOCAINE HCL (CARDIAC) 20 MG/ML IV SOLN
INTRAVENOUS | Status: DC | PRN
  Administered 2014-07-28: 100 mg via INTRAVENOUS

## 2014-07-28 MED ORDER — PANTOPRAZOLE SODIUM 40 MG PO TBEC: 40.0000 mg | DELAYED_RELEASE_TABLET | Freq: Every day | ORAL | Status: DC

## 2014-07-28 MED ORDER — BUPIVACAINE HCL (PF) 0.5 % IJ SOLN
INTRAMUSCULAR | Status: AC
  Filled 2014-07-28: qty 30

## 2014-07-28 SURGICAL SUPPLY — 52 items
APPLIER CLIP 11 MED OPEN (CLIP)
APPLIER CLIP 9.375 SM OPEN (CLIP)
BAG COUNTER SPONGE EZ (MISCELLANEOUS) IMPLANT
BAG DECANTER STRL (MISCELLANEOUS) ×3 IMPLANT
BLADE SURG SZ11 CARB STEEL (BLADE) ×3 IMPLANT
BOOT SUTURE AID YELLOW STND (SUTURE) ×3 IMPLANT
BRUSH SCRUB 4% CHG (MISCELLANEOUS) ×3 IMPLANT
CANISTER SUCT 1200ML W/VALVE (MISCELLANEOUS) ×3 IMPLANT
CHLORAPREP W/TINT 26ML (MISCELLANEOUS) ×3 IMPLANT
CLIP APPLIE 11 MED OPEN (CLIP) IMPLANT
CLIP APPLIE 9.375 SM OPEN (CLIP) IMPLANT
COUNTER SPONGE BAG EZ (MISCELLANEOUS)
ELECT CAUTERY BLADE 6.4 (BLADE) ×3 IMPLANT
GLOVE SURG SYN 8.0 (GLOVE) ×3 IMPLANT
GOWN STRL REUS W/ TWL LRG LVL3 (GOWN DISPOSABLE) ×1 IMPLANT
GOWN STRL REUS W/ TWL XL LVL3 (GOWN DISPOSABLE) ×1 IMPLANT
GOWN STRL REUS W/TWL LRG LVL3 (GOWN DISPOSABLE) ×2
GOWN STRL REUS W/TWL XL LVL3 (GOWN DISPOSABLE) ×2
HEMOSTAT SURGICEL 2X3 (HEMOSTASIS) ×3 IMPLANT
IV NS 500ML (IV SOLUTION) ×2
IV NS 500ML BAXH (IV SOLUTION) ×1 IMPLANT
KIT RM TURNOVER STRD PROC AR (KITS) ×3 IMPLANT
LABEL OR SOLS (LABEL) IMPLANT
LIQUID BAND (GAUZE/BANDAGES/DRESSINGS) ×3 IMPLANT
LOOP RED MAXI  1X406MM (MISCELLANEOUS) ×2
LOOP VESSEL MAXI 1X406 RED (MISCELLANEOUS) ×1 IMPLANT
LOOP VESSEL MINI 0.8X406 BLUE (MISCELLANEOUS) ×2 IMPLANT
LOOPS BLUE MINI 0.8X406MM (MISCELLANEOUS) ×4
NEEDLE FILTER BLUNT 18X 1/2SAF (NEEDLE) ×2
NEEDLE FILTER BLUNT 18X1 1/2 (NEEDLE) ×1 IMPLANT
NS IRRIG 500ML POUR BTL (IV SOLUTION) ×3 IMPLANT
PACK EXTREMITY ARMC (MISCELLANEOUS) ×3 IMPLANT
PAD GROUND ADULT SPLIT (MISCELLANEOUS) ×3 IMPLANT
PAD PREP 24X41 OB/GYN DISP (PERSONAL CARE ITEMS) ×3 IMPLANT
PUNCH SURGICAL ROTATE 2.7MM (MISCELLANEOUS) IMPLANT
STOCKINETTE STRL 4IN 9604848 (GAUZE/BANDAGES/DRESSINGS) ×3 IMPLANT
SUT GTX CV-6 30 (SUTURE) ×6 IMPLANT
SUT MNCRL+ 5-0 UNDYED PC-3 (SUTURE) ×1 IMPLANT
SUT MONOCRYL 5-0 (SUTURE) ×2
SUT PROLENE 6 0 BV (SUTURE) ×9 IMPLANT
SUT PROLENE 7 0 BV 1 (SUTURE) ×6 IMPLANT
SUT SILK 2 0 (SUTURE) ×2
SUT SILK 2 0 SH (SUTURE) ×3 IMPLANT
SUT SILK 2-0 18XBRD TIE 12 (SUTURE) ×1 IMPLANT
SUT SILK 3 0 (SUTURE) ×2
SUT SILK 3-0 18XBRD TIE 12 (SUTURE) ×1 IMPLANT
SUT SILK 4 0 (SUTURE) ×2
SUT SILK 4-0 18XBRD TIE 12 (SUTURE) ×1 IMPLANT
SUT VIC AB 3-0 SH 27 (SUTURE) ×4
SUT VIC AB 3-0 SH 27X BRD (SUTURE) ×2 IMPLANT
SYR 20CC LL (SYRINGE) ×3 IMPLANT
SYR 3ML LL SCALE MARK (SYRINGE) ×3 IMPLANT

## 2014-07-28 NOTE — Transfer of Care (Signed)
Immediate Anesthesia Transfer of Care Note  Patient: [name]  Procedure(s) Performed: Procedure(s): ARTERIOVENOUS (AV) FISTULA CREATION (Left)  Patient Location: PACU  Anesthesia Type:General  Level of Consciousness: sedated  Airway & Oxygen Therapy: Patient Spontanous Breathing and Patient connected to face mask oxygen  Post-op Assessment: Report given to RN and Post -op Vital signs reviewed and stable  Post vital signs: Reviewed and stable  Last Vitals:  Filed Vitals:   07/28/14 0930  BP: 103/59  Pulse: 56  Temp: 36 C  Resp: 11    Complications: No apparent anesthesia complications

## 2014-07-28 NOTE — Anesthesia Preprocedure Evaluation (Addendum)
Anesthesia Evaluation  Patient identified by MRN, date of birth, ID band Patient awake    Reviewed: Allergy & Precautions, NPO status , Patient's Chart, lab work & pertinent test results, reviewed documented beta blocker date and time   Airway Mallampati: III  TM Distance: >3 FB     Dental  (+) Chipped, Missing, Poor Dentition   Pulmonary former smoker,          Cardiovascular hypertension,     Neuro/Psych    GI/Hepatic   Endo/Other  diabetes  Renal/GU      Musculoskeletal   Abdominal   Peds  Hematology   Anesthesia Other Findings Overbite. Metoprolol this am.  Does not take citalopram. Pt. Has Afib. Rate controlled.  Reproductive/Obstetrics                            Anesthesia Physical Anesthesia Plan  ASA: III  Anesthesia Plan: General   Post-op Pain Management:    Induction: Intravenous  Airway Management Planned: LMA  Additional Equipment:   Intra-op Plan:   Post-operative Plan:   Informed Consent:   Plan Discussed with: CRNA  Anesthesia Plan Comments:         Anesthesia Quick Evaluation

## 2014-07-28 NOTE — Op Note (Signed)
     OPERATIVE NOTE   PROCEDURE: left brachiocephalic arteriovenous fistula placement  PRE-OPERATIVE DIAGNOSIS: End Stage Renal Disease  POST-OPERATIVE DIAGNOSIS: End Stage Renal Disease  SURGEON: Jerome Viglione, Latina CraverGregory G  ASSISTANT(S): Ms. Raul DelKim Stegmayer  ANESTHESIA: general  ESTIMATED BLOOD LOSS: Minimal cc  FINDING(S): Cephalic vein patent some scarring secondary to recent IV  SPECIMEN(S):  none  INDICATIONS:   [name] is a 39 y.o. male who presents with end stage renal disease.  The patient is scheduled for left brachiocephalic arteriovenous fistula placement.  The patient is aware the risks include but are not limited to: bleeding, infection, steal syndrome, nerve damage, ischemic monomelic neuropathy, failure to mature, and need for additional procedures.  The patient is aware of the risks of the procedure and elects to proceed forward.  DESCRIPTION: After full informed written consent was obtained from the patient, the patient was brought back to the operating room and placed supine upon the operating table.  Prior to induction, the patient received IV antibiotics.   After obtaining adequate anesthesia, the patient was then prepped and draped in the standard fashion for a left arm access procedure.  I turned my attention first to identifying the patient's cephalic vein and brachial artery.  Local anesthetic was injected to obtain a field block of the antecubital fossa.  In total, I injected about 10 mL of a  0.25% Marcaine without epinephrine.  I made a curvilinear incision at the level of the antecubitum and dissected through the subcutaneous tissue and fascia to gain exposure of the brachial artery.  This was dissected out proximally and distally and controlled with vessel loops .  I then dissected out the cephalic vein.  This was noted to be 3.5 mm in diameter externally.  The distal segment of the vein was ligated with a  2-0 silk, and the vein was transected.   Heparinized saline was infused via a Marx tip into the vein and clamped it.  The brachial artery was controlled with Silastic Vesseloops. I made an arteriotomy with a #11 blade, and then I extended the arteriotomy with a Potts scissor.  I injected heparinized saline proximal and distal to this arteriotomy.  The vein was then sewn to the artery in an end-to-side configuration with a running stitch of 6-0 Prolene.  Prior to completing this anastomosis, I allowed the vein and artery to backbleed.  There was no evidence of clot from any vessels.  I completed the anastomosis in the usual fashion and then released the bulldog from the vein and the distal loop around the brachial artery allowing backbleeding into the fistula and finally the proximal vessel loop was released allowing forward flow.  There was a palpable  thrill in the venous outflow, and there was 2+ palpable radial pulse.  At this point, I irrigated out the surgical wound.  There was no further active bleeding.  The subcutaneous tissue was reapproximated with a running stitch of 3-0 Vicryl.  The skin was then reapproximated with a running subcuticular stitch of 4-0 Monocryl.  The skin was then cleaned, dried, and reinforced with Dermabond.  The patient tolerated this procedure well.   COMPLICATIONS: None  CONDITION: [name]   [name] G. Hayk Divis, MD [facility]  Office: [phone]   07/28/2014, 9:09 AM

## 2014-07-28 NOTE — Anesthesia Postprocedure Evaluation (Signed)
  Anesthesia Post-op Note  Patient: [name]  Procedure(s) Performed: Procedure(s): ARTERIOVENOUS (AV) FISTULA CREATION (Left)  Anesthesia type:General  Patient location: PACU  Post pain: Pain level controlled  Post assessment: Post-op Vital signs reviewed, Patient's Cardiovascular Status Stable, Respiratory Function Stable, Patent Airway and No signs of Nausea or vomiting  Post vital signs: Reviewed and stable  Last Vitals:  Filed Vitals:   07/28/14 1147  BP: 139/73  Pulse: 57  Temp: 36.6 C  Resp: 16    Level of consciousness: awake, alert  and patient cooperative  Complications: No apparent anesthesia complications

## 2014-07-28 NOTE — Discharge Instructions (Addendum)
AMBULATORY SURGERY  DISCHARGE INSTRUCTIONS   1) The drugs that you were given will stay in your system until tomorrow so for the next 24 hours you should not:  A) Drive an automobile B) Make any legal decisions C) Drink any alcoholic beverage   2) You may resume regular meals tomorrow.  Today it is better to start with liquids and gradually work up to solid foods.  You may eat anything you prefer, but it is better to start with liquids, then soup and crackers, and gradually work up to solid foods.   3) Please notify your doctor immediately if you have any unusual bleeding, trouble breathing, redness and pain at the surgery site, drainage, fever, or pain not relieved by medication. 4)   5) Your post-operative visit with Dr.                                     is: Date:                        Time:    Please call to schedule your post-operative visit.  6) Additional Instructions: AV Fistula, Care After Refer to this sheet in the next few weeks. These instructions provide you with information on caring for yourself after your procedure. Your caregiver may also give you more specific instructions. Your treatment has been planned according to current medical practices, but problems sometimes occur. Call your caregiver if you have any problems or questions after your procedure. HOME CARE INSTRUCTIONS  Do not drive a car or take public transportation alone. Do not drink alcohol. Only take medicine that has been prescribed by your caregiver. Do not sign important papers or make important decisions. Have a responsible person with you. Ask your caregiver to show you how to check your access at home for a vibration (called a "thrill") or for a sound (called a "bruit" pronounced brew-ee). Your vein will need time to enlarge and mature so needles can be inserted for dialysis. Follow your caregiver's instructions about what you need to do to make this happen. Keep dressings clean and dry. Keep  the arm elevated above your heart. Use a pillow. Rest. Use the arm as usual for all activities. Have the stitches or tape closures removed in 10 to 14 days, or as directed by your caregiver. Do not sleep or lie on the area of the fistula or that arm. This may decrease or stop the blood flow through your fistula. Do not allow blood pressures to be taken on this arm. Do not allow blood drawing to be done from the graft. Do not wear tight clothing around the access site or on the arm. Avoid lifting heavy objects with the arm that has the fistula. Do not use creams or lotions over the access site. SEEK MEDICAL CARE IF:  You have a fever. You have swelling around the fistula that gets worse, or you have new pain. You have unusual bleeding at the fistula site or from any other area. You have pus or other drainage at the fistula site. You have skin redness or red streaking on the skin around, above, or below the fistula site. Your access site feels warm. You have any flu-like symptoms. SEEK IMMEDIATE MEDICAL CARE IF:  You have pain, numbness, or an unusual pale skin on the hand or on the side of your fistula. You have dizziness  or weakness that you have not had before. You have shortness of breath. You have chest pain. Your fistula disconnects or breaks, and there is bleeding that cannot be easily controlled. Call for local emergency medical help. Do not try to drive yourself to the hospital. MAKE SURE YOU Understand these instructions. Will watch your condition. Will get help right away if you are not doing well or get worse. Document Released: 02/10/2005 Document Revised: 06/27/2013 Document Reviewed: 07/31/2010 [facility] Patient Information 2015 [employer] [place]. This information is not intended to replace advice given to you by your health care provider. Make sure you discuss any questions you have with your health care provider. AMBULATORY SURGERY  DISCHARGE INSTRUCTIONS   The drugs  that you were given will stay in your system until tomorrow so for the next 24 hours you should not:  Drive an automobile Make any legal decisions Drink any alcoholic beverage   You may resume regular meals tomorrow.  Today it is better to start with liquids and gradually work up to solid foods.  You may eat anything you prefer, but it is better to start with liquids, then soup and crackers, and gradually work up to solid foods.   Please notify your doctor immediately if you have any unusual bleeding, trouble breathing, redness and pain at the surgery site, drainage, fever, or pain not relieved by medication.   Your post-operative visit with Dr.    Druscilla BrowniePorfilio                                 is: Date:                        Time:    Please call to schedule your post-operative visit.  Additional Instructions:

## 2014-07-28 NOTE — H&P (Signed)
[facility] History & Physical Update  The patient was interviewed and re-examined.  The patient's previous History and Physical has been reviewed and is unchanged.  There is no change in the plan of care.  Schnier, Latina CraverGregory G, MD  07/28/2014, 7:28 AM

## 2014-10-23 ENCOUNTER — Encounter
Admission: RE | Disposition: A | Discharge status: Home / Self Care | Payer: Self-pay | Source: Ambulatory Visit | Attending: Vascular Surgery

## 2014-10-23 ENCOUNTER — Ambulatory Visit
Admission: RE | Admit: 2014-10-23 | Discharge: 2014-10-23 | Disposition: A | Discharge status: Home / Self Care | Payer: Medicaid Other | Source: Ambulatory Visit | Attending: Vascular Surgery | Admitting: Vascular Surgery

## 2014-10-23 ENCOUNTER — Encounter: Payer: Self-pay | Admitting: *Deleted

## 2014-10-23 DIAGNOSIS — N186 End stage renal disease: Secondary | ICD-10-CM | POA: Diagnosis not present

## 2014-10-23 DIAGNOSIS — E1122 Type 2 diabetes mellitus with diabetic chronic kidney disease: Secondary | ICD-10-CM | POA: Diagnosis not present

## 2014-10-23 DIAGNOSIS — Z992 Dependence on renal dialysis: Secondary | ICD-10-CM | POA: Diagnosis not present

## 2014-10-23 DIAGNOSIS — Z794 Long term (current) use of insulin: Secondary | ICD-10-CM | POA: Diagnosis not present

## 2014-10-23 DIAGNOSIS — Z4901 Encounter for fitting and adjustment of extracorporeal dialysis catheter: Secondary | ICD-10-CM | POA: Diagnosis not present

## 2014-10-23 DIAGNOSIS — I120 Hypertensive chronic kidney disease with stage 5 chronic kidney disease or end stage renal disease: Secondary | ICD-10-CM | POA: Diagnosis not present

## 2014-10-23 DIAGNOSIS — F17200 Nicotine dependence, unspecified, uncomplicated: Secondary | ICD-10-CM | POA: Diagnosis not present

## 2014-10-23 DIAGNOSIS — Z79899 Other long term (current) drug therapy: Secondary | ICD-10-CM | POA: Insufficient documentation

## 2014-10-23 DIAGNOSIS — R1013 Epigastric pain: Secondary | ICD-10-CM

## 2014-10-23 HISTORY — PX: PERIPHERAL VASCULAR CATHETERIZATION: SHX172C

## 2014-10-23 SURGERY — DIALYSIS/PERMA CATHETER REMOVAL
Anesthesia: Moderate Sedation

## 2014-10-23 SURGICAL SUPPLY — 1 items: TRAY LACERAT/PLASTIC (MISCELLANEOUS) ×3 IMPLANT

## 2014-10-23 NOTE — H&P (Signed)
[facility] Admission History & Physical  MRN : [id number]  [name] is a 39 y.o. (Oct 08, 1975) male who presents with chief complaint of No chief complaint on file. [name]  History of Present Illness: Patient with ESRD, and his AVF is now working well.  Dialysis center sent him over to have his catheter removed.  He has no complaints  No current facility-administered medications for this encounter.    Past Medical History  Diagnosis Date  . Chronic kidney disease   . Type 1 diabetes   . Hypertension   . GERD (gastroesophageal reflux disease)     Past Surgical History  Procedure Laterality Date  . Insertion of dialysis catheter  11/2012  . Esophagogastroduodenoscopy N/A 04/08/2013    Procedure: ESOPHAGOGASTRODUODENOSCOPY (EGD);  Surgeon: West Bali, MD;  Location: [facility];  Service: Endoscopy;  Laterality: N/A;  8:30  . Av fistula placement Left 07/28/2014    Procedure: ARTERIOVENOUS (AV) FISTULA CREATION;  Surgeon: Renford Dills, MD;  Location: [facility] ORS;  Service: Vascular;  Laterality: Left;    Social History Social History  Substance Use Topics  . Smoking status: Former Smoker -- 0.50 packs/day    Types: Cigarettes  . Smokeless tobacco: Former Neurosurgeon    Quit date: 10/25/2013  . Alcohol Use: No  No IVDU  Family History Family History  Problem Relation Age of Onset  . Colon cancer Neg Hx   no bleeding disorders, clotting disorders, autoimmune diseases, or porphyrias  No Known Allergies   REVIEW OF SYSTEMS (Negative unless checked)  Constitutional: Weight loss  Fever  Chills Cardiac: Chest pain   Chest pressure   Palpitations   Shortness of breath when laying flat   Shortness of breath at rest   Shortness of breath with exertion. Vascular:  Pain in legs with walking   Pain in legs at rest   Pain in legs when laying flat   Claudication   Pain in feet when walking  Pain in feet at rest  Pain in  feet when laying flat   History of DVT   Phlebitis   Swelling in legs   Varicose veins   Non-healing ulcers Pulmonary:   Uses home oxygen   Productive cough   Hemoptysis   Wheeze  COPD   Asthma Neurologic:  Dizziness  Blackouts   Seizures   History of stroke   History of TIA  Aphasia   Temporary blindness   Dysphagia   Weakness or numbness in arms   Weakness or numbness in legs Musculoskeletal:  Arthritis   Joint swelling   Joint pain   Low back pain Hematologic:  Easy bruising  Easy bleeding   Hypercoagulable state   Anemic  Hepatitis Gastrointestinal:  Blood in stool   Vomiting blood  Gastroesophageal reflux/heartburn   Difficulty swallowing. Genitourinary:  Chronic kidney disease   Difficult urination  Frequent urination  Burning with urination   Blood in urine Skin:  Rashes   Ulcers   Wounds Psychological:  History of anxiety    History of major depression.  Physical Examination  Filed Vitals:   10/23/14 1027  Pulse: 64  Temp: 98.1 F (36.7 C)  TempSrc: Oral  Height: 6' (1.829 m)  Weight: 99.791 kg (220 lb)   Body mass index is 29.83 kg/(m^2). Gen: WD/WN, NAD Head: Tolstoy/AT, No temporalis wasting. Prominent temp pulse not noted. Ear/Nose/Throat: Hearing grossly intact, nares w/o erythema or drainage, oropharynx w/o Erythema/Exudate,  Eyes: PERRLA, EOMI.  Neck: Supple, no nuchal rigidity.  No bruit or JVD.  Pulmonary:  Good air movement, clear to auscultation bilaterally, no use of accessory muscles.  Cardiac: RRR, normal S1, S2, no Murmurs, rubs or gallops. Vascular: thrill and bruit present in left arm AVF Vessel Right Left  Radial Palpable Palpable                                   Gastrointestinal: soft, non-tender/non-distended. No guarding/reflex.  Musculoskeletal: M/S 5/5 throughout.  Extremities without ischemic changes.  No deformity or atrophy.  Neurologic: CN 2-12 intact.  Pain and light touch intact in extremities.  Symmetrical.   Psychiatric: Judgment intact, Mood & affect appropriate for pt's clinical situation. Dermatologic: No rashes or ulcers noted.  No cellulitis or open wounds. Lymph : No Cervical, Axillary, or Inguinal lymphadenopathy.       CBC Lab Results  Component Value Date   WBC 5.2 07/26/2014   HGB 11.2* 07/26/2014   HCT 34.4* 07/26/2014   MCV 84.9 07/26/2014   PLT 277 07/26/2014    BMET    Component Value Date/Time   NA 135 07/26/2014 1152   NA 137 11/10/2013 0904   K 5.0 07/26/2014 1152   K 4.5 11/10/2013 0904   CL 96* 07/26/2014 1152   CL 97* 11/10/2013 0904   CO2 26 07/26/2014 1152   CO2 28 11/10/2013 0904   GLUCOSE 181* 07/26/2014 1152   GLUCOSE 127* 11/10/2013 0904   BUN 62* 07/26/2014 1152   BUN 48* 11/10/2013 0904   BUN 56* 02/14/2013   CREATININE 9.49* 07/26/2014 1152   CREATININE 10.38* 11/10/2013 0904   CREATININE 9.85 02/14/2013   CALCIUM 8.1* 07/26/2014 1152   CALCIUM 7.1* 11/10/2013 0904   GFRNONAA 6* 07/26/2014 1152   GFRNONAA 6* 11/10/2013 0904   GFRAA 7* 07/26/2014 1152   GFRAA 6* 11/10/2013 0904   CrCl cannot be calculated (Patient has no serum creatinine result on file.).  COAG No results found for: INR, PROTIME  Radiology No results found.   Assessment/Plan ESRD: Access now working well.  Will remove his catheter today HTN: stable. Continue outpatient meds    DEW,JASON, MD  10/23/2014 11:13 AM

## 2014-10-23 NOTE — Op Note (Signed)
Operative Note     Preoperative diagnosis:   1. ESRD with functional permanent access  Postoperative diagnosis:  1. ESRD with functional permanent access  Procedure:  Removal of right jugular Permcath  Surgeon:  Festus Barren, MD  Anesthesia:  Local  EBL:  Minimal  Indication for the Procedure:  The patient has a functional permanent dialysis access and no longer needs their permcath.  This can be removed.  Risks and benefits are discussed and informed consent is obtained.  Description of the Procedure:  The patient's right neck, chest and existing catheter were sterilely prepped and draped. The area around the catheter was anesthetized copiously with 1% lidocaine. The catheter was dissected out with curved hemostats until the cuff was freed from the surrounding fibrous sheath. The fiber sheath was transected, and the catheter was then removed in its entirety using gentle traction. Pressure was held and sterile dressings were placed. The patient tolerated the procedure well and was taken to the recovery room in stable condition.     Curry Seefeldt  10/23/2014, 12:22 PM

## 2014-10-23 NOTE — Discharge Instructions (Signed)
Remove dressing in two days. In the event that site bleeds apply pressure, if bleeding doesn't stop within 10 minutes call Physician at [phone]. Or go to the local emergency room. In the event that you develop life threatening symptoms call 911 or go to local emergency room.

## 2014-10-24 ENCOUNTER — Encounter: Payer: Self-pay | Admitting: Vascular Surgery

## 2014-11-15 DIAGNOSIS — E109 Type 1 diabetes mellitus without complications: Secondary | ICD-10-CM | POA: Insufficient documentation

## 2014-11-15 DIAGNOSIS — I519 Heart disease, unspecified: Secondary | ICD-10-CM | POA: Insufficient documentation

## 2014-11-15 DIAGNOSIS — K2970 Gastritis, unspecified, without bleeding: Secondary | ICD-10-CM | POA: Insufficient documentation

## 2014-11-15 DIAGNOSIS — K2100 Gastro-esophageal reflux disease with esophagitis, without bleeding: Secondary | ICD-10-CM | POA: Insufficient documentation

## 2014-11-15 DIAGNOSIS — Z87891 Personal history of nicotine dependence: Secondary | ICD-10-CM | POA: Insufficient documentation

## 2014-11-15 DIAGNOSIS — I151 Hypertension secondary to other renal disorders: Secondary | ICD-10-CM | POA: Insufficient documentation

## 2015-02-25 HISTORY — PX: TRANSPLANTATION RENAL: SUR1385

## 2015-06-01 DIAGNOSIS — IMO0002 Reserved for concepts with insufficient information to code with codable children: Secondary | ICD-10-CM | POA: Insufficient documentation

## 2015-06-01 DIAGNOSIS — Z992 Dependence on renal dialysis: Secondary | ICD-10-CM | POA: Insufficient documentation

## 2015-06-01 DIAGNOSIS — N186 End stage renal disease: Secondary | ICD-10-CM | POA: Insufficient documentation

## 2015-06-01 DIAGNOSIS — E1065 Type 1 diabetes mellitus with hyperglycemia: Secondary | ICD-10-CM | POA: Insufficient documentation

## 2015-09-05 IMAGING — XA IR VASCULAR PROCEDURE
1 series · 1 of 1 positions shown · non-contrast
Comparison: none

[Series 1: single · 1 of 1 slices shown]
[im 1/1]
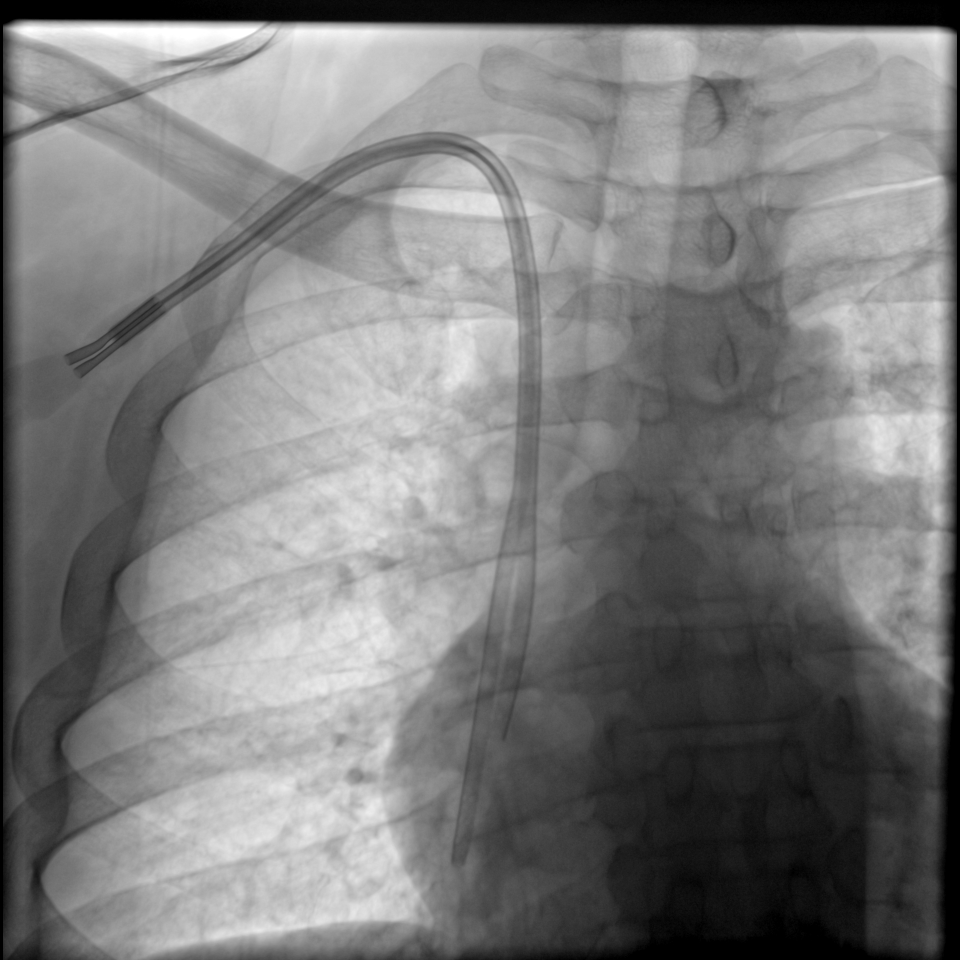

[1 of 1 positions shown; findings below may reference images not displayed]

IMAGES IMPORTED FROM THE SYNGO WORKFLOW SYSTEM
NO DICTATION FOR STUDY

## 2015-09-05 IMAGING — US US RENAL KIDNEY
1 series · 14 of 25 positions shown · non-contrast
Comparison: none

REASON FOR EXAM: ESRD
COMMENTS:

[Series 1: us renal kidney · 0.28mm/px · 14 of 31 slices shown]
[im 1/31]
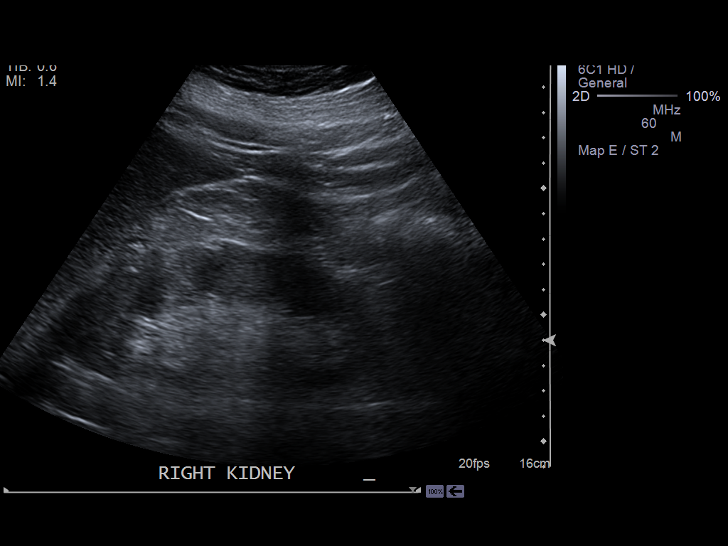
[im 3/31]
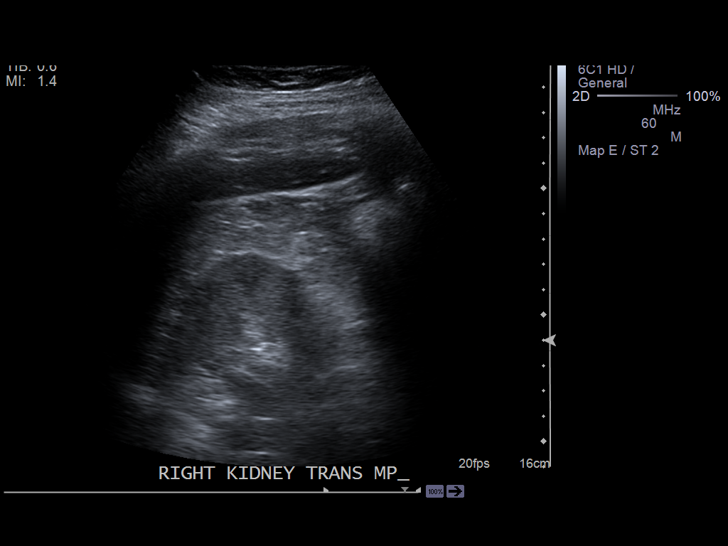
[im 6/31]
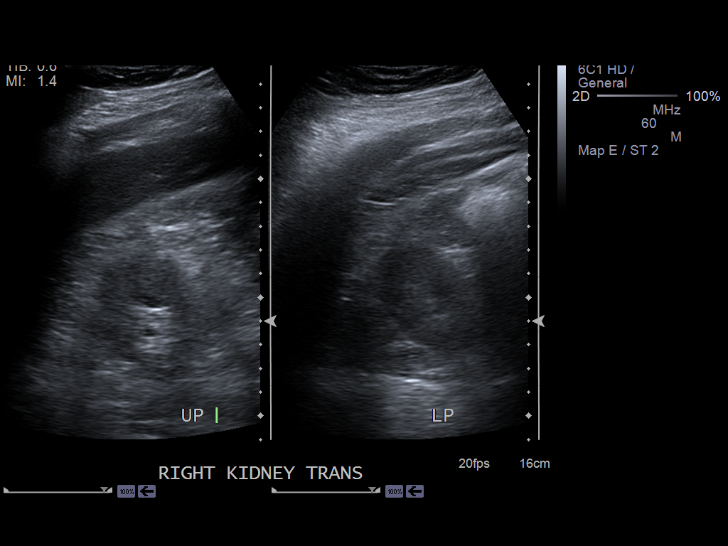
[im 8/31]
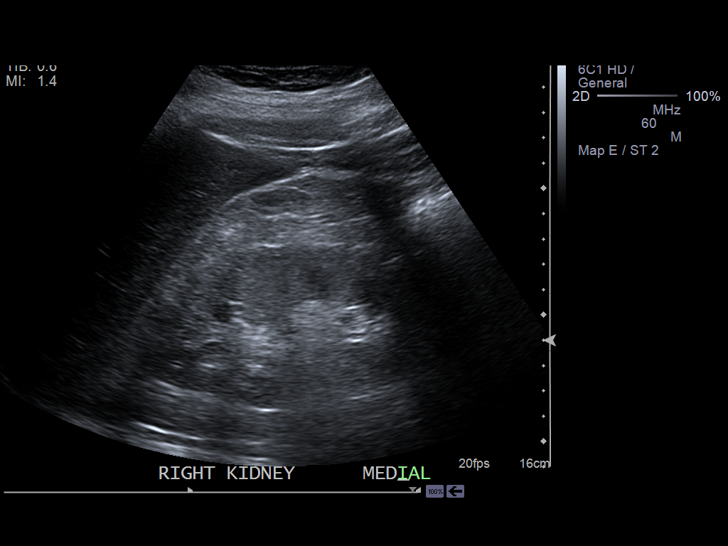
[im 11/31]
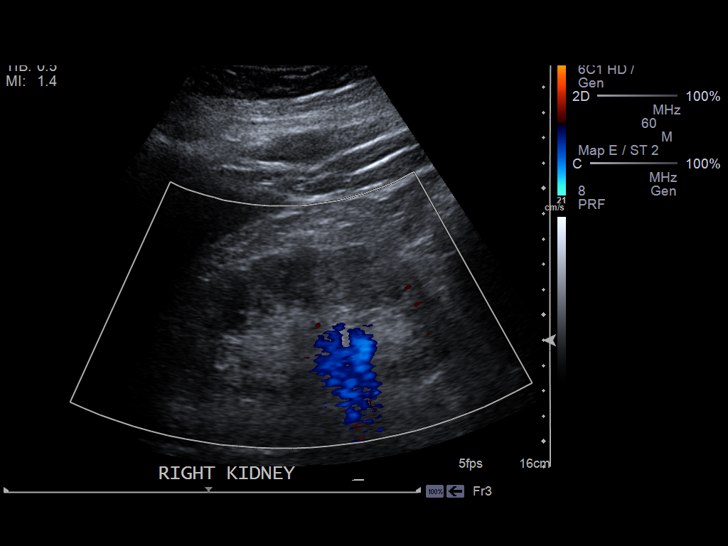
[im 12/31]
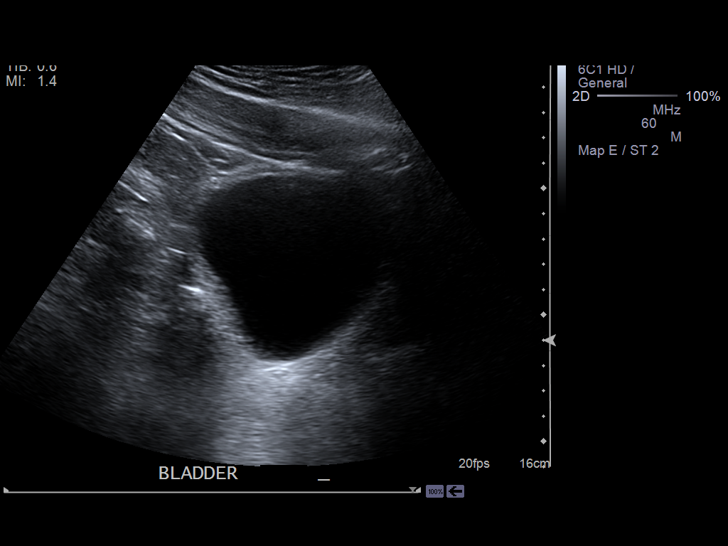
[im 14/31]
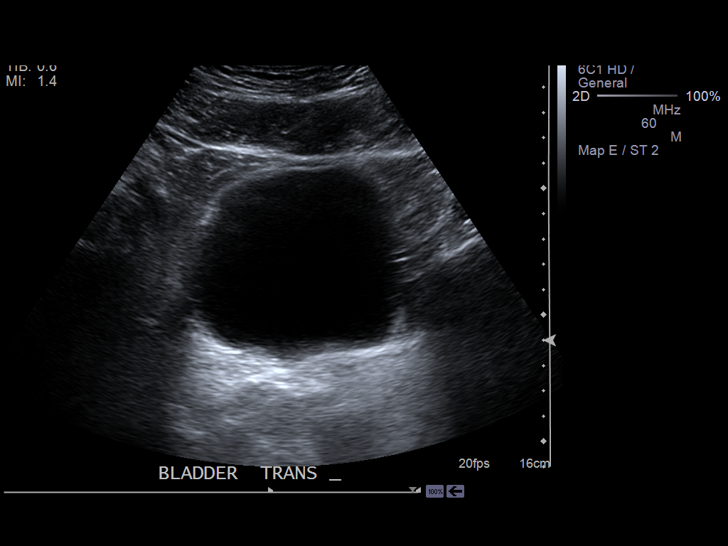
[im 17/31]
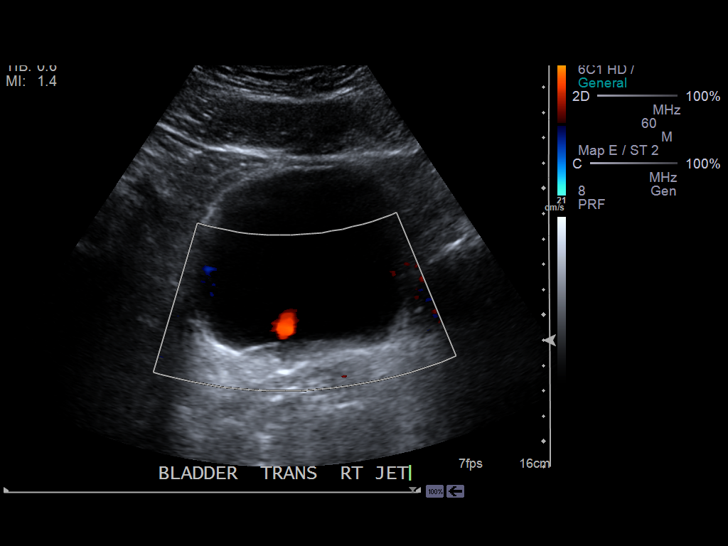
[im 19/31]
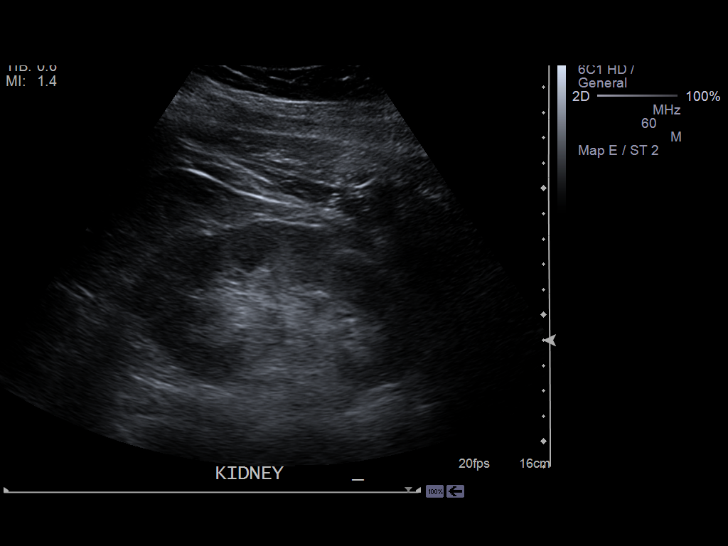
[im 21/31]
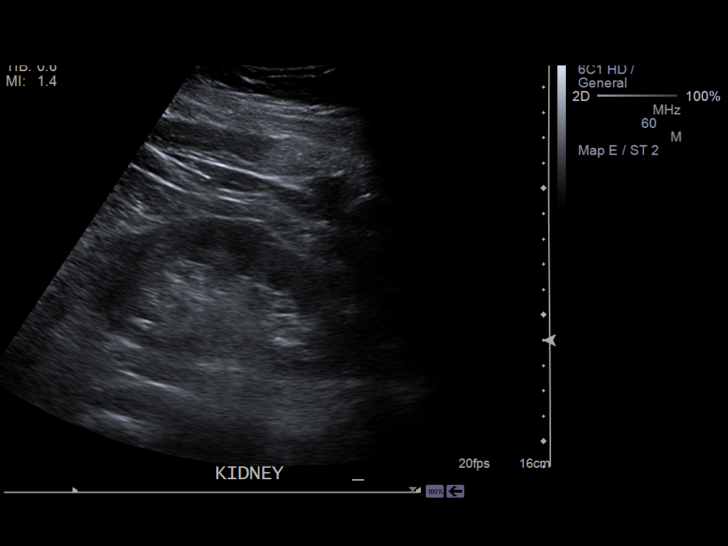
[im 23/31]
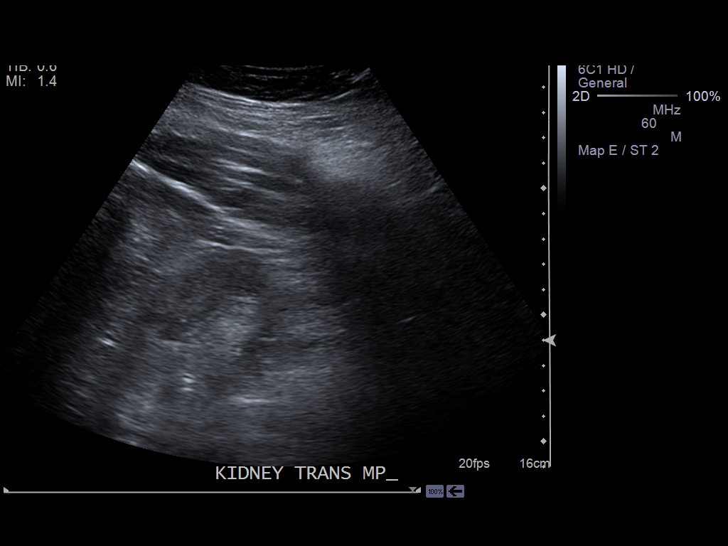
[im 26/31]
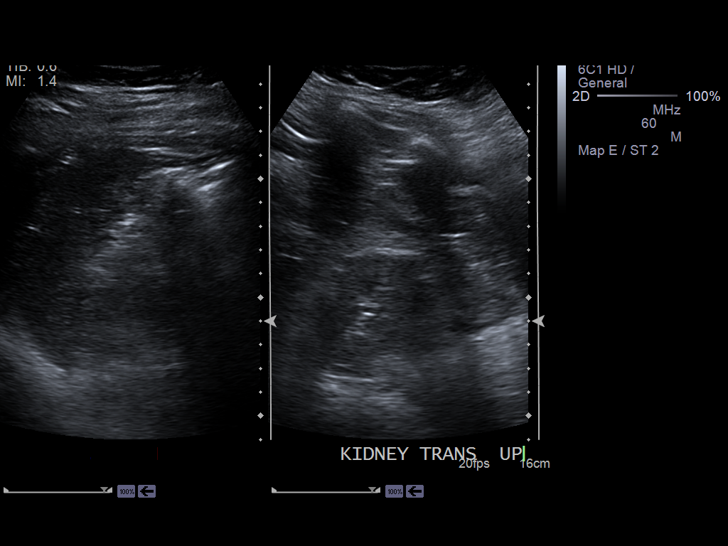
[im 28/31]
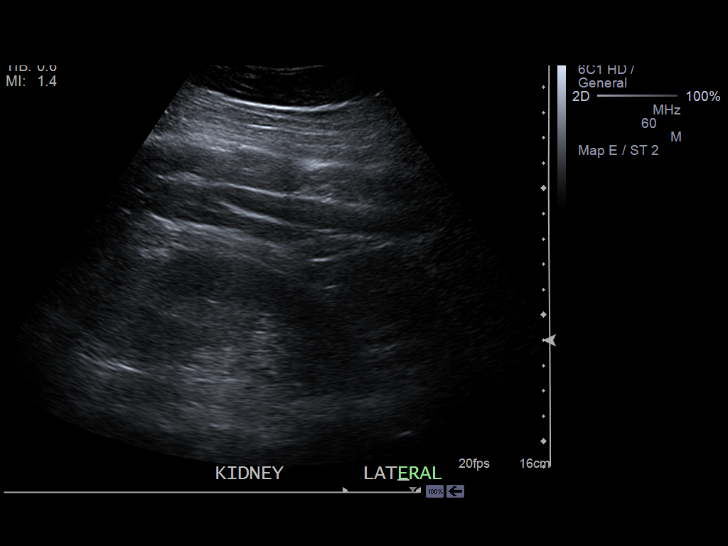
[im 31/31]
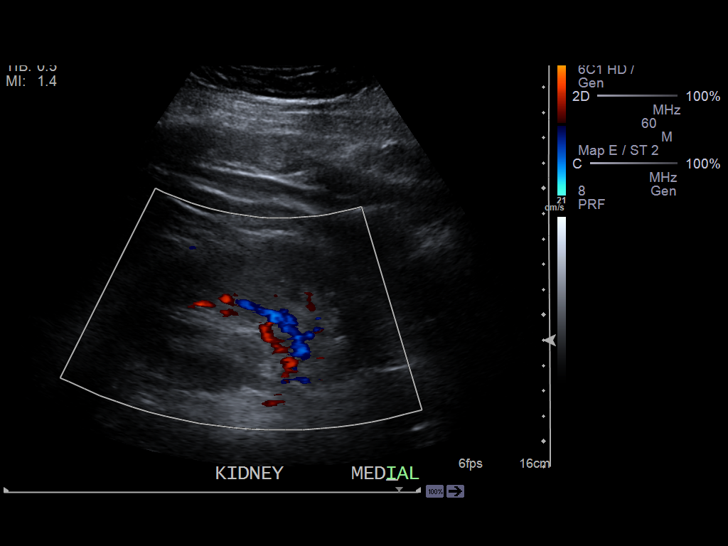

[14 of 25 positions shown; findings below may reference images not displayed]

PROCEDURE:     US  - US KIDNEY  - December 08, 2012  [DATE]

RESULT:     The kidneys demonstrate a normal reniform contour. The right
kidney measures 13.2 x 5.4 x 5.3 cm. The left kidney measures 12 x 5.5 x
cm. There is no cystic or solid mass. The echotexture of the renal cortex on
the right is increased and is greater than that of the adjacent liver. There
is no evidence of obstruction nor of shadowing calcified stones. Survey
views of the urinary bladder are normal. Ureteral jets are demonstrated
bilaterally.
IMPRESSION: 1. Increased echotexture of both kidneys is consistent with medical renal
disease. There is no significant atrophy of the kidneys.
2. There is no evidence of obstruction. No cystic or solid masses are
demonstrated.

[REDACTED]

## 2016-01-28 DIAGNOSIS — Z940 Kidney transplant status: Secondary | ICD-10-CM | POA: Insufficient documentation

## 2016-02-28 DIAGNOSIS — E1010 Type 1 diabetes mellitus with ketoacidosis without coma: Secondary | ICD-10-CM | POA: Insufficient documentation

## 2016-02-28 DIAGNOSIS — I10 Essential (primary) hypertension: Secondary | ICD-10-CM | POA: Insufficient documentation

## 2016-02-28 DIAGNOSIS — G894 Chronic pain syndrome: Secondary | ICD-10-CM | POA: Insufficient documentation

## 2016-05-14 DIAGNOSIS — Z48298 Encounter for aftercare following other organ transplant: Secondary | ICD-10-CM | POA: Insufficient documentation

## 2016-08-29 MED ORDER — PROGRAF 1 MG CAPSULE
ORAL_CAPSULE | Freq: Two times a day (BID) | ORAL | 11 refills | 0 days
Start: 2016-08-29 — End: 2017-08-11

## 2016-08-29 MED ORDER — MYFORTIC 180 MG TABLET,DELAYED RELEASE
ORAL_TABLET | Freq: Two times a day (BID) | ORAL | 11 refills | 0 days
Start: 2016-08-29 — End: 2016-09-16

## 2016-09-16 MED ORDER — MYFORTIC 180 MG TABLET,DELAYED RELEASE
ORAL_TABLET | Freq: Two times a day (BID) | ORAL | 11 refills | 0.00000 days
Start: 2016-09-16 — End: 2016-10-01

## 2016-09-23 MED ORDER — VALGANCICLOVIR 450 MG TABLET
ORAL_TABLET | Freq: Every day | ORAL | 3 refills | 0 days | Status: CP
Start: 2016-09-23 — End: 2016-10-17

## 2016-09-24 ENCOUNTER — Ambulatory Visit
Admission: RE | Admit: 2016-09-24 | Discharge: 2016-09-24 | Disposition: A | Discharge status: Home / Self Care | Payer: MEDICAID

## 2016-09-24 DIAGNOSIS — D709 Neutropenia, unspecified: Principal | ICD-10-CM

## 2016-09-24 MED ORDER — AMLODIPINE 10 MG TABLET
ORAL_TABLET | Freq: Every day | ORAL | 3 refills | 0.00000 days | Status: CP
Start: 2016-09-24 — End: 2017-12-02

## 2016-09-24 MED ORDER — INSULIN SYRINGE U-100 WITH NEEDLE 0.5 ML 29 GAUGE X 1/2" (12 MM)
5 refills | 0 days | Status: CP
Start: 2016-09-24 — End: 2017-12-31

## 2016-10-01 MED ORDER — MYFORTIC 180 MG TABLET,DELAYED RELEASE
ORAL_TABLET | Freq: Two times a day (BID) | ORAL | 11 refills | 0 days | Status: CP
Start: 2016-10-01 — End: 2016-10-17

## 2016-10-15 ENCOUNTER — Ambulatory Visit
Admission: RE | Admit: 2016-10-15 | Discharge: 2016-10-15 | Disposition: A | Discharge status: Home / Self Care | Payer: MEDICAID

## 2016-10-15 ENCOUNTER — Ambulatory Visit
Admission: RE | Admit: 2016-10-15 | Discharge: 2016-10-15 | Disposition: A | Discharge status: Home / Self Care | Payer: MEDICAID | Admitting: Nephrology

## 2016-10-15 DIAGNOSIS — E119 Type 2 diabetes mellitus without complications: Secondary | ICD-10-CM

## 2016-10-15 DIAGNOSIS — Z940 Kidney transplant status: Principal | ICD-10-CM

## 2016-10-17 MED ORDER — MYFORTIC 180 MG TABLET,DELAYED RELEASE
ORAL_TABLET | Freq: Two times a day (BID) | ORAL | 11 refills | 0 days
Start: 2016-10-17 — End: 2017-03-03

## 2016-10-17 MED ORDER — VALGANCICLOVIR 450 MG TABLET
ORAL_TABLET | Freq: Two times a day (BID) | ORAL | 3 refills | 0.00000 days
Start: 2016-10-17 — End: 2017-01-13

## 2016-12-30 IMAGING — CT CT HEAD WITHOUT CONTRAST
1 of 2 series · 15 of 30 positions shown, 19 images · non-contrast
Comparison: MRI brain 11/05/2013.  CT head 11/04/2013.

CLINICAL DATA: Nausea, vomiting, hematemesis. Elevated creatinine
and lipase. History of pancreatitis. High blood pressure and
dialysis.

EXAM:
CT HEAD WITHOUT CONTRAST
TECHNIQUE: Contiguous axial images were obtained from the base of the skull
through the vertex without intravenous contrast.

[Series 2: head wo · axial · 0.43mm/px · z∈[-113,+42]mm · 15 of 36 slices shown, 19 images]
[im 2/36  brain]
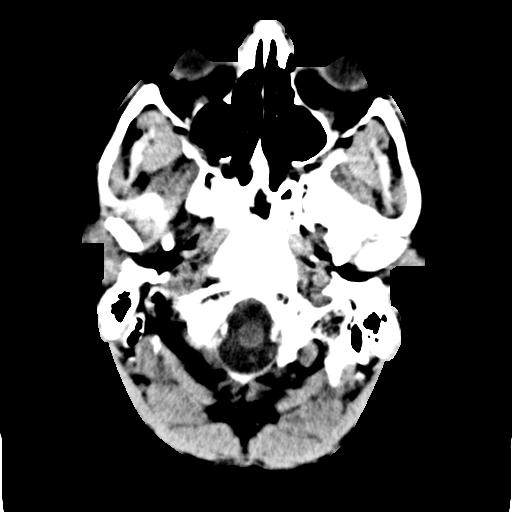
[im 2/36  bone]
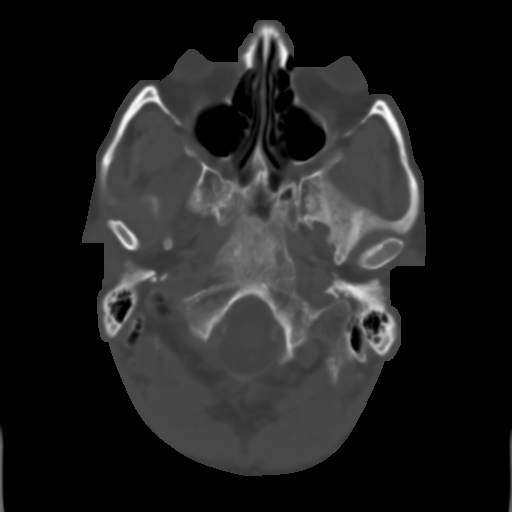
[im 6/36  brain]
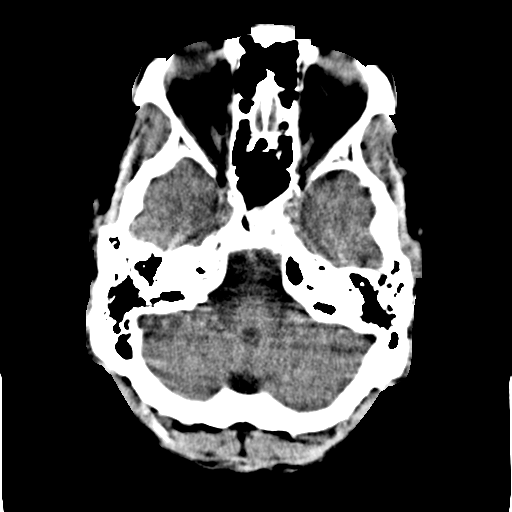
[im 7/36  brain]
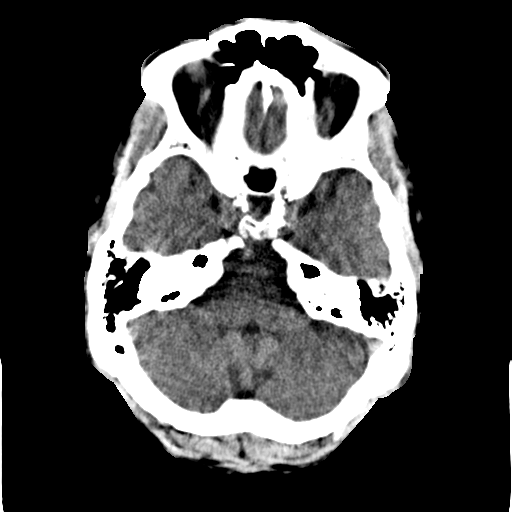
[im 9/36  brain]
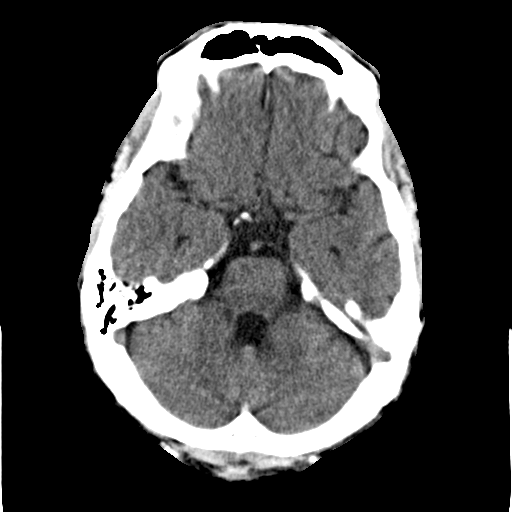
[im 12/36  brain]
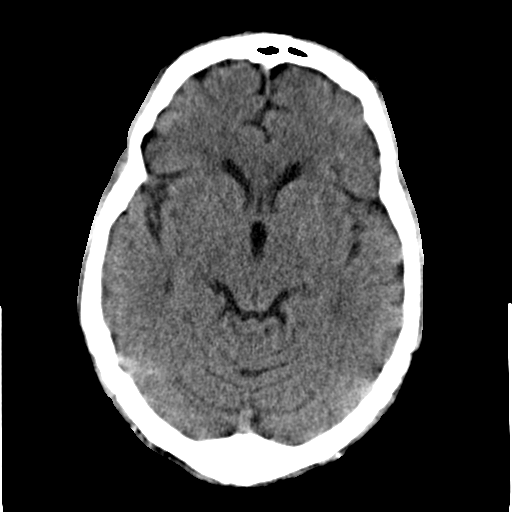
[im 12/36  bone]
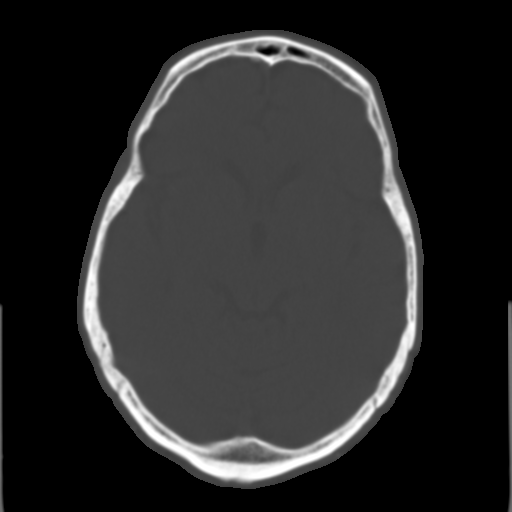
[im 14/36  brain]
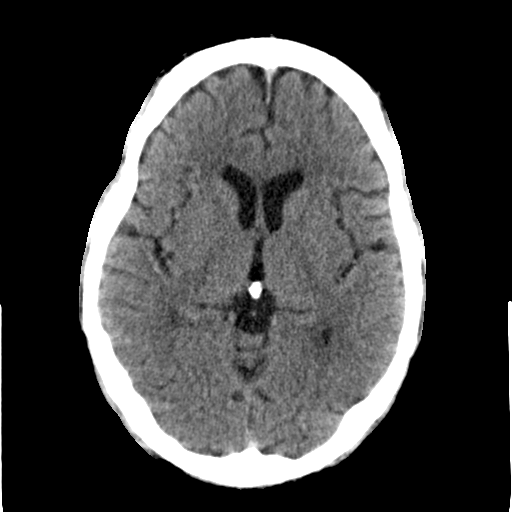
[im 16/36  brain]
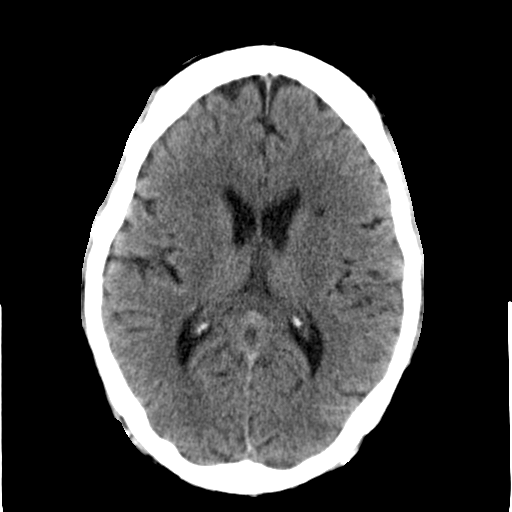
[im 19/36  brain]
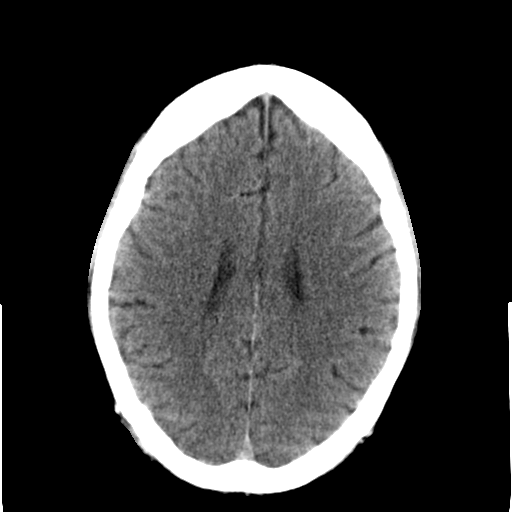
[im 21/36  brain]
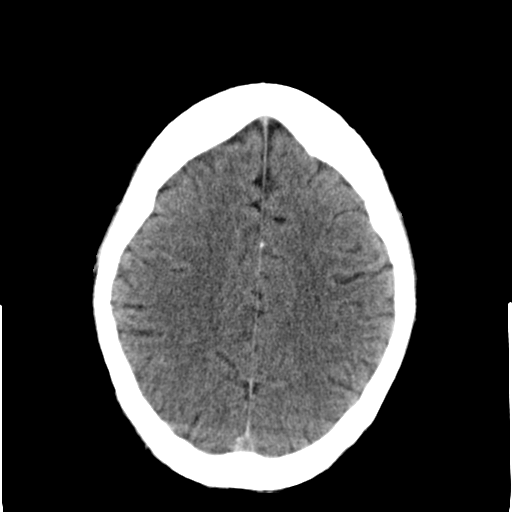
[im 21/36  bone]
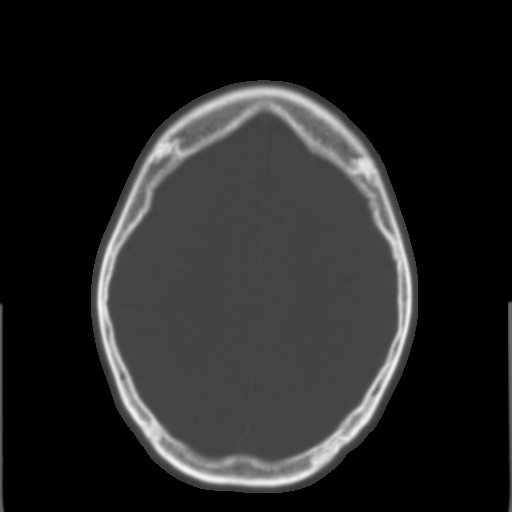
[im 22/36  brain]
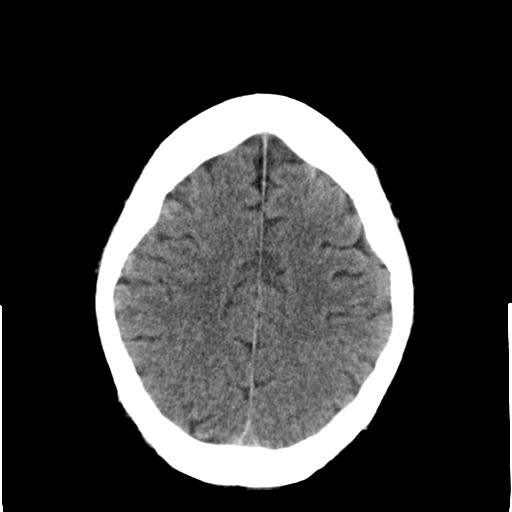
[im 26/36  brain]
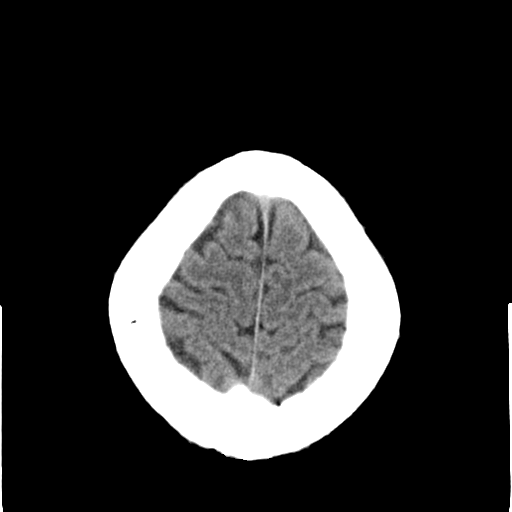
[im 27/36  brain]
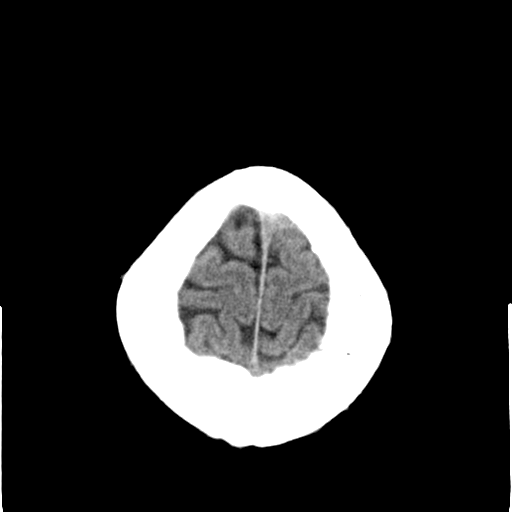
[im 29/36  brain]
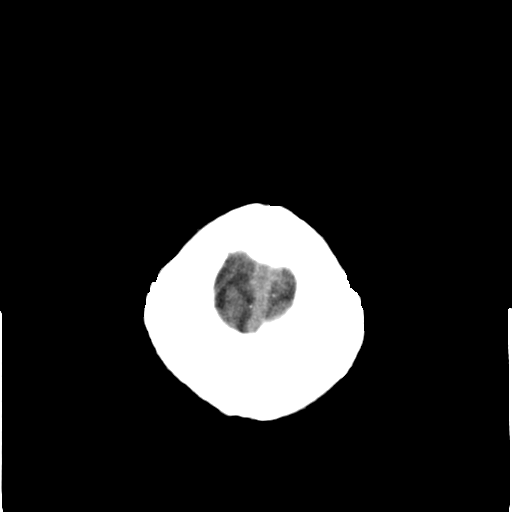
[im 29/36  bone]
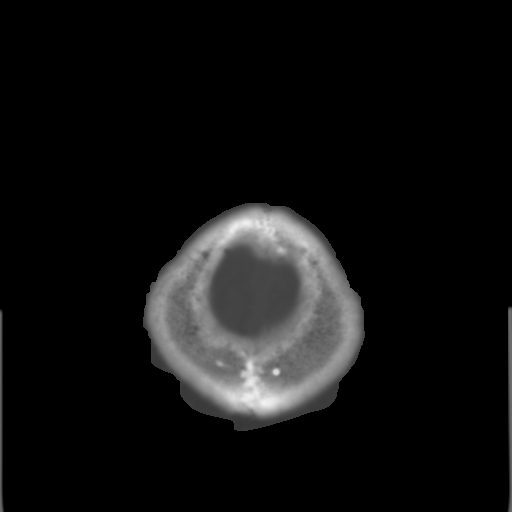
[im 32/36  brain]
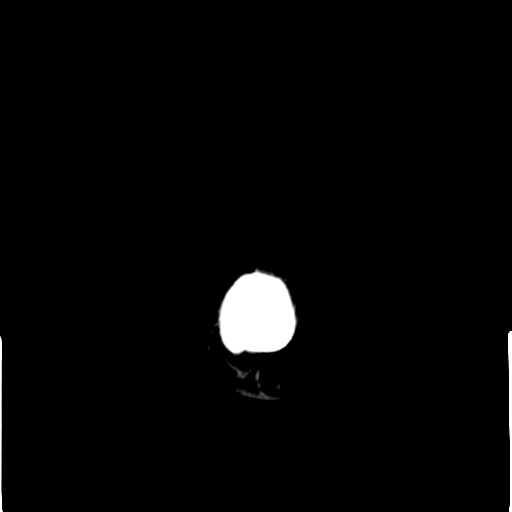
[im 34/36  brain]
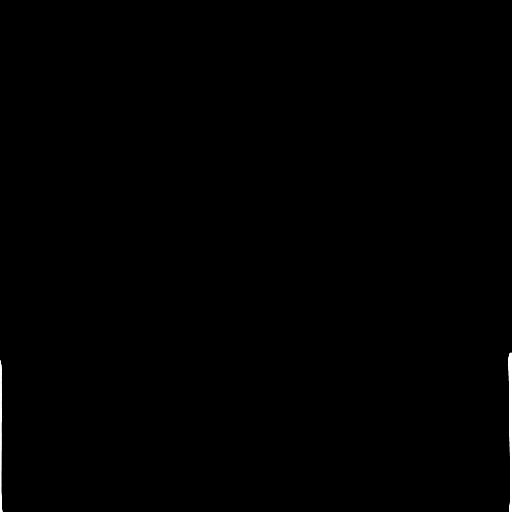

[15 of 30 positions shown; findings below may reference images not displayed]

FINDINGS: Ventricles and sulci appear symmetrical. Scattered punctate areas of
low attenuation in the deep white matter are nonspecific but could
indicate old lacune or infarcts or other white matter disease. No
mass effect or midline shift. No abnormal extra-axial fluid
collections. Gray-white matter junctions are distinct. Basal
cisterns are not effaced. No evidence of acute intracranial
hemorrhage. No depressed skull fractures. Retention cyst in the
sphenoid sinus. Mastoid air cells are not opacified. Vascular
calcifications.
IMPRESSION: Focal low-attenuation changes in the deep white matter suggesting
small vessel ischemia versus other white matter disease. No acute
intracranial hemorrhage or mass effect.

## 2016-12-30 IMAGING — CR DG CHEST 1V PORT
1 series · 1 of 1 positions shown · non-contrast
Comparison: 11/08/2013 and 11/04/2013

CLINICAL DATA: Initial evaluation for acute onset of hypoxia, renal
failure

EXAM:
PORTABLE CHEST - 1 VIEW

[ap]
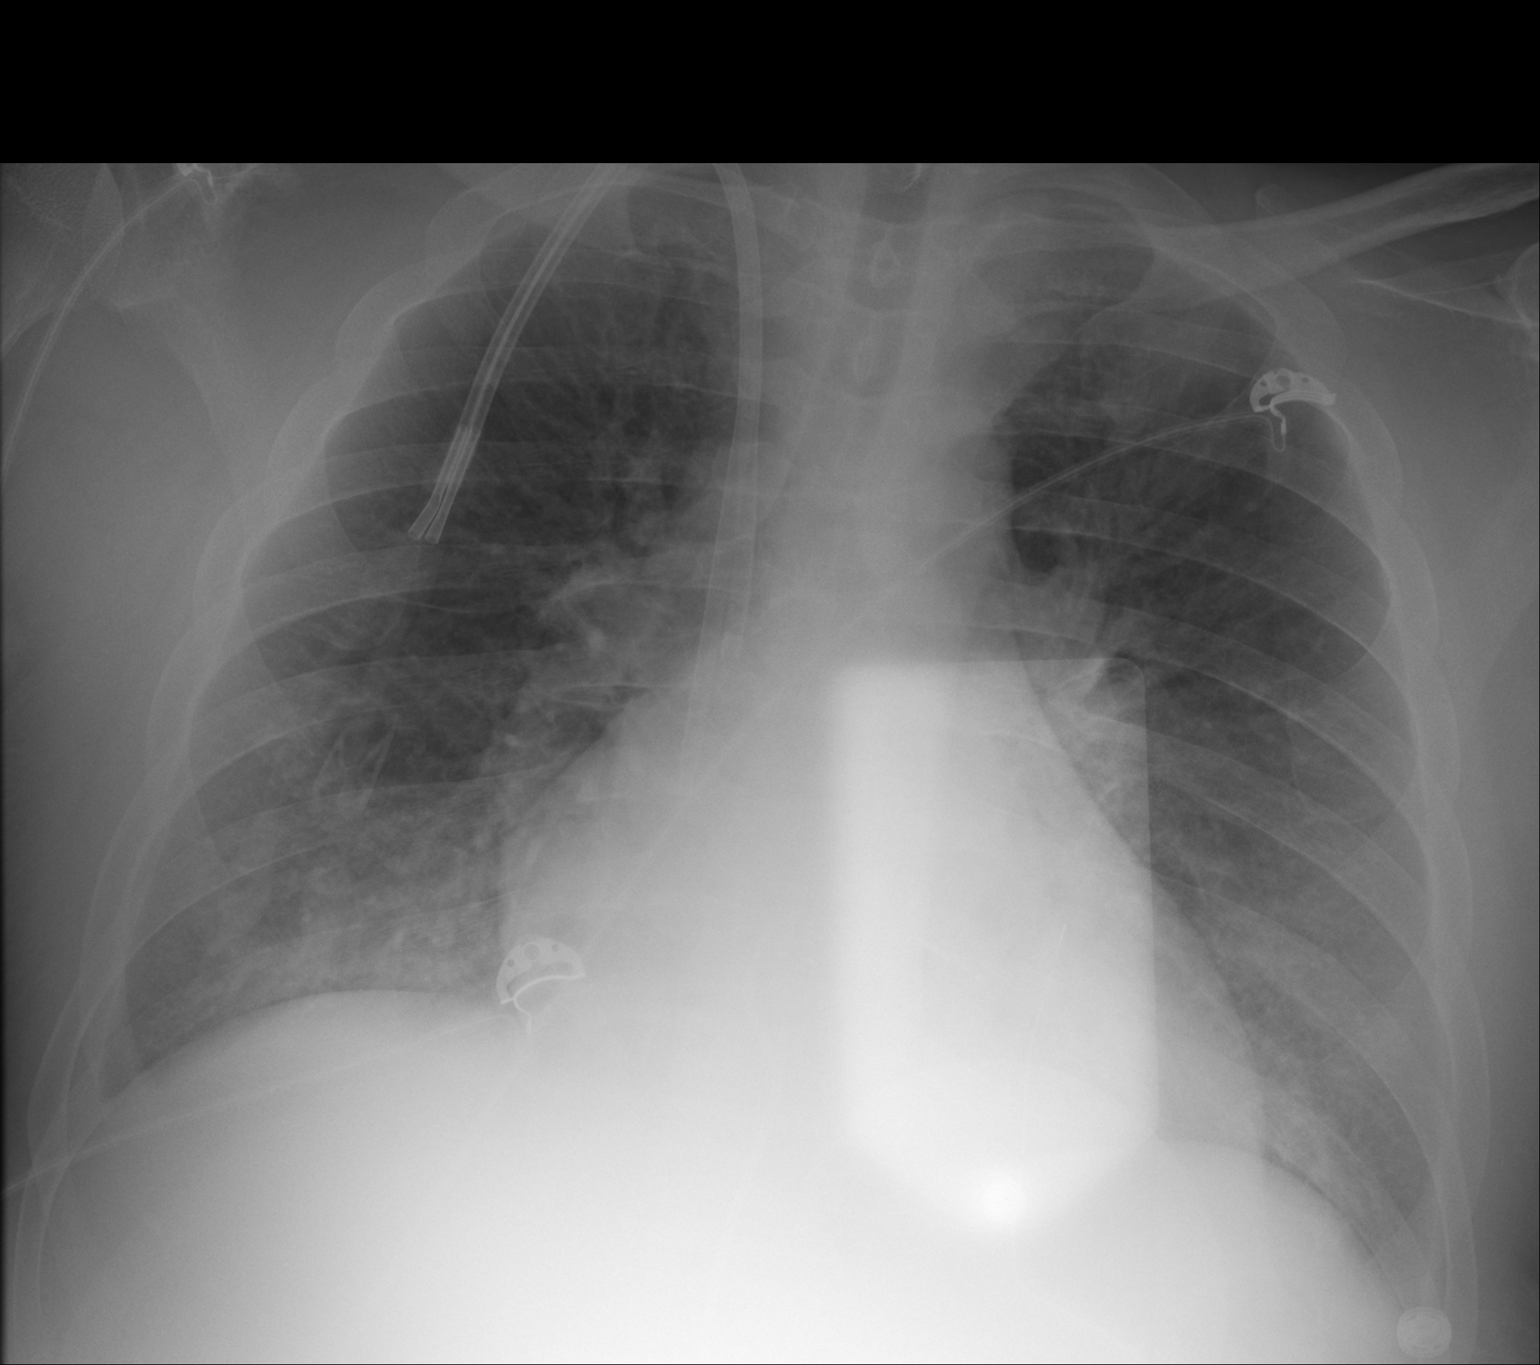

[1 of 1 positions shown; findings below may reference images not displayed]

FINDINGS: Mild cardiac enlargement and vascular congestion. Dialysis catheter
in unchanged position. Bilateral peribronchial cuffing. Mild hazy
density in both lower lobes.
IMPRESSION: Vascular congestion with mild hazy bilateral lower lobe opacity
which could represent developing pulmonary edema. The finding is
more prominent on the right in the possibility of developing
pneumonia in the appropriate clinical setting is not excluded.

## 2017-01-13 MED ORDER — VALGANCICLOVIR 450 MG TABLET
ORAL_TABLET | Freq: Every day | ORAL | 3 refills | 0.00000 days
Start: 2017-01-13 — End: 2017-03-03

## 2017-03-03 ENCOUNTER — Ambulatory Visit: Admit: 2017-03-03 | Discharge: 2017-03-03 | Payer: BLUE CROSS/BLUE SHIELD

## 2017-03-03 ENCOUNTER — Encounter: Admit: 2017-03-03 | Discharge: 2017-03-03 | Payer: BLUE CROSS/BLUE SHIELD | Attending: Nephrology | Primary: Nephrology

## 2017-03-03 ENCOUNTER — Encounter: Admit: 2017-03-03 | Discharge: 2017-03-03 | Payer: BLUE CROSS/BLUE SHIELD

## 2017-03-03 DIAGNOSIS — Z940 Kidney transplant status: Principal | ICD-10-CM

## 2017-03-03 DIAGNOSIS — Z0000 Encounter for general adult medical examination without abnormal findings: Secondary | ICD-10-CM

## 2017-03-03 DIAGNOSIS — E119 Type 2 diabetes mellitus without complications: Secondary | ICD-10-CM

## 2017-03-03 DIAGNOSIS — Z23 Encounter for immunization: Principal | ICD-10-CM

## 2017-03-03 MED ORDER — MYFORTIC 180 MG TABLET,DELAYED RELEASE
ORAL_TABLET | Freq: Two times a day (BID) | ORAL | 11 refills | 0 days | Status: CP
Start: 2017-03-03 — End: 2017-06-17

## 2017-03-05 MED ORDER — INSULIN GLARGINE (U-100) 100 UNIT/ML (3 ML) SUBCUTANEOUS PEN: 32 [IU] | Units | Freq: Every evening | 5 refills | 0 days | Status: AC

## 2017-03-05 MED ORDER — INSULIN GLARGINE (U-100) 100 UNIT/ML (3 ML) SUBCUTANEOUS PEN
Freq: Every evening | SUBCUTANEOUS | 5 refills | 0.00000 days | Status: CP
Start: 2017-03-05 — End: 2017-03-05

## 2017-04-08 MED ORDER — INSULIN LISPRO (U-100) 100 UNIT/ML SUBCUTANEOUS PEN: 8 [IU] | mL | Freq: Three times a day (TID) | 5 refills | 0 days | Status: AC

## 2017-04-08 MED ORDER — INSULIN LISPRO (U-100) 100 UNIT/ML SUBCUTANEOUS PEN
PEN_INJECTOR | Freq: Three times a day (TID) | SUBCUTANEOUS | 5 refills | 0 days | Status: CP
Start: 2017-04-08 — End: 2018-04-28

## 2017-04-10 MED ORDER — MAGNESIUM OXIDE 400 MG (241.3 MG MAGNESIUM) TABLET
ORAL_TABLET | 11 refills | 0 days | Status: CP
Start: 2017-04-10 — End: 2017-06-17

## 2017-05-28 ENCOUNTER — Encounter
Admit: 2017-05-28 | Discharge: 2017-05-29 | Payer: BLUE CROSS/BLUE SHIELD | Attending: General Practice | Primary: General Practice

## 2017-05-28 ENCOUNTER — Encounter: Admit: 2017-05-28 | Discharge: 2017-05-29 | Payer: BLUE CROSS/BLUE SHIELD

## 2017-05-28 DIAGNOSIS — Z0120 Encounter for dental examination and cleaning without abnormal findings: Principal | ICD-10-CM

## 2017-05-28 DIAGNOSIS — K056 Periodontal disease, unspecified: Secondary | ICD-10-CM

## 2017-05-28 DIAGNOSIS — K036 Deposits [accretions] on teeth: Principal | ICD-10-CM

## 2017-05-29 MED ORDER — ATORVASTATIN 40 MG TABLET
ORAL_TABLET | Freq: Every evening | ORAL | 11 refills | 0 days | Status: CP
Start: 2017-05-29 — End: 2018-06-04

## 2017-05-29 MED ORDER — BLOOD SUGAR DIAGNOSTIC STRIPS
ORAL_STRIP | 5 refills | 0 days | Status: CP
Start: 2017-05-29 — End: ?

## 2017-06-17 ENCOUNTER — Ambulatory Visit: Admit: 2017-06-17 | Discharge: 2017-06-17 | Payer: BLUE CROSS/BLUE SHIELD

## 2017-06-17 ENCOUNTER — Ambulatory Visit
Admit: 2017-06-17 | Discharge: 2017-06-18 | Payer: BLUE CROSS/BLUE SHIELD | Attending: General Practice | Primary: General Practice

## 2017-06-17 ENCOUNTER — Encounter
Admit: 2017-06-17 | Discharge: 2017-06-17 | Payer: BLUE CROSS/BLUE SHIELD | Attending: Pharmacist Clinician (PhC)/ Clinical Pharmacy Specialist | Primary: Pharmacist Clinician (PhC)/ Clinical Pharmacy Specialist

## 2017-06-17 ENCOUNTER — Encounter: Admit: 2017-06-17 | Discharge: 2017-06-17 | Payer: BLUE CROSS/BLUE SHIELD | Attending: Nephrology | Primary: Nephrology

## 2017-06-17 DIAGNOSIS — Z940 Kidney transplant status: Principal | ICD-10-CM

## 2017-06-17 DIAGNOSIS — E559 Vitamin D deficiency, unspecified: Secondary | ICD-10-CM

## 2017-06-17 DIAGNOSIS — Z0000 Encounter for general adult medical examination without abnormal findings: Secondary | ICD-10-CM

## 2017-06-17 DIAGNOSIS — Z4822 Encounter for aftercare following kidney transplant: Principal | ICD-10-CM

## 2017-06-17 DIAGNOSIS — E119 Type 2 diabetes mellitus without complications: Secondary | ICD-10-CM

## 2017-06-17 DIAGNOSIS — K056 Periodontal disease, unspecified: Secondary | ICD-10-CM

## 2017-06-17 MED ORDER — CARVEDILOL 25 MG TABLET
ORAL_TABLET | Freq: Two times a day (BID) | ORAL | 11 refills | 0.00000 days | Status: CP
Start: 2017-06-17 — End: 2018-06-30

## 2017-06-17 MED ORDER — ACETAMINOPHEN 300 MG-CODEINE 30 MG TABLET
ORAL_TABLET | Freq: Four times a day (QID) | ORAL | 0 refills | 0 days | Status: CP | PRN
Start: 2017-06-17 — End: 2017-06-21

## 2017-06-17 MED ORDER — AMOXICILLIN 500 MG TABLET
ORAL_TABLET | Freq: Three times a day (TID) | ORAL | 0 refills | 0.00000 days | Status: CP
Start: 2017-06-17 — End: 2017-06-24

## 2017-06-17 MED ORDER — MYFORTIC 180 MG TABLET,DELAYED RELEASE
ORAL_TABLET | Freq: Two times a day (BID) | ORAL | 11 refills | 0 days | Status: CP
Start: 2017-06-17 — End: 2018-06-30

## 2017-06-17 MED ORDER — INSULIN GLARGINE (U-100) 100 UNIT/ML (3 ML) SUBCUTANEOUS PEN
PEN_INJECTOR | Freq: Every evening | SUBCUTANEOUS | 5 refills | 0 days
Start: 2017-06-17 — End: 2018-04-28

## 2017-06-19 MED ORDER — MAGNESIUM OXIDE 400 MG (241.3 MG MAGNESIUM) TABLET
ORAL_TABLET | Freq: Every day | ORAL | 11 refills | 0 days | Status: CP
Start: 2017-06-19 — End: 2018-06-30

## 2017-07-07 MED ORDER — CLONIDINE HCL 0.3 MG TABLET
ORAL_TABLET | Freq: Every day | ORAL | 11 refills | 0 days | Status: CP
Start: 2017-07-07 — End: 2018-07-07

## 2017-07-08 ENCOUNTER — Ambulatory Visit
Admit: 2017-07-08 | Discharge: 2017-07-09 | Payer: BLUE CROSS/BLUE SHIELD | Attending: Student in an Organized Health Care Education/Training Program | Primary: Student in an Organized Health Care Education/Training Program

## 2017-07-08 DIAGNOSIS — K029 Dental caries, unspecified: Principal | ICD-10-CM

## 2017-07-08 MED ORDER — HYDROCODONE 5 MG-ACETAMINOPHEN 325 MG TABLET
ORAL_TABLET | Freq: Four times a day (QID) | ORAL | 0 refills | 0.00000 days | Status: CP | PRN
Start: 2017-07-08 — End: 2017-07-29

## 2017-07-08 MED ORDER — AMOXICILLIN 500 MG TABLET
ORAL_TABLET | Freq: Three times a day (TID) | ORAL | 0 refills | 0.00000 days | Status: CP
Start: 2017-07-08 — End: 2017-07-15

## 2017-07-29 ENCOUNTER — Encounter
Admit: 2017-07-29 | Discharge: 2017-07-30 | Payer: BLUE CROSS/BLUE SHIELD | Attending: Student in an Organized Health Care Education/Training Program | Primary: Student in an Organized Health Care Education/Training Program

## 2017-07-29 DIAGNOSIS — K056 Periodontal disease, unspecified: Principal | ICD-10-CM

## 2017-07-29 MED ORDER — HYDROCODONE 5 MG-ACETAMINOPHEN 325 MG TABLET
ORAL_TABLET | Freq: Four times a day (QID) | ORAL | 0 refills | 0.00000 days | Status: CP | PRN
Start: 2017-07-29 — End: 2018-02-02

## 2017-07-29 MED ORDER — AMOXICILLIN 500 MG TABLET
ORAL_TABLET | Freq: Three times a day (TID) | ORAL | 0 refills | 0.00000 days | Status: CP
Start: 2017-07-29 — End: 2017-08-03

## 2017-07-29 MED ORDER — HYDROCODONE 5 MG-ACETAMINOPHEN 325 MG TABLET: 1 | tablet | Freq: Four times a day (QID) | 0 refills | 0 days | Status: AC

## 2017-07-29 MED ORDER — AMOXICILLIN 500 MG TABLET: 500 mg | tablet | Freq: Three times a day (TID) | 0 refills | 0 days | Status: AC

## 2017-08-05 ENCOUNTER — Encounter: Payer: Self-pay | Admitting: Dietician

## 2017-08-05 ENCOUNTER — Encounter: Payer: Medicaid Other | Attending: Internal Medicine | Admitting: Dietician

## 2017-08-05 VITALS — BP 140/80 | Ht 72.0 in | Wt 250.3 lb

## 2017-08-05 DIAGNOSIS — Z713 Dietary counseling and surveillance: Secondary | ICD-10-CM | POA: Diagnosis present

## 2017-08-05 DIAGNOSIS — E109 Type 1 diabetes mellitus without complications: Secondary | ICD-10-CM | POA: Insufficient documentation

## 2017-08-05 DIAGNOSIS — Z6833 Body mass index (BMI) 33.0-33.9, adult: Secondary | ICD-10-CM | POA: Insufficient documentation

## 2017-08-05 DIAGNOSIS — E1029 Type 1 diabetes mellitus with other diabetic kidney complication: Secondary | ICD-10-CM

## 2017-08-05 NOTE — Patient Instructions (Signed)
   Keep a record of what and how much (portions) you eat. Estimate the carb grams and divide by 8 to know how much Humalog insulin to take.   Aim for 45-60grams of carbs with each meal, consistently to keep blood sugar steady.   Continue to include a protein food with each meal.  Eat a small meal or snack midday.

## 2017-08-05 NOTE — Progress Notes (Signed)
Diabetes Self-Management Education  Visit Type: Comprehensive  1:1  Appt. Start Time: 1100 Appt. End Time: 1225  08/05/2017  [name], identified by name and date of birth, is a 42 y.o. male with a diagnosis of Diabetes: Type 1.   ASSESSMENT  Blood pressure 140/80, height 6' (1.829 m), weight 250 lb 4.8 oz (113.5 kg). Body mass index is 33.95 kg/m.  Diabetes Self-Management Education - 08/05/17 1126      Visit Information   Visit Type  Comprehensive      Pre-Education Assessment   Patient understands the diabetes disease and treatment process.  Needs Review    Patient understands incorporating nutritional management into lifestyle.  Needs Review    Patient undertands incorporating physical activity into lifestyle.  Needs Review    Patient understands using medications safely.  Needs Review    Patient understands monitoring blood glucose, interpreting and using results  Demonstrates understanding / competency    Patient understands prevention, detection, and treatment of acute complications.  Needs Review    Patient understands prevention, detection, and treatment of chronic complications.  Needs Review    Patient understands how to develop strategies to address psychosocial issues.  Needs Review    Patient understands how to develop strategies to promote health/change behavior.  Needs Review      Complications   Last HgB A1C per patient/outside source  10.9 %    How often do you check your blood sugar?  3-4 times/day    Fasting Blood glucose range (mg/dL)  16-109;604-540;981-191;>47870-129;130-179;180-200;>200    Postprandial Blood glucose range (mg/dL)  29-562;130-865;784-696;>29570-129;130-179;180-200;>200    Have you had a dilated eye exam in the past 12 months?  No    Have you had a dental exam in the past 12 months?  Yes    Are you checking your feet?  Yes    How many days per week are you checking your feet?  7      Dietary Intake   Breakfast  9:30am cereal currently frosted rice krispies due to dentition. can  eat eggs.     Lunch  none    Dinner  5pm chicken, hot dogs, (no pork), hamburger, pizza, spaghetti, loves vegetable greens, potatoes with ketchup    Snack (evening)  9pm glucerna    Beverage(s)  water, sprite zero, diet Mt. Dew      Exercise   Exercise Type  ADL's      Patient Education   Nutrition management   Role of diet in the treatment of diabetes and the relationship between the three main macronutrients and blood glucose level;Food label reading, portion sizes and measuring food.;Carbohydrate counting;Meal timing in regards to the patients' current diabetes medication.;Meal options for control of blood glucose level and chronic complications.    Physical activity and exercise   Role of exercise on diabetes management, blood pressure control and cardiac health.    Psychosocial adjustment  Role of stress on diabetes;Identified and addressed patients feelings and concerns about diabetes    Personal strategies to promote health  Lifestyle issues that need to be addressed for better diabetes care;Helped patient develop diabetes management plan for (enter comment) combining efforts to control diabetes and renal health      Outcomes   Expected Outcomes  Demonstrated interest in learning. Expect positive outcomes    Future DMSE  4-6 wks       Individualized Plan for Diabetes Self-Management Training:   Learning Objective:  Patient will have a greater understanding of  diabetes self-management. Patient education plan is to attend individual and/or group sessions per assessed needs and concerns.  Patient voices confusion over dietary needs/ restriction for diabetes and renal health. He continues to limit potassium and phosphorus in effort to protect his kidney, despite transplant working normally. He has been working on carb counting but reports limited knowledge and comfort level.    Plan:   Patient Instructions   Keep a record of what and how much (portions) you eat. Estimate the carb  grams and divide by 8 to know how much Humalog insulin to take.   Aim for 45-60grams of carbs with each meal, consistently to keep blood sugar steady.   Continue to include a protein food with each meal.  Eat a small meal or snack midday.    Expected Outcomes:  Demonstrated interest in learning. Expect positive outcomes  Education material provided: General Diet Guidelines for Diabetes; Carb Counting Made Easy (AND); Planning A Balanced Meal; Quick and Easy Meal Ideas  If problems or questions, patient to contact team via:  Phone or email  Future DSME appointment: 4-6 wks

## 2017-08-11 MED ORDER — PROGRAF 1 MG CAPSULE
ORAL_CAPSULE | Freq: Two times a day (BID) | ORAL | 5 refills | 0 days | Status: CP
Start: 2017-08-11 — End: 2018-02-02

## 2017-08-18 MED ORDER — HYDRALAZINE 25 MG TABLET
ORAL_TABLET | Freq: Two times a day (BID) | ORAL | 3 refills | 0.00000 days | Status: CP
Start: 2017-08-18 — End: 2017-10-27

## 2017-09-02 ENCOUNTER — Ambulatory Visit: Payer: Medicaid Other | Admitting: Dietician

## 2017-09-23 ENCOUNTER — Ambulatory Visit: Payer: Medicaid Other | Admitting: Dietician

## 2017-10-09 ENCOUNTER — Encounter: Payer: Self-pay | Admitting: Dietician

## 2017-10-09 NOTE — Progress Notes (Signed)
Have not heard back from patient to reschedule his second consecutive missed appointment on 09/23/17. Sent letter to referring provider.

## 2017-10-27 ENCOUNTER — Encounter: Admit: 2017-10-27 | Discharge: 2017-10-27 | Payer: BLUE CROSS/BLUE SHIELD | Attending: Nephrology | Primary: Nephrology

## 2017-10-27 ENCOUNTER — Encounter: Admit: 2017-10-27 | Discharge: 2017-10-28 | Payer: BLUE CROSS/BLUE SHIELD

## 2017-10-27 DIAGNOSIS — Z0000 Encounter for general adult medical examination without abnormal findings: Principal | ICD-10-CM

## 2017-10-27 DIAGNOSIS — Z940 Kidney transplant status: Secondary | ICD-10-CM

## 2017-10-27 DIAGNOSIS — E559 Vitamin D deficiency, unspecified: Principal | ICD-10-CM

## 2017-10-30 MED ORDER — CHOLECALCIFEROL (VITAMIN D3) 1,250 MCG (50,000 UNIT) CAPSULE
ORAL_CAPSULE | ORAL | 11 refills | 0.00000 days | Status: CP
Start: 2017-10-30 — End: 2024-10-22

## 2017-11-10 ENCOUNTER — Encounter
Admit: 2017-11-10 | Discharge: 2017-11-11 | Payer: BLUE CROSS/BLUE SHIELD | Attending: General Practice | Primary: General Practice

## 2017-11-10 DIAGNOSIS — K08409 Partial loss of teeth, unspecified cause, unspecified class: Secondary | ICD-10-CM

## 2017-11-10 DIAGNOSIS — K0510 Chronic gingivitis, plaque induced: Principal | ICD-10-CM

## 2017-12-02 MED ORDER — AMLODIPINE 10 MG TABLET
ORAL_TABLET | Freq: Every day | ORAL | 3 refills | 0 days | Status: CP
Start: 2017-12-02 — End: 2018-12-02

## 2017-12-31 MED ORDER — INSULIN SYRINGE U-100 WITH NEEDLE 0.5 ML 29 GAUGE X 1/2" (12 MM)
5 refills | 0 days | Status: CP
Start: 2017-12-31 — End: ?

## 2018-02-02 ENCOUNTER — Encounter: Admit: 2018-02-02 | Discharge: 2018-02-02 | Payer: BLUE CROSS/BLUE SHIELD

## 2018-02-02 ENCOUNTER — Encounter: Admit: 2018-02-02 | Discharge: 2018-02-02 | Payer: BLUE CROSS/BLUE SHIELD | Attending: Nephrology | Primary: Nephrology

## 2018-02-02 DIAGNOSIS — Z940 Kidney transplant status: Principal | ICD-10-CM

## 2018-02-02 DIAGNOSIS — D899 Disorder involving the immune mechanism, unspecified: Secondary | ICD-10-CM

## 2018-02-02 DIAGNOSIS — E559 Vitamin D deficiency, unspecified: Principal | ICD-10-CM

## 2018-02-02 MED ORDER — PROGRAF 1 MG CAPSULE: 4 mg | capsule | Freq: Two times a day (BID) | 5 refills | 0 days | Status: AC

## 2018-02-02 MED ORDER — PROGRAF 1 MG CAPSULE
ORAL_CAPSULE | Freq: Two times a day (BID) | ORAL | 5 refills | 0.00000 days | Status: CP
Start: 2018-02-02 — End: 2018-10-25

## 2018-04-28 ENCOUNTER — Encounter: Admit: 2018-04-28 | Discharge: 2018-04-28 | Payer: BLUE CROSS/BLUE SHIELD | Attending: Nephrology | Primary: Nephrology

## 2018-04-28 ENCOUNTER — Encounter: Admit: 2018-04-28 | Discharge: 2018-04-28 | Payer: BLUE CROSS/BLUE SHIELD

## 2018-04-28 DIAGNOSIS — Z940 Kidney transplant status: Principal | ICD-10-CM

## 2018-04-28 DIAGNOSIS — E119 Type 2 diabetes mellitus without complications: Principal | ICD-10-CM

## 2018-04-28 DIAGNOSIS — D899 Disorder involving the immune mechanism, unspecified: Principal | ICD-10-CM

## 2018-04-28 DIAGNOSIS — E559 Vitamin D deficiency, unspecified: Principal | ICD-10-CM

## 2018-04-28 DIAGNOSIS — I10 Essential (primary) hypertension: Principal | ICD-10-CM

## 2018-04-28 DIAGNOSIS — N186 End stage renal disease: Principal | ICD-10-CM

## 2018-04-28 DIAGNOSIS — I639 Cerebral infarction, unspecified: Principal | ICD-10-CM

## 2018-06-04 MED ORDER — ATORVASTATIN 40 MG TABLET
ORAL_TABLET | Freq: Every evening | ORAL | 11 refills | 0 days | Status: CP
Start: 2018-06-04 — End: 2019-06-04

## 2018-06-30 MED ORDER — MYFORTIC 180 MG TABLET,DELAYED RELEASE
ORAL_TABLET | Freq: Two times a day (BID) | ORAL | 11 refills | 0 days | Status: CP
Start: 2018-06-30 — End: 2019-06-30

## 2018-06-30 MED ORDER — CARVEDILOL 25 MG TABLET
ORAL_TABLET | Freq: Two times a day (BID) | ORAL | 11 refills | 0 days | Status: CP
Start: 2018-06-30 — End: 2019-06-30

## 2018-06-30 MED ORDER — MAGNESIUM OXIDE 400 MG (241.3 MG MAGNESIUM) TABLET
ORAL_TABLET | Freq: Every day | ORAL | 11 refills | 0.00000 days | Status: CP
Start: 2018-06-30 — End: 2019-06-30

## 2018-07-30 ENCOUNTER — Encounter: Admit: 2018-07-30 | Discharge: 2018-07-31 | Payer: BLUE CROSS/BLUE SHIELD | Attending: Nephrology | Primary: Nephrology

## 2018-10-25 MED ORDER — PROGRAF 1 MG CAPSULE
ORAL_CAPSULE | Freq: Two times a day (BID) | ORAL | 5 refills | 30.00000 days | Status: CP
Start: 2018-10-25 — End: 2019-04-23

## 2018-11-12 ENCOUNTER — Telehealth: Payer: Self-pay | Admitting: Urology

## 2018-11-12 ENCOUNTER — Ambulatory Visit (INDEPENDENT_AMBULATORY_CARE_PROVIDER_SITE_OTHER): Payer: Medicaid Other | Admitting: Urology

## 2018-11-12 ENCOUNTER — Other Ambulatory Visit: Payer: Self-pay

## 2018-11-12 ENCOUNTER — Encounter: Payer: Self-pay | Admitting: Urology

## 2018-11-12 VITALS — BP 143/86 | HR 79 | Ht 72.0 in | Wt 283.2 lb

## 2018-11-12 DIAGNOSIS — N5201 Erectile dysfunction due to arterial insufficiency: Secondary | ICD-10-CM

## 2018-11-12 NOTE — Telephone Encounter (Signed)
Pt called inquiring on when his Tadalafil  will be called in. He has been to the pharmacy twice and he states that they do not have it.

## 2018-11-12 NOTE — Progress Notes (Signed)
11/12/2018 11:29 AM   [name] 11-13-1975 [id number]  Referring provider: Bronson Curb, PA-C [address] [place],  [place] [address]  Chief Complaint  Patient presents with  . Erectile Dysfunction    HPI: [name] is a 43 y.o. male referred for evaluation IV erectile dysfunction.  He states his problems started in 2014 when he was diagnosed with acute renal failure.  He had a CVA and 2015.  He was on dialysis for approximately 3 years and underwent renal transplant at [facility] in 2017.  He presently complains of difficulty achieving and maintaining an erection.  His erections are firm enough for penetration much less than 50% of the time.  He is almost never able to maintain the erection when he achieves penetration.  SHIM was 9/25 indicating moderate ED.  He tried a 20 mg generic sildenafil tablet which he states was not effective.  He has significant organic risk factors including longstanding type 1 diabetes, hypertension, prior tobacco use and antihypertensive medications including amlodipine, Coreg, hydralazine and clonidine.  Denies pain or curvature with erections.  Denies the use of oral or sublingual nitrates.   PMH: Past Medical History:  Diagnosis Date  . Chronic kidney disease   . GERD (gastroesophageal reflux disease)   . Hypertension   . Type 1 diabetes [facility])     Surgical History: Past Surgical History:  Procedure Laterality Date  . AV FISTULA PLACEMENT Left 07/28/2014   Procedure: ARTERIOVENOUS (AV) FISTULA CREATION;  Surgeon: Katha Cabal, MD;  Location: [facility] ORS;  Service: Vascular;  Laterality: Left;  . ESOPHAGOGASTRODUODENOSCOPY N/A 04/08/2013   Procedure: ESOPHAGOGASTRODUODENOSCOPY (EGD);  Surgeon: Danie Binder, MD;  Location[facility];  Service: Endoscopy;  Laterality: N/A;  8:30  . INSERTION OF DIALYSIS CATHETER  11/2012  . PERIPHERAL VASCULAR CATHETERIZATION N/A 10/23/2014   Procedure: Dialysis/Perma Catheter Removal;   Surgeon: Algernon Huxley, MD;  Location: [facility];  Service: Cardiovascular;  Laterality: N/A;    Home Medications:  Allergies as of 11/12/2018   No Known Allergies     Medication List       Accurate as of November 12, 2018 11:29 AM. If you have any questions, ask your nurse or doctor.        acetaminophen 325 MG tablet Commonly known as: TYLENOL Take by mouth.   amLODipine 10 MG tablet Commonly known as: NORVASC Take 10 mg by mouth 2 (two) times daily.   aspirin EC 81 MG tablet Take by mouth.   atorvastatin 40 MG tablet Commonly known as: LIPITOR   BD Insulin Syringe U/F 31G X 5/16" 0.5 ML Misc Generic drug: Insulin Syringe-Needle U-100   calcium acetate 667 MG capsule Commonly known as: PHOSLO Take 2,001 mg by mouth 3 (three) times daily with meals.   carvedilol 25 MG tablet Commonly known as: COREG Take by mouth.   citalopram 40 MG tablet Commonly known as: CELEXA Take 40 mg by mouth 2 (two) times daily.   cloNIDine 0.3 MG tablet Commonly known as: CATAPRES Take 0.3 mg by mouth 2 (two) times daily.   HumaLOG 100 UNIT/ML injection Generic drug: insulin lispro   hydrALAZINE 50 MG tablet Commonly known as: APRESOLINE Take 50 mg by mouth 2 (two) times daily.   HYDROcodone-acetaminophen 5-325 MG tablet Commonly known as: NORCO/VICODIN   Lantus 100 UNIT/ML injection Generic drug: insulin glargine Inject into the skin.   Magnesium Oxide 400 (240 Mg) MG Tabs   Myfortic 180  MG EC tablet Generic drug: mycophenolate   omeprazole 20 MG capsule Commonly known as: PRILOSEC 1 po 30 min sprior to meals bid for 3 mos then daily   ondansetron 4 MG disintegrating tablet Commonly known as: Zofran ODT 1 SL Q6H PRN NAUSEA OR VOMITING   OneTouch Ping Insulin Pump Kit by Does not apply route.   pantoprazole 40 MG tablet Commonly known as: PROTONIX Take 40 mg by mouth 2 (two) times daily.   Precision QID Test test strip Generic drug: glucose blood  Test Blood Sugar four times a day; E11.9   Prograf 1 MG capsule Generic drug: tacrolimus Take by mouth.   sildenafil 100 MG tablet Commonly known as: VIAGRA   vitamin B-6 25 MG tablet Commonly known as: pyridOXINE   Vitamin D (Ergocalciferol) 1.25 MG (50000 UT) Caps capsule Commonly known as: DRISDOL       Allergies: No Known Allergies  Family History: Family History  Problem Relation Age of Onset  . Colon cancer Neg Hx     Social History:  reports that he has quit smoking. His smoking use included cigarettes. He smoked 0.50 packs per day. He quit smokeless tobacco use about 5 years ago. He reports that he does not drink alcohol or use drugs.  ROS: UROLOGY Frequent Urination?: No Hard to postpone urination?: No Burning/pain with urination?: No Get up at night to urinate?: No Leakage of urine?: No Urine stream starts and stops?: No Trouble starting stream?: No Do you have to strain to urinate?: No Blood in urine?: No Urinary tract infection?: No Sexually transmitted disease?: No Injury to kidneys or bladder?: No Painful intercourse?: No Weak stream?: No Erection problems?: Yes Penile pain?: No  Gastrointestinal Nausea?: No Vomiting?: No Indigestion/heartburn?: No Diarrhea?: No Constipation?: No  Constitutional Fever: No Night sweats?: No Weight loss?: No Fatigue?: No  Skin Skin rash/lesions?: No Itching?: No  Eyes Blurred vision?: No Double vision?: No  Ears/Nose/Throat Sore throat?: No Sinus problems?: No  Hematologic/Lymphatic Swollen glands?: No Easy bruising?: No  Cardiovascular Leg swelling?: No Chest pain?: No  Respiratory Cough?: No Shortness of breath?: No  Endocrine Excessive thirst?: No  Musculoskeletal Back pain?: No Joint pain?: No  Neurological Headaches?: No Dizziness?: No  Psychologic Depression?: No Anxiety?: No  Physical Exam: BP (!) 143/86 (BP Location: Left Arm, Patient Position: Sitting, Cuff Size:  Large)   Pulse 79   Ht 6' (1.829 m)   Wt 283 lb 3.2 oz (128.5 kg)   BMI 38.41 kg/m   Constitutional:  Alert and oriented, No acute distress. HEENT: [place] AT, moist mucus membranes.  Trachea midline, no masses. Cardiovascular: No clubbing, cyanosis, or edema. Respiratory: Normal respiratory effort, no increased work of breathing. GI: Abdomen is soft, nontender, nondistended, no abdominal masses GU: Phallus circumcised without lesions, testes descended bilaterally with estimated volume 20 cc bilaterally.  Spermatic cord/epididymis palpably normal bilaterally. Skin: No rashes, bruises or suspicious lesions. Neurologic: Grossly intact, no focal deficits, moving all 4 extremities. Psychiatric: Normal mood and affect.   Assessment & Plan:    - Erectile dysfunction Significant organic risk factors.  He does have partial erections.  It does not appear he had an adequate PDE 5 inhibitor trial.  Rx tadalafil 20 mg was sent to pharmacy.  He will call back regarding efficacy.  Intracavernosal injections and vacuum erection devices were also discussed.  He was given literature.  If PDE 5 inhibitor trial is not successful he will call back regarding alternative preferences.   Scott  Mariana Arn, MD  [facility] [address], [place], [place] [address] [phone]

## 2018-11-14 ENCOUNTER — Encounter: Payer: Self-pay | Admitting: Urology

## 2018-11-14 MED ORDER — TADALAFIL 20 MG PO TABS: ORAL_TABLET | ORAL | 0 refills | Status: DC

## 2018-11-15 ENCOUNTER — Telehealth: Payer: Self-pay | Admitting: Urology

## 2018-11-15 NOTE — Telephone Encounter (Signed)
Pt called back and states that his Rx is at pharmacy, disregard previous note.

## 2018-11-15 NOTE — Telephone Encounter (Signed)
Pt is calling for Tadalafil Rx.

## 2018-11-25 ENCOUNTER — Encounter: Admit: 2018-11-25 | Discharge: 2018-11-25 | Payer: BLUE CROSS/BLUE SHIELD | Attending: Nephrology | Primary: Nephrology

## 2018-11-25 ENCOUNTER — Encounter: Admit: 2018-11-25 | Discharge: 2018-11-25 | Payer: BLUE CROSS/BLUE SHIELD

## 2018-11-25 DIAGNOSIS — E559 Vitamin D deficiency, unspecified: Secondary | ICD-10-CM

## 2018-11-25 DIAGNOSIS — Z79899 Other long term (current) drug therapy: Secondary | ICD-10-CM

## 2018-11-25 DIAGNOSIS — Z7982 Long term (current) use of aspirin: Secondary | ICD-10-CM

## 2018-11-25 DIAGNOSIS — N529 Male erectile dysfunction, unspecified: Secondary | ICD-10-CM

## 2018-11-25 DIAGNOSIS — Z9641 Presence of insulin pump (external) (internal): Secondary | ICD-10-CM

## 2018-11-25 DIAGNOSIS — I10 Essential (primary) hypertension: Secondary | ICD-10-CM

## 2018-11-25 DIAGNOSIS — D649 Anemia, unspecified: Secondary | ICD-10-CM

## 2018-11-25 DIAGNOSIS — D72819 Decreased white blood cell count, unspecified: Secondary | ICD-10-CM

## 2018-11-25 DIAGNOSIS — E119 Type 2 diabetes mellitus without complications: Secondary | ICD-10-CM

## 2018-11-25 DIAGNOSIS — Z23 Encounter for immunization: Secondary | ICD-10-CM

## 2018-11-25 DIAGNOSIS — Z940 Kidney transplant status: Secondary | ICD-10-CM

## 2018-11-25 DIAGNOSIS — Z8673 Personal history of transient ischemic attack (TIA), and cerebral infarction without residual deficits: Secondary | ICD-10-CM

## 2018-11-25 DIAGNOSIS — Z0000 Encounter for general adult medical examination without abnormal findings: Secondary | ICD-10-CM

## 2018-11-25 MED ORDER — CHOLECALCIFEROL (VITAMIN D3) 1,250 MCG (50,000 UNIT) CAPSULE
ORAL_CAPSULE | ORAL | 11 refills | 0 days | Status: CP
Start: 2018-11-25 — End: 2025-11-17

## 2018-11-25 MED ORDER — AMLODIPINE 10 MG TABLET
ORAL_TABLET | Freq: Every day | ORAL | 3 refills | 90.00000 days | Status: CP
Start: 2018-11-25 — End: 2019-11-25

## 2019-01-04 MED ORDER — INSULIN SYRINGE U-100 WITH NEEDLE 0.5 ML 29 GAUGE X 1/2" (12 MM)
5 refills | 0 days | Status: CP
Start: 2019-01-04 — End: ?

## 2019-04-11 DIAGNOSIS — Z940 Kidney transplant status: Principal | ICD-10-CM

## 2019-04-11 MED ORDER — PROGRAF 1 MG CAPSULE
ORAL_CAPSULE | Freq: Two times a day (BID) | ORAL | 5 refills | 30 days | Status: CP
Start: 2019-04-11 — End: 2019-10-08

## 2019-06-02 MED ORDER — ATORVASTATIN 40 MG TABLET
ORAL_TABLET | Freq: Every evening | ORAL | 11 refills | 30 days | Status: CP
Start: 2019-06-02 — End: 2020-06-01

## 2019-06-14 MED ORDER — MYFORTIC 180 MG TABLET,DELAYED RELEASE
ORAL_TABLET | Freq: Two times a day (BID) | ORAL | 11 refills | 30 days | Status: CP
Start: 2019-06-14 — End: 2020-06-13

## 2019-07-06 DIAGNOSIS — Z940 Kidney transplant status: Principal | ICD-10-CM

## 2019-07-06 MED ORDER — MAGNESIUM OXIDE 400 MG (241.3 MG MAGNESIUM) TABLET
ORAL_TABLET | Freq: Every day | ORAL | 11 refills | 30 days | Status: CP
Start: 2019-07-06 — End: 2020-07-05

## 2019-07-06 MED ORDER — MYFORTIC 180 MG TABLET,DELAYED RELEASE
ORAL_TABLET | Freq: Two times a day (BID) | ORAL | 11 refills | 30.00000 days | Status: CP
Start: 2019-07-06 — End: 2020-07-05

## 2019-07-06 MED ORDER — CARVEDILOL 25 MG TABLET
ORAL_TABLET | Freq: Two times a day (BID) | ORAL | 11 refills | 30 days | Status: CP
Start: 2019-07-06 — End: 2020-07-05

## 2019-09-25 DIAGNOSIS — Z940 Kidney transplant status: Principal | ICD-10-CM

## 2019-11-09 ENCOUNTER — Encounter: Payer: Self-pay | Admitting: Dietician

## 2019-11-09 ENCOUNTER — Encounter: Payer: Medicaid Other | Attending: Internal Medicine | Admitting: Dietician

## 2019-11-09 ENCOUNTER — Other Ambulatory Visit: Payer: Self-pay

## 2019-11-09 VITALS — Ht 72.0 in | Wt 300.7 lb

## 2019-11-09 DIAGNOSIS — E109 Type 1 diabetes mellitus without complications: Secondary | ICD-10-CM | POA: Diagnosis not present

## 2019-11-09 DIAGNOSIS — I10 Essential (primary) hypertension: Secondary | ICD-10-CM | POA: Insufficient documentation

## 2019-11-09 DIAGNOSIS — E669 Obesity, unspecified: Secondary | ICD-10-CM | POA: Insufficient documentation

## 2019-11-09 DIAGNOSIS — E10319 Type 1 diabetes mellitus with unspecified diabetic retinopathy without macular edema: Secondary | ICD-10-CM

## 2019-11-09 DIAGNOSIS — Z6841 Body Mass Index (BMI) 40.0 and over, adult: Secondary | ICD-10-CM | POA: Insufficient documentation

## 2019-11-09 NOTE — Patient Instructions (Signed)
   Increase daily movement, increase exercise. Consider setting alarms to get up and move throughout the day, and/or find enjoyable activities that are easy to want to do. Start with a few minutes and build up time gradually.  Keep a treatment for low blood sugar on hand always, but especially when away from home. Glucose tabs or soft candy that is quick to dissolve (smarites, pillow-peppermints), or drink 4oz of regular soda or lemonade or fruit juice. Then follow with a meal or snack that has carb and protein.   Stay as consistent as possible with the amount of carbs eaten at each meal/ snack.[name] aim for 45-60grams.   Consider some healthy "low glycemic" carb foods like sprouted grain bread (Dave's) or old-fashioned sourdough.   Use food labels to check carb content, or check calorieking.com, when you don't have your reference book. Count carbs when you have a large portion (1/4 cup) of ketchup or barbecue sauce or other sweet sauce.   Use sugar free lemonade to sweeten tea.

## 2019-11-09 NOTE — Progress Notes (Signed)
Medical Nutrition Therapy: Visit start time: 1345  end time: 1500  Assessment:  Diagnosis: Type 1 Diabetes Past medical history: renal transplant 2017, HTN Psychosocial issues/ stress concerns: none  Preferred learning method:  . Auditory . Visual   Current weight: 300.7lbs  Height: 6'0" Medications, supplements: reconciled list in medical record  Progress and evaluation:   Patient reports he was about 205lbs after transplant in 2017 and hospitalization 2018, regained some weight in 2019 (was 250lbs) and more since pandemic.   He attended diabetes education for one visit in 2019, RD visit.   Recent HbA1C was 6.6% on 10/04/19.  He reports transplanted kidney continues to function well.   He is mostly sedentary during the day. Does not have good place to walk near his home, nearest gym is at least 15 minute drive.  He states his BG will often increase significantly after eating, so he then will bolus extra insulin and ends up with low BG. Or eats sweets to treat low BG and ends up with a high BG.      Physical activity: walking, [employer] 60 minutes + 1-2 times a week   Dietary Intake:  Usual eating pattern includes 2 meals and 1-2 snacks per day. Dining out frequency: 3-5 meals per week.  Breakfast: 9-10am -- cereal (frosted flakes or frosted rice krispies), or sandwich on whole wheat bread with bologna, [place] or ham, or [place] bacon; occasionally eggs or pancakes Snack: sweet snack if sugar drops -- today ate oatmeal cookie Lunch: usually none. Eats sweet snacks  Snack: same as am Supper: 5-6pm -- steak, chicken/ fish air fryer, baked + [place] fries/ baked potato + salad/ greens/ green beans Snack: none or same as am Beverages: water, sprite zero, Mt Dew zero, occasionally unsweetened tea with lemonade powder (regular Country Time) 2-3 times a week  Nutrition Care Education: Topics covered:  Basic nutrition: basic food groups, appropriate nutrient balance,  appropriate meal and snack schedule, general nutrition guidelines    Weight control: importance of low sugar and low fat choices, portion control,  estimated energy needs for weight loss at 1800kcal, provided guidance for 45% CHO, 25% protein Advanced nutrition:  food label reading for carb content; carb counting Diabetes: appropriate meal and snack schedule, appropriate carb intake and balance, healthy carb choices, role of fiber, protein, fat; role of exercise/ activity; reviewed appropriate treatment for low BGs  Hypertension:  Benefits of eating fruits and vegetables often; limiting fast foods; role of exercise/ activity   Nutritional Diagnosis:  [place]-2.2 Altered nutrition-related laboratory As related to Type 1 diabetes.  As evidenced by recent HbA1C of 6.6%. [place]-3.3 Overweight/obesity As related to excess calories and inactivity.  As evidenced by patient wtih current BMI of 40.6.  Intervention:   Instruction and discussion as noted above.  Patient has maintained/ resumed some healthy habits from previous MNT visit 07/2017.  Patient voices motivation to continue improving his diet and lifestyle.   Established goals with direction from patient.  Education Materials given:  . Plate Planner with food lists, sample meal pattern . Sample menus . Snacking handout . Goals/ instructions   Learner/ who was taught:  . Patient   Level of understanding: [name] Verbalizes/ demonstrates competency   Demonstrated degree of understanding via:   Teach back Learning barriers: . None  Willingness to learn/ readiness for change: . Eager, change in progress   Monitoring and Evaluation:  Dietary intake, exercise, BG control, and body weight      follow up:  11/30/19 at 1:30pm

## 2019-11-23 DIAGNOSIS — Z940 Kidney transplant status: Principal | ICD-10-CM

## 2019-11-23 MED ORDER — PROGRAF 1 MG CAPSULE
ORAL_CAPSULE | Freq: Two times a day (BID) | ORAL | 5 refills | 30.00000 days | Status: CP
Start: 2019-11-23 — End: 2020-05-21

## 2019-11-30 ENCOUNTER — Encounter: Payer: Medicaid Other | Attending: Internal Medicine | Admitting: Dietician

## 2019-11-30 ENCOUNTER — Encounter: Payer: Self-pay | Admitting: Dietician

## 2019-11-30 ENCOUNTER — Other Ambulatory Visit: Payer: Self-pay

## 2019-11-30 VITALS — Ht 72.0 in | Wt 290.0 lb

## 2019-11-30 DIAGNOSIS — Z6839 Body mass index (BMI) 39.0-39.9, adult: Secondary | ICD-10-CM | POA: Diagnosis present

## 2019-11-30 DIAGNOSIS — E10319 Type 1 diabetes mellitus with unspecified diabetic retinopathy without macular edema: Secondary | ICD-10-CM | POA: Diagnosis present

## 2019-11-30 NOTE — Patient Instructions (Signed)
   Awesome job making healthy changes and increasing physical activity! Keep up the great work!  Add some "light" or "2%" cheese with fruit midday/ lunchtime, or a small handful of nuts for a better nutritional balance, and to help prevent low blood sugar during the day.

## 2019-11-30 NOTE — Progress Notes (Signed)
Medical Nutrition Therapy: Visit start time: 1330  end time: 1400  Assessment:  Diagnosis: Type 1 DM Medical history changes: no changes Psychosocial issues/ stress concerns: none  Current weight: 290.0lbs Height: 6'0" Medications, supplement changes: no changes per patient  Progress and evaluation:  . Weight loss of 10lbs since previous visit 3 weeks ago, 11/09/19. [name] Patient reports eating smaller portions . He has switched to whole grain bread, increased frequency of fruit intake (no added sugar) . Waiting on dexcom supplies to be shipped, has had to check with meter today and reports BG of 145 before lunch today.  . Reports some higher readings during the night about 3am.    Physical activity: increased walking, moving more after meals 10-15 minutes  Dietary Intake:  Usual eating pattern includes 2 meals and 2-3 snacks per day. Dining out frequency: 3-5 meals per week.  Breakfast: stopped cereal due to high sugar content; usually sandwich with [place] bologna or salami + 10-12 grapes or fruit cup Snack: grapes/ fruit Lunch: grapes Snack: none Supper: chicken/ fish/ steak/ hot dogs reduced meat portions + veg + controlled starch portions; chick fila sandwich no fries or other fast food sandwich Snack: occasionally fruit recently grapes Beverages: water, sugar free soda  Nutrition Care Education: Topics covered:  Basic nutrition: appropriate nutrient balance Weight control: reviewed progress since previous visit Diabetes:  role of protein, fat in stabilizing BGs and reducing risk for hypoglycemia and controlling hunger   Nutritional Diagnosis:  [place]-2.2 Altered nutrition-related laboratory As related to Type 1 Diabetes.  As evidenced by recent HbA1C of 6.6%. [place]-3.3 Overweight/obesity As related to history of excess calories and inactivity.  As evidenced by patient with current BMI of 39.4, following kcal- and carb-controlled diet to promote ongoing weight loss.  Intervention:   . Instruction and discussion as noted above. . Commended patient for positive changes made and success with weight loss.  . Encouraged addition of a protein source midday to help prevent hypoglycemia and control hunger.  [name] Next follow-up scheduled for January per patient request.   Education Materials given:  [name] Goals/ instructions   Learner/ who was taught:  . Patient   Level of understanding: [name] Verbalizes/ demonstrates competency  Demonstrated degree of understanding via:   Teach back Learning barriers: . None  Willingness to learn/ readiness for change: . Eager, change in progress  Monitoring and Evaluation:  Dietary intake, exercise, BG control, and body weight      follow up: 02/29/20 at 1:30pm

## 2019-12-09 MED ORDER — AMLODIPINE 10 MG TABLET
ORAL_TABLET | Freq: Every day | ORAL | 3 refills | 90 days | Status: CP
Start: 2019-12-09 — End: 2020-12-08

## 2020-01-03 DIAGNOSIS — E559 Vitamin D deficiency, unspecified: Principal | ICD-10-CM

## 2020-01-03 DIAGNOSIS — Z940 Kidney transplant status: Principal | ICD-10-CM

## 2020-01-03 DIAGNOSIS — Z114 Encounter for screening for human immunodeficiency virus [HIV]: Principal | ICD-10-CM

## 2020-01-03 DIAGNOSIS — E119 Type 2 diabetes mellitus without complications: Principal | ICD-10-CM

## 2020-01-03 DIAGNOSIS — Z79899 Other long term (current) drug therapy: Principal | ICD-10-CM

## 2020-01-04 ENCOUNTER — Ambulatory Visit: Admit: 2020-01-04 | Discharge: 2020-01-04 | Payer: BLUE CROSS/BLUE SHIELD

## 2020-01-04 DIAGNOSIS — Z79899 Other long term (current) drug therapy: Principal | ICD-10-CM

## 2020-01-04 DIAGNOSIS — Z4823 Encounter for aftercare following liver transplant: Principal | ICD-10-CM

## 2020-01-04 DIAGNOSIS — Z7982 Long term (current) use of aspirin: Principal | ICD-10-CM

## 2020-01-04 DIAGNOSIS — I120 Hypertensive chronic kidney disease with stage 5 chronic kidney disease or end stage renal disease: Principal | ICD-10-CM

## 2020-01-04 DIAGNOSIS — Z940 Kidney transplant status: Principal | ICD-10-CM

## 2020-01-04 DIAGNOSIS — E119 Type 2 diabetes mellitus without complications: Principal | ICD-10-CM

## 2020-01-04 DIAGNOSIS — Z8673 Personal history of transient ischemic attack (TIA), and cerebral infarction without residual deficits: Principal | ICD-10-CM

## 2020-01-04 DIAGNOSIS — D631 Anemia in chronic kidney disease: Principal | ICD-10-CM

## 2020-01-04 DIAGNOSIS — N186 End stage renal disease: Principal | ICD-10-CM

## 2020-01-04 DIAGNOSIS — D849 Immunodeficiency, unspecified: Principal | ICD-10-CM

## 2020-01-04 DIAGNOSIS — N521 Erectile dysfunction due to diseases classified elsewhere: Principal | ICD-10-CM

## 2020-01-04 DIAGNOSIS — E1069 Type 1 diabetes mellitus with other specified complication: Principal | ICD-10-CM

## 2020-01-04 DIAGNOSIS — Z794 Long term (current) use of insulin: Principal | ICD-10-CM

## 2020-01-04 DIAGNOSIS — Z114 Encounter for screening for human immunodeficiency virus [HIV]: Principal | ICD-10-CM

## 2020-01-04 DIAGNOSIS — E559 Vitamin D deficiency, unspecified: Principal | ICD-10-CM

## 2020-01-04 DIAGNOSIS — I7090 Unspecified atherosclerosis: Principal | ICD-10-CM

## 2020-01-04 DIAGNOSIS — E1022 Type 1 diabetes mellitus with diabetic chronic kidney disease: Principal | ICD-10-CM

## 2020-02-09 MED ORDER — INSULIN SYRINGE U-100 WITH NEEDLE 0.5 ML 29 GAUGE X 1/2" (12 MM)
5 refills | 0.00000 days | Status: CP
Start: 2020-02-09 — End: ?

## 2020-02-29 ENCOUNTER — Ambulatory Visit: Payer: Medicaid Other | Admitting: Dietician

## 2020-03-29 ENCOUNTER — Ambulatory Visit: Payer: Medicaid Other | Admitting: Dietician

## 2020-04-27 ENCOUNTER — Encounter: Payer: Self-pay | Admitting: Dietician

## 2020-04-27 NOTE — Progress Notes (Signed)
Have not heard back from patient to reschedule his missed appointment from 03/29/20. Sent notification to referring provider.

## 2020-05-24 DIAGNOSIS — Z940 Kidney transplant status: Principal | ICD-10-CM

## 2020-05-24 MED ORDER — PROGRAF 1 MG CAPSULE
ORAL_CAPSULE | Freq: Two times a day (BID) | ORAL | 5 refills | 30 days | Status: CP
Start: 2020-05-24 — End: 2020-11-20

## 2020-06-07 MED ORDER — ATORVASTATIN 40 MG TABLET
ORAL_TABLET | Freq: Every evening | ORAL | 11 refills | 30.00000 days | Status: CP
Start: 2020-06-07 — End: 2021-06-07

## 2020-06-27 ENCOUNTER — Ambulatory Visit: Admit: 2020-06-27 | Discharge: 2020-06-27 | Payer: BLUE CROSS/BLUE SHIELD

## 2020-06-27 ENCOUNTER — Ambulatory Visit: Admit: 2020-06-27 | Discharge: 2020-06-27 | Payer: BLUE CROSS/BLUE SHIELD | Attending: Nephrology | Primary: Nephrology

## 2020-06-27 DIAGNOSIS — E119 Type 2 diabetes mellitus without complications: Principal | ICD-10-CM

## 2020-06-27 DIAGNOSIS — Z114 Encounter for screening for human immunodeficiency virus [HIV]: Principal | ICD-10-CM

## 2020-06-27 DIAGNOSIS — Z940 Kidney transplant status: Principal | ICD-10-CM

## 2020-06-27 DIAGNOSIS — E559 Vitamin D deficiency, unspecified: Principal | ICD-10-CM

## 2020-06-27 LAB — PHOSPHORUS: PHOSPHORUS: 2.8 mg/dL (ref 2.4–5.1)

## 2020-06-27 LAB — LIPID PANEL
CHOLESTEROL/HDL RATIO SCREEN: 3.3 (ref 1.0–4.5)
CHOLESTEROL: 128 mg/dL (ref <=200)
HDL CHOLESTEROL: 39 mg/dL — ABNORMAL LOW (ref 40–60)
LDL CHOLESTEROL CALCULATED: 68 mg/dL (ref 40–99)
NON-HDL CHOLESTEROL: 89 mg/dL (ref 70–130)
TRIGLYCERIDES: 103 mg/dL (ref 0–150)
VLDL CHOLESTEROL CAL: 20.6 mg/dL (ref 11–50)

## 2020-06-27 LAB — URINALYSIS
BILIRUBIN UA: NEGATIVE
BLOOD UA: NEGATIVE
GLUCOSE UA: NEGATIVE
HYALINE CASTS: 1 /LPF (ref 0–1)
KETONES UA: NEGATIVE
LEUKOCYTE ESTERASE UA: NEGATIVE
NITRITE UA: NEGATIVE
PH UA: 6 (ref 5.0–9.0)
PROTEIN UA: NEGATIVE
RBC UA: <1 /HPF (ref <3)
SPECIFIC GRAVITY UA: 1.015 (ref 1.005–1.030)
SQUAMOUS EPITHELIAL: <1 /HPF (ref 0–5)
UROBILINOGEN UA: 0.2
WBC UA: <1 /HPF (ref <2)

## 2020-06-27 LAB — HEMOGLOBIN A1C
ESTIMATED AVERAGE GLUCOSE: 154 mg/dL
HEMOGLOBIN A1C: 7 % — ABNORMAL HIGH (ref 4.8–5.6)

## 2020-06-27 LAB — CBC W/ AUTO DIFF
BASOPHILS ABSOLUTE COUNT: 0 10*9/L (ref 0.0–0.1)
BASOPHILS RELATIVE PERCENT: 0.5 %
EOSINOPHILS ABSOLUTE COUNT: 0.1 10*9/L (ref 0.0–0.5)
EOSINOPHILS RELATIVE PERCENT: 3.1 %
HEMATOCRIT: 40.9 % (ref 39.0–48.0)
HEMOGLOBIN: 14 g/dL (ref 12.9–16.5)
LYMPHOCYTES ABSOLUTE COUNT: 1.5 10*9/L (ref 1.1–3.6)
LYMPHOCYTES RELATIVE PERCENT: 42.2 %
MEAN CORPUSCULAR HEMOGLOBIN CONC: 34.2 g/dL (ref 32.0–36.0)
MEAN CORPUSCULAR HEMOGLOBIN: 28 pg (ref 25.9–32.4)
MEAN CORPUSCULAR VOLUME: 82.1 fL (ref 77.6–95.7)
MEAN PLATELET VOLUME: 8.7 fL (ref 6.8–10.7)
MONOCYTES ABSOLUTE COUNT: 0.3 10*9/L (ref 0.3–0.8)
MONOCYTES RELATIVE PERCENT: 9.6 %
NEUTROPHILS ABSOLUTE COUNT: 1.6 10*9/L — ABNORMAL LOW (ref 1.8–7.8)
NEUTROPHILS RELATIVE PERCENT: 44.6 %
PLATELET COUNT: 187 10*9/L (ref 150–450)
RED BLOOD CELL COUNT: 4.99 10*12/L (ref 4.26–5.60)
RED CELL DISTRIBUTION WIDTH: 14.8 % (ref 12.2–15.2)
WBC ADJUSTED: 3.6 10*9/L (ref 3.6–11.2)

## 2020-06-27 LAB — COMPREHENSIVE METABOLIC PANEL
ALBUMIN: 3.8 g/dL (ref 3.4–5.0)
ALKALINE PHOSPHATASE: 106 U/L (ref 46–116)
ALT (SGPT): 45 U/L (ref 10–49)
ANION GAP: 6 mmol/L (ref 5–14)
AST (SGOT): 35 U/L — ABNORMAL HIGH (ref <=34)
BILIRUBIN TOTAL: 0.6 mg/dL (ref 0.3–1.2)
BLOOD UREA NITROGEN: 21 mg/dL (ref 9–23)
BUN / CREAT RATIO: 14
CALCIUM: 9.8 mg/dL (ref 8.7–10.4)
CHLORIDE: 110 mmol/L — ABNORMAL HIGH (ref 98–107)
CO2: 21.6 mmol/L (ref 20.0–31.0)
CREATININE: 1.55 mg/dL — ABNORMAL HIGH
EGFR CKD-EPI AA MALE: 62 mL/min/{1.73_m2} (ref >=60)
EGFR CKD-EPI NON-AA MALE: 53 mL/min/{1.73_m2} — ABNORMAL LOW (ref >=60)
GLUCOSE RANDOM: 130 mg/dL — ABNORMAL HIGH (ref 70–99)
POTASSIUM: 4.3 mmol/L (ref 3.4–4.8)
PROTEIN TOTAL: 7.6 g/dL (ref 5.7–8.2)
SODIUM: 138 mmol/L (ref 135–145)

## 2020-06-27 LAB — PROTEIN / CREATININE RATIO, URINE
CREATININE, URINE: 98.4 mg/dL
PROTEIN URINE: 15.2 mg/dL
PROTEIN/CREAT RATIO, URINE: 0.154

## 2020-06-27 LAB — MAGNESIUM: MAGNESIUM: 1.6 mg/dL (ref 1.6–2.6)

## 2020-06-27 LAB — PARATHYROID HORMONE (PTH): PARATHYROID HORMONE INTACT: 141.4 pg/mL — ABNORMAL HIGH (ref 18.4–80.1)

## 2020-06-27 LAB — TACROLIMUS LEVEL, TROUGH: TACROLIMUS, TROUGH: 6.7 ng/mL (ref 5.0–15.0)

## 2020-06-27 MED ORDER — OLMESARTAN 20 MG TABLET
ORAL_TABLET | Freq: Every day | ORAL | 3 refills | 90 days | Status: CP
Start: 2020-06-27 — End: 2021-06-27

## 2020-06-27 MED ORDER — MYCOPHENOLATE SODIUM 180 MG TABLET,DELAYED RELEASE
ORAL_TABLET | Freq: Two times a day (BID) | ORAL | 11 refills | 30.00000 days | Status: CP
Start: 2020-06-27 — End: 2021-06-27

## 2020-06-27 MED ORDER — MAGNESIUM OXIDE 400 MG (241.3 MG MAGNESIUM) TABLET
ORAL_TABLET | Freq: Every day | ORAL | 11 refills | 30 days | Status: CP
Start: 2020-06-27 — End: 2021-06-27

## 2020-06-27 MED ORDER — TACROLIMUS 1 MG CAPSULE, IMMEDIATE-RELEASE
ORAL_CAPSULE | Freq: Two times a day (BID) | ORAL | 5 refills | 30.00000 days | Status: CP
Start: 2020-06-27 — End: 2020-12-24

## 2020-06-27 MED ORDER — MYFORTIC 180 MG TABLET,DELAYED RELEASE
ORAL_TABLET | Freq: Two times a day (BID) | ORAL | 11 refills | 30 days | Status: CP
Start: 2020-06-27 — End: 2020-06-27
  Filled 2020-07-04: qty 120, 30d supply, fill #0

## 2020-06-27 MED ORDER — CARVEDILOL 25 MG TABLET
ORAL_TABLET | Freq: Two times a day (BID) | ORAL | 11 refills | 30 days | Status: CP
Start: 2020-06-27 — End: 2021-06-27

## 2020-06-27 NOTE — Unmapped (Signed)
[facility] Specialty Medication Onboarding    Specialty Medication: Tacrolimus 1mg  capsule  Prior Authorization: Not Required   Financial Assistance: No - copay  <$25  Final Copay/Day Supply: $3 / 30 days    Insurance Restrictions: None     Notes to Pharmacist: Patient is filling with [facility] this month since he's there, but will be filling with [facility] next month. Mycophenolate RFTS 05/07, will re-test for onboarding on 05/09    The triage team has completed the benefits investigation and has determined that the patient is able to fill this medication at [facility]. Please contact the patient to complete the onboarding or follow up with the prescribing physician as needed.

## 2020-06-27 NOTE — Unmapped (Signed)
This onboarding is for the following:  1) Prograf  2) Myfortic      [facility]   Patient Onboarding/Medication Counseling    Patrick Vazquez is a 45 y.o. male with a kidney transplant who I am counseling today on continuation of therapy.  I am speaking to the patient.    Was a Nurse, learning disability used for this call? No    Verified patient's date of birth / HIPAA.    Specialty medication(s) to be sent: Transplant:  mycophenolic acid 180mg       Non-specialty medications/supplies to be sent: none      Medications not needed at this time: tacrolimus     The patient declined counseling on missed dose instructions, goals of therapy, side effects and monitoring parameters, warnings and precautions, drug/food interactions and storage, handling precautions, and disposal because they have taken the medication previously. The information in the declined sections below are for informational purposes only and was not discussed with patient.     Myfortic (mycophenolic acid)    Medication & Administration     Dosage:   ??? Take 2 tablets (360mg  total) two times a day.    Administration:   ??? Take with or without food, although taking with food helps minimize GI side effects.  ??? Swallow the pills whole, do not chew or crush    Adherence/Missed dose instructions:  ??? Take a missed dose as soon as you think about it.  ??? If it is less than 2 hours until your next dose, skip the missed dose and go back to your normal time.  ??? Do not take 2 doses at the same time or extra doses.    Goals of Therapy     ??? To prevent organ rejection    Side Effects & Monitoring Parameters     ??? Common side effects  ??? Back or joint pain  ??? Constipation  ??? Headache/dizziness  ??? Not hungry  ??? Stomach pain, diarrhea, constipation, gas, upset stomach, vomiting, nausea  ??? Feeling tired or weak  ??? Shakiness  ??? Trouble sleeping  ??? Increased risk of infection    ??? The following side effects should be reported to the provider:  ??? Allergic reaction  ??? High blood sugar (confusion, feeling sleepy, more thirst, more hungry, passing urine more often, flushing, fast breathing, or breath that smells like fruit)  ??? Electrolyte issues (mood changes, confusion, muscle pain or weakness, a heartbeat that does not feel normal, seizures, not hungry, or very bad upset stomach or throwing up)  ??? High or low blood pressure (bad headache or dizziness, passing out, or change in eyesight)  ??? Kidney issues (unable to pass urine, change in how much urine is passed, blood in the urine, or a big weight gain)  ??? Skin (oozing, heat, swelling, redness, or pain), UTI and other infections   ??? Chest pain or pressure  ??? Abnormal heartbeat  ??? Unexplained bleeding or bruising  ??? Abnormal burning, numbness, or tingling  ??? Muscle cramps,  ??? Yellowing of skin or eyes    ??? Monitoring parameters  ??? Pregnancy test initially prior to treatment and 8-10 days later then as needed)  ??? CBC weekly for first month then twice monthly for next 2 months, then monthly)  ??? Monitor Renal and liver functions  ??? Signs of organ rejection    Contraindications, Warnings, & Precautions     ??? *This is a REMS drug and an FDA-approved patient medication guide will  be printed with each dispensation  ??? Black Box Warning: Infections   ??? Black Box Warning: Lymphoproliferative disorders - risk of development of lymphoma and skin malignancy is increased  ??? Black Box Warning: Use during pregnancy is associated with increased risks of first trimester pregnancy loss and congenital malformations.   ??? Black Box Warning: Females of reproductive potential should use contraception during treatment and for 6 weeks after therapy is discontinued  ??? CNS depression  ??? New or reactivated viral infections  ??? Neutropenia  ??? Male patients and/or their male partners should use effective contraception during treatment of the male patient and for at least 3 months after last dose.  ??? Breastfeeding is not recommended during therapy and for 6 weeks after last dose    Drug/Food Interactions     ??? Medication list reviewed in Epic. The patient was instructed to inform the care team before taking any new medications or supplements. No drug interactions identified.   ??? Do not take Echinacea while on this medication  ??? Check with your doctor before getting any vaccinations (live or inactivated)    Storage, Handling Precautions, & Disposal     ??? Store at room temperature  ??? Keep away from children and pets  ??? This drug is considered hazardous and should be handled as little as possible.  Wash hands before and after touching pills. If someone else helps with medication administration, they should wear gloves.  The patient declined counseling on missed dose instructions, goals of therapy, side effects and monitoring parameters, warnings and precautions, drug/food interactions and storage, handling precautions, and disposal because they have taken the medication previously. The information in the declined sections below are for informational purposes only and was not discussed with patient.     Prograf (tacrolimus)    Medication & Administration     Dosage: Take 3 capsules (3mg  total) two times a day.     Administration:   ??? May take with or without food  ??? Take 12 hours apart    Adherence/Missed dose instructions:  ??? Take a missed dose as soon as you think about it.  ??? If it is close to the time for your next dose, skip the missed dose and go back to your normal time.  ??? Do not take 2 doses at the same time or extra doses.    Goals of Therapy     ??? To prevent organ rejection    Side Effects & Monitoring Parameters     ??? Common side effects  ??? Dizziness  ??? Fatigue  ??? Headache  ??? Stuffy nose or sore throat  ??? Nausea, vomiting, stomach pain, diarrhea, constipation  ??? Heartburn  ??? Back or joint pain  ??? Increased risk of infection    ??? The following side effects should be reported to the provider:  ??? Allergic reaction  ??? Kidney issues (change in quantity or urine passed, blood in urine, or weight gain)  ??? High blood pressure (dizziness, change in eyesight, headache)  ??? Electrolyte issues (change in mood, confusion, muscle pain, or weakness)  ??? Abnormal breathing  ??? Shakiness  ??? Unexplained bleeding or bruising (gums bleeding, blood in urine, nosebleeds, any abnormal bleeding)  ??? Signs of infection (fever, cough, wounds that will not heal)  ??? Skin changes (sores, paleness, new or changed bumps or moles)    ??? Monitoring Parameters  ??? Renal function  ??? Liver function  ??? Glucose levels  ??? Blood pressure  ???  Tacrolimus trough levels  ??? Cardiac monitoring (for QT prolongation)      Contraindications, Warnings, & Precautions     ??? Black Box Warning: Infections - immunosuppressant agents increase the risk of infection that may lead to hospitalization or death  ??? Black Box Warning: Malignancy - immunosuppressant agents may be associated with the development of malignancies that may lead to hospitalization or death  ??? Limit or avoid sun and ultraviolet light exposure, use appropriate sun protection  ??? Myocardial hypertrophy -avoid use in patients with congenital long QT syndrome  ??? Diabetes mellitus - the risk for new-onset diabetes and insulin-dependent post-transplant diabetes mellitus is increased with tacrolimus use after transplantation  ??? GI perforation  ??? Hyperkalemia  ??? Hypertension  ??? Nephrotoxicity  ??? Neurotoxicity  ??? This is a narrow therapeutic index drug. Do not switch manufacturers without first talking to the provider.    Drug/Food Interactions     ??? Medication list reviewed in Epic. The patient was instructed to inform the care team before taking any new medications or supplements. No drug interactions identified.   ??? Avoid alcohol  ??? Avoid grapefruit or grapefruit juice  ??? Avoid live vaccines    Storage, Handling Precautions, & Disposal     ??? Store at room temperature  ??? Keep away from children and pets      Current Medications (including OTC/herbals), Comorbidities and Allergies     Current Outpatient Medications   Medication Sig Dispense Refill   ??? acetaminophen (TYLENOL) 325 MG tablet Take 1-2 tablets (325-650 mg total) by mouth every four (4) hours as needed for pain. 100 tablet 0   ??? amLODIPine (NORVASC) 10 MG tablet Take 1 tablet (10 mg total) by mouth daily. 90 tablet 3   ??? aspirin (ECOTRIN) 81 MG tablet Take 1 tablet (81 mg total) by mouth daily. 30 tablet 11   ??? atorvastatin (LIPITOR) 40 MG tablet Take 1 tablet (40 mg total) by mouth every evening. 30 tablet 11   ??? blood sugar diagnostic Strp Test Blood Sugar four times a day; E11.9 120 strip 5   ??? carvediloL (COREG) 25 MG tablet Take 1 tablet (25 mg total) by mouth Two (2) times a day. 60 tablet 11   ??? cholecalciferol, vitamin D3, (VITAMIN D3) 25 mcg (1,000 unit) Chew Chew 1 tablet daily.     ??? insulin lispro (HUMALOG) 100 unit/mL injection pen Inject 8 Units under the skin Three (3) times a day before meals. Dx code: E11.9 Diabetes (Patient taking differently: Inject 8 Units under the skin continuous. Dx code: E11.9 Diabetes) 15 mL 5   ??? insulin syringe-needle U-100 0.5 mL 29 gauge x 1/2 (12 mm) Syrg E11.9  Diabtes; check blood sugar 4 times a day 120 each 5   ??? magnesium oxide (MAG-OX) 400 mg (241.3 mg elemental magnesium) tablet Take 1 tablet (400 mg total) by mouth daily. 30 tablet 11   ??? mycophenolate (MYFORTIC) 180 MG EC tablet Take 2 tablets (360 mg total) by mouth Two (2) times a day. Z94.0; 120 tablet 11   ??? olmesartan (BENICAR) 20 MG tablet Take 1 tablet (20 mg total) by mouth daily. 90 tablet 3   ??? subcutaneous insulin pump Misc by Miscellaneous route continuous.     ??? tacrolimus (PROGRAF) 1 MG capsule Take 3 capsules (3 mg total) by mouth two (2) times a day. Z94.0 180 capsule 5     No current facility-administered medications for this visit.  No Known Allergies    Patient Active Problem List   Diagnosis   ??? 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 Diabetes mellitus type 1, controlled, with complications (CMS-HCC)   ??? Diabetic retinopathy associated with type 1 diabetes mellitus (CMS-HCC)   ??? Diastolic dysfunction, left ventricle   ??? Gastroesophageal reflux disease with esophagitis (RAF-HCC)   ??? Gastritis   ??? Former smoker   ??? Kidney replaced by transplant   ??? Diabetic ketoacidosis associated with type 1 diabetes mellitus (CMS-HCC)   ??? Hypertension   ??? Chronic pain syndrome   ??? Aftercare following organ transplant       Reviewed and up to date in Epic.    Appropriateness of Therapy     Acute infections noted within Epic:  No active infections  Patient reported infection: None    Is medication and dose appropriate based on diagnosis and infection status? Yes    Prescription has been clinically reviewed: Yes      Baseline Quality of Life Assessment      How many days over the past month did your kidney transplant  keep you from your normal activities? For example, brushing your teeth or getting up in the morning. 0    Financial Information     Medication Assistance provided: None Required    Anticipated copay of $3 each tacrolimus and mycophenolate reviewed with patient. Verified delivery address.    Delivery Information     Scheduled delivery date: 07/05/20    Expected start date: 07/05/20    Medication will be delivered via UPS to the prescription address in [facility].  This shipment will not require a signature.      Explained the services we provide at [facility] and that each month we would call to set up refills.  Stressed importance of returning phone calls so that we could ensure they receive their medications in time each month.  Informed patient that we should be setting up refills 7-10 days prior to when they will run out of medication.  A pharmacist will reach out to perform a clinical assessment periodically.  Informed patient that a welcome packet, containing information about our pharmacy and other support services, a Notice of Privacy Practices, and a drug information handout will be sent.      Patient verbalized understanding of the above information as well as how to contact the pharmacy at [phone] option 4 with any questions/concerns.  The pharmacy is open Monday through Friday 8:30am-4:30pm.  A pharmacist is available 24/7 via pager to answer any clinical questions they may have.    Patient Specific Needs     - Does the patient have any physical, cognitive, or cultural barriers? No    - Patient prefers to have medications discussed with  Patient     - Is the patient or caregiver able to read and understand education materials at a high school level or above? Yes    - Patient's primary language is  English     - Is the patient high risk? Yes, patient is taking a REMS drug. Medication is dispensed in compliance with REMS program    - Does the patient require a Care Management Plan? No     - Does the patient require physician intervention or other additional services (i.e. nutrition, smoking cessation, social work)? No      Tera Helper  [facility] Specialty Pharmacist

## 2020-06-27 NOTE — Unmapped (Signed)
[facility]   CHRONIC TRANSPLANT FOLLOW UP   ??  PCP: [facility]   ??  Date of Visit at Transplant clinic: 06/26/2020  ??  Graft Status: Stable.  ??  Assessment/Recommendations: Patrick Vazquez 45 y.o. 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  ??  1) s/p Kidney txp - 01/28/16 secondary to DM - 1  Creatinine - value 1.55 mg/dL. Baseline 1.4 - 1.7   Urine analysis: negative protein/ < 1 WBC/ < 1 RBC   Urine protein/creatinine ratio: 0.154  DSA: negative - 01/04/20    Last CMV checked: 137 - 06/27/20  Last BKV checked: not detected - 01/04/20  - Valcyte discontinued 07/2017    RUS today showed no masses.     2) Immunosuppression: Mycophenolate 360 mg BID / Prograf 3/3 mg   - Prograf level 6.7, last dose taken at 9:30 pm.  - Goal Prograf trough: 5-7.  - Changes in Immunosuppression - none. Will move Prograf to shared services.   - Medications side effects: Yes - leukopenia.  -Will increase Myfortic to 540 mg BID once WBC stable (>4) for several readings. Today's WBC 3.6.  ??  3) HTN - elevated; 148/102 in clinic today (likely d/t weight gain)  Goal for B.P - <130/80  Changes in B.P medications - Start low dose olmesartan 20 mg daily in the PM until patient loses weight.     4) Anemia - stable  Goal for Hemoglobin > 11.0   ??  5) MBD - Calcium/Phosphorus - WNL.  PTH 67  - will monitor  ??  6) Electrolytes: WNL.  Stable on mag oxide 400 mg BID.  ??  7) Immunizations:  Influenza (inactivated only): 01/04/20  Pneumococcal vaccination (inactivated only): per pt completed at dialysis center; unable to find records in Epic  Covid: Moderna 04/27/19, 06/01/19, 11/01/19, 06/27/20. Counseled patient on Evusheld and provided him with information to read over.   Shingrix: eligible once turns 49    8) Cancer screening:   - Now eligible for colonoscopy; patient informed  - Annual cxr today showed clear lungs  ??  9) RUE and neck acute DVT 03/04/16 - Resolved.  No longer taking Eliquis.  ??  10) D.M - Uncontrolled in past.  Continues to follow up with endocrinology.   Has an insulin pump.       11) Erectile Dysfunction - Not discussed today.  - prescribed sildanefil by urology.  ??  Follow up: 6 months.  ??  History of Presenting Illness:     Since patient's last visit in the transplant clinic - patient has been doing well in terms of transplant, taking transplant medications regularly, no episodes of rejection and no side effects of medications.    Today he presents feeling overall well. He indicates that he is aware of his recent weight gain and will work on weight loss. His BP is concerning in clinic today - 148/102. He is currently taking amlodipine 10 mg daily and carvedilol 25 mg BID. Per patient report, sugars are well-controlled. Patient notes an improved diet and has been using the Dexcom glucose monitor.    Patient got married in the interim.                 Last dose of Prograf at 9:30 p.m.              Diabetes: Yes, type 1.  Duration: since age 56.                           Insulin Dependent: Yes, using pens.                            Controlled: Yes                           Endocrinologist: Dr. Raeford Vazquez at [place].               HTN: Yes, diagnosed in 2014.                          Controlled:Yes (typicaly 130s-140s/60-70s)              CAD/Heartfailure:No               Last Echo/Ejection fraction: TTE Left ventricular hypertrophy - moderate                          Normal left ventricular systolic function, ejection fraction > 55%              Adherence                            With Medication: yes.                          With Follow up: yes.                           Functional Status: Independent.   ??  H/o Stroke: 10/2013 due to uncontrolled BP readings. He had weakness of the left lower extremity and was admitted to [facility]. He has recovered from stroke and only limitation has been not able to run. He never required rehab after the stroke.  ??  Review of Systems: ??  Fever or chills: negative   Sore throat: negative   Fatigue/malaise: negative   Weight loss or gain: +weight gain   New skin rash/lump or bump: negative   Problems with teeth/gums: negative   Chest pain: negative   Cough or shortness of breath: negative   Swelling: negative   Abdominal pain/heartburn/nausea/vomiting or diarrhea: negative   Pain or bleeding when urinating: negative   Twitching/numbness or weakness: negative   ??  Physical Exam:   BP 148/102 (BP Site: R Arm, BP Position: Sitting, BP Cuff Size: X-Large)  - Pulse 73  - Temp 36.3 ??C (97.4 ??F) (Temporal)  - Ht (!) 6 cm (2.36)  - Wt (!) 134.3 kg (296 lb)  - BMI 37302.22 kg/m??     Nursing note and vitals reviewed.   Constitutional: Oriented to person, place, and time. Appears well-developed and well-nourished. No distress.   HENT: Wearing mask   Head: Normocephalic and atraumatic.   Eyes: Right eye exhibits no discharge. Left eye exhibits no discharge. No scleral icterus.   Neck: Normal range of motion. Neck supple.   Cardiovascular: Normal rate and regular rhythm. Exam reveals no gallop and no friction rub. No murmur heard.   Pulmonary/Chest: Effort normal and breath sounds normal. No respiratory distress.   Abdominal: Soft. Non tender  Musculoskeletal: Normal range of motion. No  edema and no tenderness.   Neurological: Alert and oriented to person, place, and time.   Skin: Skin is warm and dry. No rash noted. Not diaphoretic. No erythema. No pallor.   Psychiatric: Normal mood and affect. Behavior is normal. Judgment and thought content normal.     Renal Transplant History:               Race:  AA              Age of recipient (at time of transplant): 41              Cause of kidney disease: D.M - type 1.              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                          - Donor creatinine: 0.85.                           - Infection in donor: Blood culture(s) - 1 of 2 cultures are positive for coag neg staph aureus                          - Ischemia time: 18 hours 15 minutes.                          - Crossmatch: Negative.                          - Donor kidney biopsy:Zero-hour implantation graft biopsy:                                      - Moderate to severe arteriosclerosis observed in some of the vascular cross sections.                                       - Mild acute tubular injury.               Induction: Alemtuzumab              Maintenance IS at the time of transplant: Myfortic/Prograf              DGF: Yes   ??  Allergies: No Known Allergies   ??  Current Medications:     Current Outpatient Medications   Medication Sig Dispense Refill   ??? acetaminophen (TYLENOL) 325 MG tablet Take 1-2 tablets (325-650 mg total) by mouth every four (4) hours as needed for pain. 100 tablet 0   ??? amLODIPine (NORVASC) 10 MG tablet Take 1 tablet (10 mg total) by mouth daily. 90 tablet 3   ??? aspirin (ECOTRIN) 81 MG tablet Take 1 tablet (81 mg total) by mouth daily. 30 tablet 11   ??? atorvastatin (LIPITOR) 40 MG tablet Take 1 tablet (40 mg  total) by mouth every evening. 30 tablet 11   ??? blood sugar diagnostic Strp Test Blood Sugar four times a day; E11.9 120 strip 5   ??? carvediloL (COREG) 25 MG tablet Take 1 tablet (25 mg total) by mouth Two (2) times a day. 60 tablet 11   ??? cholecalciferol, vitamin D3, (VITAMIN D3) 25 mcg (1,000 unit) Chew Chew 1 tablet daily.     ??? insulin lispro (HUMALOG) 100 unit/mL injection pen Inject 8 Units under the skin Three (3) times a day before meals. Dx code: E11.9 Diabetes (Patient taking differently: Inject 8 Units under the skin continuous. Dx code: E11.9 Diabetes) 15 mL 5   ??? insulin syringe-needle U-100 0.5 mL 29 gauge x 1/2 (12 mm) Syrg E11.9  Diabtes; check blood sugar 4 times a day 120 each 5   ??? magnesium oxide (MAG-OX) 400 mg (241.3 mg magnesium) tablet Take 1 tablet (400 mg total) by mouth daily. 30 tablet 11   ??? MYFORTIC 180 mg EC tablet Take 2 tablets (360 mg total) by mouth Two (2) times a day. Z94.0; 120 tablet 11   ??? PROGRAF 1 mg capsule Take 3 capsules (3 mg total) by mouth two (2) times a day. Z94.0 180 capsule 5   ??? subcutaneous insulin pump Misc by Miscellaneous route continuous.       No current facility-administered medications for this visit.     Past Medical History:   Past Medical History:   Diagnosis Date   ??? CVA (cerebral vascular accident) (CMS-HCC) 10/2013   ??? Diabetes mellitus (CMS-HCC)     Type 1   ??? ESRD (end stage renal disease) (CMS-HCC)    ??? HTN (hypertension)      Laboratory studies:   No results found for this or any previous visit (from the past 170 hour(s)).    Scribe's Attestation: Leeroy Bock, MD obtained and performed the history, physical exam and medical decision making elements that were entered into the chart.  Signed by Sharion Balloon, Scribe, on Jun 27, 2020 at 11:20 AM.    Documentation assistance provided by the Scribe. I was present during the time the encounter was recorded. The information recorded by the Scribe was done at my direction and has been reviewed and validated by me.

## 2020-06-28 LAB — CMV DNA, QUANTITATIVE, PCR
CMV QUANT LOG10: 2.14 {Log_IU}/mL — ABNORMAL HIGH (ref <0.00)
CMV QUANT: 137 [IU]/mL — ABNORMAL HIGH (ref <0)
CMV VIRAL LD: DETECTED — AB

## 2020-06-28 LAB — EBV QUANTITATIVE PCR, BLOOD: EBV VIRAL LOAD RESULT: NOT DETECTED

## 2020-06-28 LAB — VITAMIN D 25 HYDROXY: VITAMIN D, TOTAL (25OH): 28.2 ng/mL (ref 20.0–80.0)

## 2020-06-29 LAB — HIV ANTIGEN/ANTIBODY COMBO: HIV ANTIGEN/ANTIBODY COMBO: NONREACTIVE

## 2020-07-02 LAB — BK VIRUS QUANTITATIVE PCR, BLOOD: BK BLOOD RESULT: NOT DETECTED

## 2020-07-02 LAB — HLA DS POST TRANSPLANT
ANTI-DONOR DRW #1 MFI: 394 MFI
ANTI-DONOR HLA-A #1 MFI: 42 MFI
ANTI-DONOR HLA-A #2 MFI: 13 MFI
ANTI-DONOR HLA-B #1 MFI: 23 MFI
ANTI-DONOR HLA-B #2 MFI: 0 MFI
ANTI-DONOR HLA-C #1 MFI: 45 MFI
ANTI-DONOR HLA-C #2 MFI: 0 MFI
ANTI-DONOR HLA-DQB #1 MFI: 133 MFI
ANTI-DONOR HLA-DQB #2 MFI: 265 MFI
ANTI-DONOR HLA-DR #1 MFI: 99 MFI

## 2020-07-02 LAB — FSAB CLASS 1 ANTIBODY SPECIFICITY: HLA CLASS 1 ANTIBODY RESULT: NEGATIVE

## 2020-07-02 LAB — FSAB CLASS 2 ANTIBODY SPECIFICITY: HLA CL2 AB RESULT: NEGATIVE

## 2020-07-02 NOTE — Unmapped (Signed)
[facility] Specialty Medication Onboarding    Specialty Medication: Mycophenolate 180mg  EC tablet  Prior Authorization: Not Required   Financial Assistance: No - copay  <$25  Final Copay/Day Supply: $3 / 30 days    Insurance Restrictions: None     Notes to Pharmacist: N/A    The triage team has completed the benefits investigation and has determined that the patient is able to fill this medication at [facility]. Please contact the patient to complete the onboarding or follow up with the prescribing physician as needed.

## 2020-07-03 LAB — VITAMIN D 1,25 DIHYDROXY: VITAMIN D 1,25-DIHYDROXY: 43 pg/mL

## 2020-07-17 NOTE — Unmapped (Signed)
[facility] Clinical Assessment & Refill Coordination Note    Patrick Vazquez, [place]: 02/11/1976  Phone: [phone] (home)     All above HIPAA information was verified with patient.     Was a Nurse, learning disability used for this call? No    Specialty Medication(s):   Transplant:  mycophenolic acid 180mg  and tacrolimus 1mg      Current Outpatient Medications   Medication Sig Dispense Refill   ??? acetaminophen (TYLENOL) 325 MG tablet Take 1-2 tablets (325-650 mg total) by mouth every four (4) hours as needed for pain. 100 tablet 0   ??? amLODIPine (NORVASC) 10 MG tablet Take 1 tablet (10 mg total) by mouth daily. 90 tablet 3   ??? aspirin (ECOTRIN) 81 MG tablet Take 1 tablet (81 mg total) by mouth daily. 30 tablet 11   ??? atorvastatin (LIPITOR) 40 MG tablet Take 1 tablet (40 mg total) by mouth every evening. 30 tablet 11   ??? blood sugar diagnostic Strp Test Blood Sugar four times a day; E11.9 120 strip 5   ??? carvediloL (COREG) 25 MG tablet Take 1 tablet (25 mg total) by mouth Two (2) times a day. 60 tablet 11   ??? cholecalciferol, vitamin D3, (VITAMIN D3) 25 mcg (1,000 unit) Chew Chew 1 tablet daily.     ??? insulin lispro (HUMALOG) 100 unit/mL injection pen Inject 8 Units under the skin Three (3) times a day before meals. Dx code: E11.9 Diabetes (Patient taking differently: Inject 8 Units under the skin continuous. Dx code: E11.9 Diabetes) 15 mL 5   ??? insulin syringe-needle U-100 0.5 mL 29 gauge x 1/2 (12 mm) Syrg E11.9  Diabtes; check blood sugar 4 times a day 120 each 5   ??? magnesium oxide (MAG-OX) 400 mg (241.3 mg elemental magnesium) tablet Take 1 tablet (400 mg total) by mouth daily. 30 tablet 11   ??? mycophenolate (MYFORTIC) 180 MG EC tablet Take 2 tablets (360 mg total) by mouth Two (2) times a day. 120 tablet 11   ??? olmesartan (BENICAR) 20 MG tablet Take 1 tablet (20 mg total) by mouth daily. 90 tablet 3   ??? subcutaneous insulin pump Misc by Miscellaneous route continuous.       No current facility-administered medications for this visit.        Changes to medications: [name] reports no changes at this time.    No Known Allergies    Changes to allergies: No    SPECIALTY MEDICATION ADHERENCE     Tacrolimus 1 mg: 14 days of medicine on hand   Mycophenolate 180 mg: 14 days of medicine on hand       Medication Adherence    Patient reported X missed doses in the last month: 0  Specialty Medication: Tacrolimus 1mg   Patient is on additional specialty medications: Yes  Additional Specialty Medications: Mycophenolate 180mg   Patient Reported Additional Medication X Missed Doses in the Last Month: 0  Patient is on more than two specialty medications: No          Specialty medication(s) dose(s) confirmed: Regimen is correct and unchanged.     Are there any concerns with adherence? No    Adherence counseling provided? Not needed    CLINICAL MANAGEMENT AND INTERVENTION      Clinical Benefit Assessment:    Do you feel the medicine is effective or helping your condition? Yes    Clinical Benefit counseling provided? Not needed    Adverse Effects Assessment:    Are  you experiencing any side effects? No    Are you experiencing difficulty administering your medicine? 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 For example, brushing your teeth or getting up in the morning. 0    Have you discussed this with your provider? Not needed    Acute Infection Status:    Acute infections noted within Epic:  No active infections  Patient reported infection: None    Therapy Appropriateness:    Is therapy appropriate? Yes, therapy is appropriate and should be continued    DISEASE/MEDICATION-SPECIFIC INFORMATION      N/A    PATIENT SPECIFIC NEEDS     - Does the patient have any physical, cognitive, or cultural barriers? No    - Is the patient high risk? Yes, patient is taking a REMS drug. Medication is dispensed in compliance with REMS program    - Does the patient require a Care Management Plan? No     - Does the patient require physician intervention or other additional services (i.e. nutrition, smoking cessation, social work)? No      SHIPPING     Specialty Medication(s) to be Shipped:   Transplant: None- patient declined mycophenolate and tacrolimus today.    Other medication(s) to be shipped: No additional medications requested for fill at this time     Changes to insurance: No    Delivery Scheduled: Patient declined refill at this time due to has at least 2 weeks on hand..     Medication will be delivered via UPS to the confirmed prescription address in [facility].    The patient will receive a drug information handout for each medication shipped and additional FDA Medication Guides as required.  Verified that patient has previously received a Conservation officer, historic buildings and a Surveyor, mining.    The patient or caregiver noted above participated in the development of this care plan and knows that they can request review of or adjustments to the care plan at any time.      All of the patient's questions and concerns have been addressed.    Tera Helper   [facility] Specialty Pharmacist

## 2020-07-24 DIAGNOSIS — Z940 Kidney transplant status: Principal | ICD-10-CM

## 2020-07-24 MED ORDER — TACROLIMUS 1 MG CAPSULE, IMMEDIATE-RELEASE
ORAL_CAPSULE | Freq: Two times a day (BID) | ORAL | 5 refills | 30.00000 days | Status: CP
Start: 2020-07-24 — End: 2021-01-20
  Filled 2020-07-25: qty 180, 30d supply, fill #0

## 2020-07-24 NOTE — Unmapped (Signed)
[facility] Refill Coordination Note    Specialty Medication(s) to be Shipped:   Transplant: mycophenolate mofetil 180mg  and tacrolimus 1mg     Other medication(s) to be shipped: No additional medications requested for fill at this time     Patrick Vazquez, DOB: 09/16/1975  Phone: [phone] (home)       All above HIPAA information was verified with patient.     Was a Nurse, learning disability used for this call? No    Completed refill call assessment today to schedule patient's medication shipment from the [facility] [phone]).  All relevant notes have been reviewed.     Specialty medication(s) and dose(s) confirmed: Regimen is correct and unchanged.   Changes to medications: Patrick Vazquez reports no changes at this time.  Changes to insurance: No  New side effects reported not previously addressed with a pharmacist or physician: None reported  Questions for the pharmacist: No    Confirmed patient received a Conservation officer, historic buildings and a Surveyor, mining with first shipment. The patient will receive a drug information handout for each medication shipped and additional FDA Medication Guides as required.       DISEASE/MEDICATION-SPECIFIC INFORMATION        N/A    SPECIALTY MEDICATION ADHERENCE     Medication Adherence    Patient reported X missed doses in the last month: 0  Specialty Medication: Mycophenolate 180mg   Patient is on additional specialty medications: Yes  Additional Specialty Medications: Tacrolimus 1mg   Patient Reported Additional Medication X Missed Doses in the Last Month: 0  Patient is on more than two specialty medications: No       Were doses missed due to medication being on hold? No    Mycophenolate 180 mg: 10 days of medicine on hand   Tacrolimus 1 mg: 2 days of medicine on hand       REFERRAL TO PHARMACIST     Referral to the pharmacist: Not needed      [facility]     Shipping address confirmed in Epic.     Delivery Scheduled: Yes, Expected medication delivery date: 07/26/2020.      **Mycophenolate delivery on 08/02/2020**    Medication will be delivered via UPS to the prescription address in Epic WAM.    Lorelei Pont [facility] Specialty Technician

## 2020-08-01 MED FILL — MYCOPHENOLATE SODIUM 180 MG TABLET,DELAYED RELEASE: ORAL | 30 days supply | Qty: 120 | Fill #1

## 2020-08-17 NOTE — Unmapped (Signed)
[facility] Refill Coordination Note    Specialty Medication(s) to be Shipped:   Transplant: mycophenolate mofetil 180mg  and tacrolimus 1mg     Other medication(s) to be shipped: No additional medications requested for fill at this time     Patrick Vazquez, DOB: [birth year]  Phone: [phone] (home)       All above HIPAA information was verified with patient.     Was a Nurse, learning disability used for this call? No    Completed refill call assessment today to schedule patient's medication shipment from the [facility] [phone]).  All relevant notes have been reviewed.     Specialty medication(s) and dose(s) confirmed: Regimen is correct and unchanged.   Changes to medications: Patrick Vazquez reports no changes at this time.  Changes to insurance: No  New side effects reported not previously addressed with a pharmacist or physician: None reported  Questions for the pharmacist: No    Confirmed patient received a Conservation officer, historic buildings and a Surveyor, mining with first shipment. The patient will receive a drug information handout for each medication shipped and additional FDA Medication Guides as required.       DISEASE/MEDICATION-SPECIFIC INFORMATION        N/A    SPECIALTY MEDICATION ADHERENCE     Medication Adherence    Patient reported X missed doses in the last month: 0  Specialty Medication: Tacrolimus 1mg   Patient is on additional specialty medications: Yes  Additional Specialty Medications: Mycophenolate 180mg   Patient Reported Additional Medication X Missed Doses in the Last Month: 0  Patient is on more than two specialty medications: No        Were doses missed due to medication being on hold? No    Tacrolimus 1 mg: 10 days of medicine on hand   Mycophenolate 180 mg: 14 days of medicine on hand     REFERRAL TO PHARMACIST     Referral to the pharmacist: Not needed      [facility]     Shipping address confirmed in Epic.     Delivery Scheduled: Yes, Expected medication delivery date: 08/24/2020.     Medication will be delivered via UPS to the prescription address in Epic WAM.    Lorelei Pont [facility] Specialty Technician

## 2020-08-23 MED FILL — TACROLIMUS 1 MG CAPSULE, IMMEDIATE-RELEASE: ORAL | 30 days supply | Qty: 180 | Fill #1

## 2020-08-23 MED FILL — MYCOPHENOLATE SODIUM 180 MG TABLET,DELAYED RELEASE: ORAL | 30 days supply | Qty: 120 | Fill #2

## 2020-09-14 NOTE — Unmapped (Signed)
[facility] Refill Coordination Note    Specialty Medication(s) to be Shipped:   Transplant: tacrolimus 1mg     Other medication(s) to be shipped: No additional medications requested for fill at this time     Josua Ferrebee, DOB: 08-23-1975  Phone: [phone] (home)       All above HIPAA information was verified with patient.     Was a Nurse, learning disability used for this call? No    Completed refill call assessment today to schedule patient's medication shipment from the [facility] [phone]).  All relevant notes have been reviewed.     Specialty medication(s) and dose(s) confirmed: Regimen is correct and unchanged.   Changes to medications: Ryheem reports no changes at this time.  Changes to insurance: No  New side effects reported not previously addressed with a pharmacist or physician: None reported  Questions for the pharmacist: No    Confirmed patient received a Conservation officer, historic buildings and a Surveyor, mining with first shipment. The patient will receive a drug information handout for each medication shipped and additional FDA Medication Guides as required.       DISEASE/MEDICATION-SPECIFIC INFORMATION        N/A    SPECIALTY MEDICATION ADHERENCE     Medication Adherence    Patient reported X missed doses in the last month: 0  Specialty Medication: tacrolimus 1mg   Patient is on additional specialty medications: Yes              Were doses missed due to medication being on hold? No    Tacrolimus 1mg   : 9 days of medicine on hand       REFERRAL TO PHARMACIST     Referral to the pharmacist: Not needed      [facility]     Shipping address confirmed in Epic.     Delivery Scheduled: Yes, Expected medication delivery date: 7/28.     Medication will be delivered via UPS to the prescription address in Epic WAM.    Westley Gambles   [facility] Specialty Technician

## 2020-09-19 MED FILL — TACROLIMUS 1 MG CAPSULE, IMMEDIATE-RELEASE: ORAL | 30 days supply | Qty: 180 | Fill #2

## 2020-09-27 NOTE — Unmapped (Signed)
[facility] Pharm[facility] Refill Coordination Note    Specialty Medication(s) to be Shipped:   Transplant:  mycophenolic acid 180mg     Other medication(s) to be shipped: No additional medications requested for fill at this time     Reeve Turnley, DOB: 03-Jan-1976  Phone: [phone] (home)       All above HIPAA information was verified with patient.     Was a Nurse, learning disability used for this call? No    Completed refill call assessment today to schedule patient's medication shipment from the [facility] [phone]).  All relevant notes have been reviewed.     Specialty medication(s) and dose(s) confirmed: Regimen is correct and unchanged.   Changes to medications: Treylen reports no changes at this time.  Changes to insurance: No  New side effects reported not previously addressed with a pharmacist or physician: None reported  Questions for the pharmacist: No    Confirmed patient received a Conservation officer, historic buildings and a Surveyor, mining with first shipment. The patient will receive a drug information handout for each medication shipped and additional FDA Medication Guides as required.       DISEASE/MEDICATION-SPECIFIC INFORMATION        N/A    SPECIALTY MEDICATION ADHERENCE     Medication Adherence    Patient reported X missed doses in the last month: 0  Specialty Medication: mycophenolate (MYFORTIC) 180 MG EC tablet  Patient is on additional specialty medications: No              Were doses missed due to medication being on hold? No    mycophenolate (MYFORTIC) 180 MG  8 days worth of medication on hand.      REFERRAL TO PHARMACIST     Referral to the pharmacist: Not needed      [facility]     Shipping address confirmed in Epic.     Delivery Scheduled: Yes, Expected medication delivery date: 10/02/20.     Medication will be delivered via UPS to the prescription address in Epic WAM.    [place] A Sharmila Wrobleski   [facility] Specialty Technician

## 2020-10-01 MED FILL — MYCOPHENOLATE SODIUM 180 MG TABLET,DELAYED RELEASE: ORAL | 30 days supply | Qty: 120 | Fill #3

## 2020-10-09 MED ORDER — ATORVASTATIN 40 MG TABLET
ORAL_TABLET | Freq: Every evening | ORAL | 11 refills | 30.00000 days | Status: CP
Start: 2020-10-09 — End: 2020-10-09

## 2020-10-09 MED ORDER — CARVEDILOL 25 MG TABLET
ORAL_TABLET | Freq: Two times a day (BID) | ORAL | 3 refills | 90.00000 days | Status: CP
Start: 2020-10-09 — End: 2021-10-09

## 2020-10-18 NOTE — Unmapped (Signed)
[facility] Refill Coordination Note    Specialty Medication(s) to be Shipped:   Transplant: mycophenolate mofetil 180mg  and tacrolimus 1mg     Other medication(s) to be shipped: No additional medications requested for fill at this time     Patrick Vazquez, DOB: September 08, 1975  Phone: [phone] (home) [phone] (work)      All above HIPAA information was verified with patient.     Was a Nurse, learning disability used for this call? No    Completed refill call assessment today to schedule patient's medication shipment from the [facility] [phone]).  All relevant notes have been reviewed.     Specialty medication(s) and dose(s) confirmed: Regimen is correct and unchanged.   Changes to medications: Darian reports no changes at this time.  Changes to insurance: No  New side effects reported not previously addressed with a pharmacist or physician: None reported  Questions for the pharmacist: No    Confirmed patient received a Conservation officer, historic buildings and a Surveyor, mining with first shipment. The patient will receive a drug information handout for each medication shipped and additional FDA Medication Guides as required.       DISEASE/MEDICATION-SPECIFIC INFORMATION        N/A    SPECIALTY MEDICATION ADHERENCE     Medication Adherence    Patient reported X missed doses in the last month: 0  Specialty Medication: tacrolimus 1 MG capsule (PROGRAF)  Patient is on additional specialty medications: Yes  Additional Specialty Medications: mycophenolate 180 MG EC tablet (MYFORTIC)  Patient Reported Additional Medication X Missed Doses in the Last Month: 0  Patient is on more than two specialty medications: No  Any gaps in refill history greater than 2 weeks in the last 3 months: no  Demonstrates understanding of importance of adherence: yes  Informant: patient  Reliability of informant: reliable  Confirmed plan for next specialty medication refill: delivery by pharmacy  Refills needed for supportive medications: not needed              Were doses missed due to medication being on hold? No    mycophenolate 180 MG: 12 days of medicine on hand   tacrolimus 1 MG : 7 days of medicine on hand        REFERRAL TO PHARMACIST     Referral to the pharmacist: Not needed      [facility]     Shipping address confirmed in Epic.     Delivery Scheduled: Yes, Expected medication delivery date: 10/26/20.     Medication will be delivered via UPS to the prescription address in Epic WAM.    Yolonda Kida   [facility] Specialty Technician

## 2020-10-25 MED FILL — TACROLIMUS 1 MG CAPSULE, IMMEDIATE-RELEASE: ORAL | 30 days supply | Qty: 180 | Fill #3

## 2020-10-25 MED FILL — MYCOPHENOLATE SODIUM 180 MG TABLET,DELAYED RELEASE: ORAL | 30 days supply | Qty: 120 | Fill #4

## 2020-11-19 NOTE — Unmapped (Signed)
[facility] Clinical Assessment & Refill Coordination Note    Patrick Vazquez [place], [place]: 1975/10/14  Phone: [phone] (home) [phone] (work)    All above HIPAA information was verified with patient.     Was a Nurse, learning disability used for this call? No    Specialty Medication(s):   Transplant:  mycophenolic acid 180mg  and tacrolimus 1mg      Current Outpatient Medications   Medication Sig Dispense Refill   ??? acetaminophen (TYLENOL) 325 MG tablet Take 1-2 tablets (325-650 mg total) by mouth every four (4) hours as needed for pain. 100 tablet 0   ??? amLODIPine (NORVASC) 10 MG tablet Take 1 tablet (10 mg total) by mouth daily. 90 tablet 3   ??? aspirin (ECOTRIN) 81 MG tablet Take 1 tablet (81 mg total) by mouth daily. 30 tablet 11   ??? atorvastatin (LIPITOR) 40 MG tablet Take 1 tablet (40 mg total) by mouth every evening. 90 tablet 3   ??? blood sugar diagnostic Strp Test Blood Sugar four times a day; E11.9 120 strip 5   ??? carvediloL (COREG) 25 MG tablet Take 1 tablet (25 mg total) by mouth Two (2) times a day. 180 tablet 3   ??? cholecalciferol, vitamin D3, (VITAMIN D3) 25 mcg (1,000 unit) Chew Chew 1 tablet daily.     ??? insulin lispro (HUMALOG) 100 unit/mL injection pen Inject 8 Units under the skin Three (3) times a day before meals. Dx code: E11.9 Diabetes (Patient taking differently: Inject 8 Units under the skin continuous. Dx code: E11.9 Diabetes) 15 mL 5   ??? insulin syringe-needle U-100 0.5 mL 29 gauge x 1/2 (12 mm) Syrg E11.9  Diabtes; check blood sugar 4 times a day 120 each 5   ??? magnesium oxide (MAG-OX) 400 mg (241.3 mg elemental magnesium) tablet Take 1 tablet (400 mg total) by mouth daily. 30 tablet 11   ??? mycophenolate (MYFORTIC) 180 MG EC tablet Take 2 tablets (360 mg total) by mouth Two (2) times a day. 120 tablet 11   ??? olmesartan (BENICAR) 20 MG tablet Take 1 tablet (20 mg total) by mouth daily. 90 tablet 3   ??? subcutaneous insulin pump Misc by Miscellaneous route continuous. ??? tacrolimus (PROGRAF) 1 MG capsule Take 3 capsules (3 mg total) by mouth two (2) times a day. 180 capsule 5     No current facility-administered medications for this visit.        Changes to medications: [name] reports no changes at this time.    No Known Allergies    Changes to allergies: No    SPECIALTY MEDICATION ADHERENCE     Mycophenolate 180mg   : 5 days of medicine on hand   Tacrolimus 1mg   : 5 days of medicine on hand       Medication Adherence    Patient reported X missed doses in the last month: 0  Specialty Medication: mycophenolate 180mg   Patient is on additional specialty medications: Yes  Additional Specialty Medications: Tacrolimus 1mg   Patient Reported Additional Medication X Missed Doses in the Last Month: 0          Specialty medication(s) dose(s) confirmed: Regimen is correct and unchanged.     Are there any concerns with adherence? No    Adherence counseling provided? Not needed    CLINICAL MANAGEMENT AND INTERVENTION      Clinical Benefit Assessment:    Do you feel the medicine is effective or helping your condition? Yes    Clinical Benefit counseling  provided? Not needed    Adverse Effects Assessment:    Are you experiencing any side effects? No    Are you experiencing difficulty administering your medicine? No    Quality of Life Assessment:    How many days over the past month did your transplant  keep you from your normal activities? For example, brushing your teeth or getting up in the morning. 0    Have you discussed this with your provider? Not needed    Acute Infection Status:    Acute infections noted within Epic:  No active infections  Patient reported infection: None    Therapy Appropriateness:    Is therapy appropriate and patient progressing towards therapeutic goals? Yes, therapy is appropriate and should be continued    DISEASE/MEDICATION-SPECIFIC INFORMATION      N/A    PATIENT SPECIFIC NEEDS     - Does the patient have any physical, cognitive, or cultural barriers? No    - Is the patient high risk? Yes, patient is taking a REMS drug. Medication is dispensed in compliance with REMS program    - Does the patient require a Care Management Plan? No     - Does the patient require physician intervention or other additional services (i.e. nutrition, smoking cessation, social work)? No      SHIPPING     Specialty Medication(s) to be Shipped:   Transplant:  mycophenolic acid 180mg  and tacrolimus 1mg     Other medication(s) to be shipped: No additional medications requested for fill at this time     Changes to insurance: No    Delivery Scheduled: Yes, Expected medication delivery date: 11/22/2020.     Medication will be delivered via UPS to the confirmed prescription address in [facility]    The patient will receive a drug information handout for each medication shipped and additional FDA Medication Guides as required.  Verified that patient has previously received a Conservation officer, historic buildings and a Surveyor, mining.    The patient or caregiver noted above participated in the development of this care plan and knows that they can request review of or adjustments to the care plan at any time.      All of the patient's questions and concerns have been addressed.    Thad Ranger   [facility] Specialty Pharmacist

## 2020-11-21 LAB — URINALYSIS
BACTERIA: NONE SEEN
BILIRUBIN UA: NEGATIVE
BLOOD UA: NEGATIVE
GLUCOSE UA: NEGATIVE
KETONES UA: NEGATIVE
LEUKOCYTE ESTERASE UA: NEGATIVE
NITRITE UA: NEGATIVE
PH UA: 5.5 (ref 5.0–8.0)
PROTEIN UA: NEGATIVE
RBC UA: NONE SEEN
SPECIFIC GRAVITY UA: 1.017 (ref 1.001–1.035)
SQUAMOUS EPITHELIAL: NONE SEEN
WBC UA: NONE SEEN

## 2020-11-21 LAB — CBC W/ DIFFERENTIAL
BASOPHILS ABSOLUTE COUNT: 0.3
BASOPHILS ABSOLUTE COUNT: 0.8
BASOPHILS RELATIVE PERCENT: 0.2 %
BASOPHILS RELATIVE PERCENT: 0.8 %
EOSINOPHILS ABSOLUTE COUNT: 0.108
EOSINOPHILS ABSOLUTE COUNT: 0.141
EOSINOPHILS RELATIVE PERCENT: 2.7 %
EOSINOPHILS RELATIVE PERCENT: 3.7 %
HEMATOCRIT: 44 (ref 38.5–50.0)
HEMATOCRIT: 45.5 (ref 38.5–50.0)
HEMOGLOBIN: 14.2 g/dL (ref 13.2–17.1)
HEMOGLOBIN: 14.6 g/dL (ref 13.2–17.1)
LYMPHOCYTES ABSOLUTE COUNT: 1.468
LYMPHOCYTES ABSOLUTE COUNT: 1.577
LYMPHOCYTES RELATIVE PERCENT: 36.7
LYMPHOCYTES RELATIVE PERCENT: 41.5
MEAN CORPUSCULAR HEMOGLOBIN CONC: 32.1 % (ref 32.0–36.0)
MEAN CORPUSCULAR HEMOGLOBIN CONC: 32.3 % (ref 32.0–36.0)
MEAN CORPUSCULAR HEMOGLOBIN: 27.5 pg (ref 27.0–33.0)
MEAN CORPUSCULAR HEMOGLOBIN: 27.5 pg (ref 27.0–33.0)
MEAN CORPUSCULAR VOLUME: 85.3 fL (ref 80.0–100.0)
MEAN CORPUSCULAR VOLUME: 85.7 fL (ref 80.0–100.0)
MEAN PLATELET VOLUME: 10.4 fL (ref 7.5–12.5)
MEAN PLATELET VOLUME: 10.7 fL (ref 7.5–12.5)
MONOCYTES ABSOLUTE COUNT: 0.316
MONOCYTES ABSOLUTE COUNT: 0.331
MONOCYTES RELATIVE PERCENT: 7.9
MONOCYTES RELATIVE PERCENT: 8.7
NEUTROPHILS ABSOLUTE COUNT: 1.721
NEUTROPHILS ABSOLUTE COUNT: 2.1
NEUTROPHILS RELATIVE PERCENT: 45.3
NEUTROPHILS RELATIVE PERCENT: 52.5
PLATELET COUNT: 232 10*3/uL (ref 140–400)
PLATELET COUNT: 234 10*3/uL (ref 140–400)
RED BLOOD CELL COUNT: 5.16 10*6/uL (ref 4.20–5.80)
RED BLOOD CELL COUNT: 5.31 10*6/uL (ref 4.20–5.80)
RED CELL DISTRIBUTION WIDTH: 13.8 % (ref 11.0–15.0)
RED CELL DISTRIBUTION WIDTH: 14.3 % (ref 11.0–15.0)
WHITE BLOOD CELL COUNT: 3.8 10*3/uL (ref 3.8–10.8)
WHITE BLOOD CELL COUNT: 4 10*3/uL (ref 3.8–10.8)

## 2020-11-21 LAB — RENAL FUNCTION PANEL
ALBUMIN: 4.1 g/dL (ref 3.6–5.1)
ALBUMIN: 4.2 g/dL (ref 3.6–5.1)
BLOOD UREA NITROGEN: 25 mg/dL (ref 7–25)
BLOOD UREA NITROGEN: 25 mg/dL (ref 7–25)
BUN / CREAT RATIO: 15 (calc)
BUN / CREAT RATIO: 15 (calc) (ref 6–22)
CALCIUM: 10 mg/dL (ref 8.6–10.3)
CALCIUM: 9.7 mg/dL (ref 8.6–10.3)
CHLORIDE: 109 mmol/L (ref 98–110)
CHLORIDE: 111 — ABNORMAL HIGH
CO2: 18 mmol/L — ABNORMAL LOW (ref 20–32)
CO2: 18 mmol/L — ABNORMAL LOW (ref 20–32)
CREATININE: 1.62 mL/min/{1.73_m2} — ABNORMAL HIGH
CREATININE: 1.67 mg/dL — ABNORMAL HIGH (ref 0.60–1.35)
EGFR MDRD AF AMER: 53 — ABNORMAL LOW
EGFR MDRD AF AMER: 56 mL/min/{1.73_m2} — ABNORMAL LOW
EGFR MDRD NON AF AMER: 49 mL/min/{1.73_m2} — ABNORMAL LOW
GLUCOSE RANDOM: 106 mg/dL — ABNORMAL HIGH (ref 65–99)
GLUCOSE RANDOM: 190 mg/dL — ABNORMAL HIGH (ref 65–99)
POTASSIUM: 4.6
POTASSIUM: 4.7 mmol/L (ref 3.5–5.3)
SODIUM: 138 mmol/L (ref 135–146)
SODIUM: 139 mmol/L

## 2020-11-21 LAB — PROTEIN / CREATININE RATIO, URINE
CREATININE, URINE: 153 mg/dL (ref 20–320)
CREATININE, URINE: 87
PROTEIN URINE: 12
PROTEIN URINE: 9 mg/dL (ref 5–25)
PROTEIN/CREAT RATIO, URINE: 0.059
PROTEIN/CREAT RATIO, URINE: 0.138 (ref 0.025–0.148)

## 2020-11-21 LAB — URINALYSIS WITH CULTURE REFLEX
BLOOD UA: NEGATIVE
GLUCOSE UA: NEGATIVE
KETONES UA: NEGATIVE
PH UA: 6 (ref 5.0–8.0)
PROTEIN UA: NEGATIVE
SPECIFIC GRAVITY UA: 1.014 (ref 1.001–1.035)

## 2020-11-21 LAB — PARATHYROID HORMONE (PTH)
PARATHYROID HORMONE INTACT: 106 — ABNORMAL HIGH (ref 16–77)
PARATHYROID HORMONE INTACT: 72 (ref 16–77)

## 2020-11-21 LAB — MAGNESIUM: MAGNESIUM: 1.5 mg/dL (ref 1.5–2.5)

## 2020-11-21 LAB — PHOSPHORUS
PHOSPHORUS: 1.9 mg/dL — ABNORMAL LOW (ref 2.5–4.5)
PHOSPHORUS: 2.6 mg/dL (ref 2.5–4.5)

## 2020-11-21 LAB — TACROLIMUS LEVEL: TACROLIMUS BLOOD: 5.8

## 2020-11-21 MED FILL — TACROLIMUS 1 MG CAPSULE, IMMEDIATE-RELEASE: ORAL | 30 days supply | Qty: 180 | Fill #4

## 2020-11-21 MED FILL — MYCOPHENOLATE SODIUM 180 MG TABLET,DELAYED RELEASE: ORAL | 30 days supply | Qty: 120 | Fill #5

## 2020-11-28 MED ORDER — AMLODIPINE 10 MG TABLET
ORAL_TABLET | Freq: Every day | ORAL | 3 refills | 90 days | Status: CP
Start: 2020-11-28 — End: 2021-11-28

## 2020-11-28 NOTE — Unmapped (Signed)
Script sent to local pharmacy.  

## 2020-11-30 DIAGNOSIS — Z940 Kidney transplant status: Principal | ICD-10-CM

## 2020-12-13 NOTE — Unmapped (Signed)
[facility] Refill Coordination Note    Specialty Medication(s) to be Shipped:   Transplant: mycophenolate mofetil 180mg  and tacrolimus 1mg     Other medication(s) to be shipped: No additional medications requested for fill at this time     Patrick Vazquez, DOB: 1975/12/16  Phone: [phone] (home) [phone] (work)      All above HIPAA information was verified with patient.     Was a Nurse, learning disability used for this call? No    Completed refill call assessment today to schedule patient's medication shipment from the [facility] [phone]).  All relevant notes have been reviewed.     Specialty medication(s) and dose(s) confirmed: Regimen is correct and unchanged.   Changes to medications: Yovan reports no changes at this time.  Changes to insurance: No  New side effects reported not previously addressed with a pharmacist or physician: None reported  Questions for the pharmacist: No    Confirmed patient received a Conservation officer, historic buildings and a Surveyor, mining with first shipment. The patient will receive a drug information handout for each medication shipped and additional FDA Medication Guides as required.       DISEASE/MEDICATION-SPECIFIC INFORMATION        N/A    SPECIALTY MEDICATION ADHERENCE     Medication Adherence    Patient reported X missed doses in the last month: 0  Specialty Medication: Mycophenolate 180mg   Patient is on additional specialty medications: Yes  Additional Specialty Medications: Tacrolimus 1mg   Patient Reported Additional Medication X Missed Doses in the Last Month: 0  Patient is on more than two specialty medications: No  Any gaps in refill history greater than 2 weeks in the last 3 months: no  Demonstrates understanding of importance of adherence: yes  Informant: patient  Reliability of informant: reliable  Confirmed plan for next specialty medication refill: delivery by pharmacy  Refills needed for supportive medications: not needed Were doses missed due to medication being on hold? No    tacrolimus 1 MG: 8 days of medicine on hand   mycophenolate 180 MG: 8 days of medicine on hand       REFERRAL TO PHARMACIST     Referral to the pharmacist: Not needed      [facility]     Shipping address confirmed in Epic.     Delivery Scheduled: Yes, Expected medication delivery date: 12/19/20.     Medication will be delivered via UPS to the prescription address in Epic WAM.    Yolonda Kida   [facility] Specialty Technician

## 2020-12-18 MED FILL — MYCOPHENOLATE SODIUM 180 MG TABLET,DELAYED RELEASE: ORAL | 30 days supply | Qty: 120 | Fill #6

## 2020-12-18 MED FILL — TACROLIMUS 1 MG CAPSULE, IMMEDIATE-RELEASE: ORAL | 30 days supply | Qty: 180 | Fill #5

## 2021-01-10 DIAGNOSIS — Z940 Kidney transplant status: Principal | ICD-10-CM

## 2021-01-10 MED ORDER — MYCOPHENOLATE SODIUM 180 MG TABLET,DELAYED RELEASE
ORAL_TABLET | Freq: Two times a day (BID) | ORAL | 11 refills | 30 days
Start: 2021-01-10 — End: 2022-01-10

## 2021-01-10 MED ORDER — TACROLIMUS 1 MG CAPSULE, IMMEDIATE-RELEASE
ORAL_CAPSULE | Freq: Two times a day (BID) | ORAL | 5 refills | 30 days
Start: 2021-01-10 — End: 2021-07-09

## 2021-01-10 NOTE — Unmapped (Signed)
[facility] Refill Coordination Note    Specialty Medication(s) to be Shipped:   Transplant: mycophenolate mofetil 180mg  and tacrolimus 1mg     Other medication(s) to be shipped: No additional medications requested for fill at this time     Patrick Vazquez, DOB: [birth year]  Phone: [phone] (home) [phone] (work)      All above HIPAA information was verified with patient.     Was a Nurse, learning disability used for this call? No    Completed refill call assessment today to schedule patient's medication shipment from the [facility] [phone]).  All relevant notes have been reviewed.     Specialty medication(s) and dose(s) confirmed: Regimen is correct and unchanged.   Changes to medications: Patrick Vazquez reports no changes at this time.  Changes to insurance: No  New side effects reported not previously addressed with a pharmacist or physician: None reported  Questions for the pharmacist: No    Confirmed patient received a Conservation officer, historic buildings and a Surveyor, mining with first shipment. The patient will receive a drug information handout for each medication shipped and additional FDA Medication Guides as required.       DISEASE/MEDICATION-SPECIFIC INFORMATION        N/A    SPECIALTY MEDICATION ADHERENCE     Medication Adherence    Patient reported X missed doses in the last month: 0  Specialty Medication: Mycophenolate 180mg   Patient is on additional specialty medications: Yes  Additional Specialty Medications: Tacrolimus 1mg   Patient Reported Additional Medication X Missed Doses in the Last Month: 0  Patient is on more than two specialty medications: No        Were doses missed due to medication being on hold? No    Mycophenolate 180 mg: 11 days of medicine on hand   Tacrolimus 1 mg: 11 days of medicine on hand     REFERRAL TO PHARMACIST     Referral to the pharmacist: Not needed      SHIPPING     Shipping address confirmed in Epic.     Delivery Scheduled: Yes, Expected medication delivery date: 01/16/2021.     Medication will be delivered via UPS to the prescription address in Epic WAM.    Patrick Vazquez [facility] Specialty Technician

## 2021-01-11 MED ORDER — TACROLIMUS 1 MG CAPSULE, IMMEDIATE-RELEASE
ORAL_CAPSULE | Freq: Two times a day (BID) | ORAL | 5 refills | 30.00000 days | Status: CP
Start: 2021-01-11 — End: 2021-07-10
  Filled 2021-01-15: qty 540, 90d supply, fill #0

## 2021-01-11 MED ORDER — MYCOPHENOLATE SODIUM 180 MG TABLET,DELAYED RELEASE
ORAL_TABLET | Freq: Two times a day (BID) | ORAL | 11 refills | 30 days | Status: CP
Start: 2021-01-11 — End: 2022-01-11
  Filled 2021-01-15: qty 360, 90d supply, fill #0

## 2021-02-21 DIAGNOSIS — Z79899 Other long term (current) drug therapy: Principal | ICD-10-CM

## 2021-02-21 DIAGNOSIS — Z940 Kidney transplant status: Principal | ICD-10-CM

## 2021-02-26 NOTE — Unmapped (Signed)
[name]   CHRONIC TRANSPLANT FOLLOW UP   ??  PCP: [facility]   ??  Date of Visit at Transplant clinic: 02/26/2021  ??  Graft Status: Stable.  ??  Assessment/Recommendations: Patrick Vazquez 46 y.o. 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  ??  1) s/p Kidney txp - 01/28/16 secondary to DM - 1  Creatinine - value 1.55 mg/dL. Baseline 1.4 - 1.7   Urine analysis: negative protein/ < 1 WBC/ < 1 RBC   Urine protein/creatinine ratio: 0.154  DSA: negative - 01/04/20    Last CMV checked: 137 - 06/27/20  Last BKV checked: not detected - 01/04/20  - Valcyte discontinued 07/2017    RUS today showed no masses.     2) Immunosuppression: Mycophenolate 360 mg BID / Prograf 3/3 mg   - Prograf level 6.7, last dose taken at 9:30 pm.  - Goal Prograf trough: 5-7.  - Changes in Immunosuppression - none. Will move Prograf to shared services.   - Medications side effects: Yes - leukopenia.  -Will increase Myfortic to 540 mg BID once WBC stable (>4) for several readings. Today's WBC 3.6.  ??  3) HTN - elevated; 148/102 in clinic today (likely d/t weight gain)  Goal for B.P - <130/80  Changes in B.P medications - Start low dose olmesartan 20 mg daily in the PM until patient loses weight.     4) Anemia - stable  Goal for Hemoglobin > 11.0   ??  5) MBD - Calcium/Phosphorus - WNL.  PTH 67  - will monitor  ??  6) Electrolytes: WNL.  Stable on mag oxide 400 mg BID.  ??  7) Immunizations:  Influenza (inactivated only): 01/04/20  Pneumococcal vaccination (inactivated only): per pt completed at dialysis center; unable to find records in Epic  Covid: Moderna 04/27/19, 06/01/19, 11/01/19, 06/27/20. Counseled patient on Evusheld and provided him with information to read over.   Shingrix: eligible once turns 49    8) Cancer screening:   - Now eligible for colonoscopy; patient informed  - Annual cxr today showed clear lungs  ??  9) RUE and neck acute DVT 03/04/16 - Resolved.  No longer taking Eliquis.  ??  10) D.M - Uncontrolled in past.  Continues to follow up with endocrinology.   Has an insulin pump.       11) Erectile Dysfunction - Not discussed today.  - prescribed sildanefil by urology.  ??  Follow up: 6 months.  ??  History of Presenting Illness:     Since patient's last visit in the transplant clinic - patient has been doing well in terms of transplant, taking transplant medications regularly, no episodes of rejection and no side effects of medications.    Today he presents feeling overall well. He indicates that he is aware of his recent weight gain and will work on weight loss. His BP is concerning in clinic today - 148/102. He is currently taking amlodipine 10 mg daily and carvedilol 25 mg BID. Per patient report, sugars are well-controlled. Patient notes an improved diet and has been using the Dexcom glucose monitor.    Patient got married in the interim.                 Last dose of Prograf at 9:30 p.m.              Diabetes: Yes, type 1.  Duration: since age 37.                           Insulin Dependent: Yes, using pens.                            Controlled: Yes                           Endocrinologist: Dr. [name] at [place].               HTN: Yes, diagnosed in 2014.                          Controlled:Yes (typicaly 130s-140s/60-70s)              CAD/Heartfailure:No               Last Echo/Ejection fraction: TTE Left ventricular hypertrophy - moderate                          Normal left ventricular systolic function, ejection fraction > 55%              Adherence                            With Medication: yes.                          With Follow up: yes.                           Functional Status: Independent.   ??  H/o Stroke: 10/2013 due to uncontrolled BP readings. He had weakness of the left lower extremity and was admitted to [facility]. He has recovered from stroke and only limitation has been not able to run. He never required rehab after the stroke.  ??  Review of Systems: ??  Fever or chills: negative   Sore throat: negative   Fatigue/malaise: negative   Weight loss or gain: +weight gain   New skin rash/lump or bump: negative   Problems with teeth/gums: negative   Chest pain: negative   Cough or shortness of breath: negative   Swelling: negative   Abdominal pain/heartburn/nausea/vomiting or diarrhea: negative   Pain or bleeding when urinating: negative   Twitching/numbness or weakness: negative   ??  Physical Exam:   There were no vitals taken for this visit.    Nursing note and vitals reviewed.   Constitutional: Oriented to person, place, and time. Appears well-developed and well-nourished. No distress.   HENT: Wearing mask   Head: Normocephalic and atraumatic.   Eyes: Right eye exhibits no discharge. Left eye exhibits no discharge. No scleral icterus.   Neck: Normal range of motion. Neck supple.   Cardiovascular: Normal rate and regular rhythm. Exam reveals no gallop and no friction rub. No murmur heard.   Pulmonary/Chest: Effort normal and breath sounds normal. No respiratory distress.   Abdominal: Soft. Non tender  Musculoskeletal: Normal range of motion. No edema and no tenderness.   Neurological: Alert and oriented to person, place, and time.   Skin: Skin is warm and dry. No rash noted. Not diaphoretic. No erythema. No pallor.   Psychiatric: Normal mood and  affect. Behavior is normal. Judgment and thought content normal.     Renal Transplant History:               Race:  AA              Age of recipient (at time of transplant): 40              Cause of kidney disease: D.M - type 1.              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                          - Donor creatinine: 0.85.                           - Infection in donor:                                      Blood culture(s) - 1 of 2 cultures are positive for coag neg staph aureus                          - Ischemia time: 18 hours 15 minutes.                          - Crossmatch: Negative.                          - Donor kidney biopsy:Zero-hour implantation graft biopsy:                                      - Moderate to severe arteriosclerosis observed in some of the vascular cross sections.                                       - Mild acute tubular injury.               Induction: Alemtuzumab              Maintenance IS at the time of transplant: Myfortic/Prograf              DGF: Yes   ??  Allergies: No Known Allergies   ??  Current Medications:     Current Outpatient Medications   Medication Sig Dispense Refill   ??? acetaminophen (TYLENOL) 325 MG tablet Take 1-2 tablets (325-650 mg total) by mouth every four (4) hours as needed for pain. 100 tablet 0   ??? amLODIPine (NORVASC) 10 MG tablet Take 1 tablet (10 mg total) by mouth daily. 90 tablet 3   ??? aspirin (ECOTRIN) 81 MG tablet Take 1 tablet (81 mg total) by mouth daily. 30 tablet 11   ??? atorvastatin (LIPITOR) 40 MG tablet Take 1 tablet (40 mg total)  by mouth every evening. 90 tablet 3   ??? blood sugar diagnostic Strp Test Blood Sugar four times a day; E11.9 120 strip 5   ??? carvediloL (COREG) 25 MG tablet Take 1 tablet (25 mg total) by mouth Two (2) times a day. 180 tablet 3   ??? cholecalciferol, vitamin D3, (VITAMIN D3) 25 mcg (1,000 unit) Chew Chew 1 tablet daily.     ??? insulin lispro (HUMALOG) 100 unit/mL injection pen Inject 8 Units under the skin Three (3) times a day before meals. Dx code: E11.9 Diabetes (Patient taking differently: Inject 8 Units under the skin continuous. Dx code: E11.9 Diabetes) 15 mL 5   ??? insulin syringe-needle U-100 0.5 mL 29 gauge x 1/2 (12 mm) Syrg E11.9  Diabtes; check blood sugar 4 times a day 120 each 5   ??? magnesium oxide (MAG-OX) 400 mg (241.3 mg elemental magnesium) tablet Take 1 tablet (400 mg total) by mouth daily. 30 tablet 11   ??? mycophenolate (MYFORTIC) 180 MG EC tablet Take 2 tablets (360 mg total) by mouth Two (2) times a day. 120 tablet 11 ??? olmesartan (BENICAR) 20 MG tablet Take 1 tablet (20 mg total) by mouth daily. 90 tablet 3   ??? subcutaneous insulin pump Misc by Miscellaneous route continuous.     ??? tacrolimus (PROGRAF) 1 MG capsule Take 3 capsules (3 mg total) by mouth two (2) times a day. 180 capsule 5     No current facility-administered medications for this visit.     Past Medical History:   Past Medical History:   Diagnosis Date   ??? CVA (cerebral vascular accident) (CMS-HCC) 10/2013   ??? Diabetes mellitus (CMS-HCC)     Type 1   ??? ESRD (end stage renal disease) (CMS-HCC)    ??? HTN (hypertension)      Laboratory studies:   No results found for this or any previous visit (from the past 170 hour(s)).

## 2021-02-27 ENCOUNTER — Ambulatory Visit: Admit: 2021-02-27 | Discharge: 2021-02-27 | Payer: BLUE CROSS/BLUE SHIELD

## 2021-02-27 ENCOUNTER — Ambulatory Visit: Admit: 2021-02-27 | Discharge: 2021-02-27 | Payer: BLUE CROSS/BLUE SHIELD | Attending: Nephrology | Primary: Nephrology

## 2021-02-27 DIAGNOSIS — Z940 Kidney transplant status: Principal | ICD-10-CM

## 2021-02-27 LAB — LIPID PANEL
CHOLESTEROL/HDL RATIO SCREEN: 3.7 (ref 1.0–4.5)
CHOLESTEROL: 122 mg/dL (ref <=200)
HDL CHOLESTEROL: 33 mg/dL — ABNORMAL LOW (ref 40–60)
LDL CHOLESTEROL CALCULATED: 57 mg/dL (ref 40–99)
NON-HDL CHOLESTEROL: 89 mg/dL (ref 70–130)
TRIGLYCERIDES: 161 mg/dL — ABNORMAL HIGH (ref 0–150)
VLDL CHOLESTEROL CAL: 32.2 mg/dL (ref 11–50)

## 2021-02-27 LAB — TACROLIMUS LEVEL, TROUGH: TACROLIMUS, TROUGH: 12.7 ng/mL (ref 5.0–15.0)

## 2021-02-27 LAB — COMPREHENSIVE METABOLIC PANEL
ALBUMIN: 3.6 g/dL (ref 3.4–5.0)
ALKALINE PHOSPHATASE: 115 U/L (ref 46–116)
ALT (SGPT): 64 U/L — ABNORMAL HIGH (ref 10–49)
ANION GAP: 12 mmol/L (ref 5–14)
AST (SGOT): 43 U/L — ABNORMAL HIGH (ref <=34)
BILIRUBIN TOTAL: 0.4 mg/dL (ref 0.3–1.2)
BLOOD UREA NITROGEN: 25 mg/dL — ABNORMAL HIGH (ref 9–23)
BUN / CREAT RATIO: 14
CALCIUM: 9.9 mg/dL (ref 8.7–10.4)
CHLORIDE: 109 mmol/L — ABNORMAL HIGH (ref 98–107)
CO2: 19.2 mmol/L — ABNORMAL LOW (ref 20.0–31.0)
CREATININE: 1.74 mg/dL — ABNORMAL HIGH
EGFR CKD-EPI (2021) MALE: 48 mL/min/{1.73_m2} — ABNORMAL LOW (ref >=60)
GLUCOSE RANDOM: 104 mg/dL — ABNORMAL HIGH (ref 70–99)
POTASSIUM: 4.5 mmol/L (ref 3.4–4.8)
PROTEIN TOTAL: 7.8 g/dL (ref 5.7–8.2)
SODIUM: 140 mmol/L (ref 135–145)

## 2021-02-27 LAB — MAGNESIUM: MAGNESIUM: 1.7 mg/dL (ref 1.6–2.6)

## 2021-02-27 LAB — CBC W/ AUTO DIFF
BASOPHILS ABSOLUTE COUNT: 0 10*9/L (ref 0.0–0.1)
BASOPHILS RELATIVE PERCENT: 0.4 %
EOSINOPHILS ABSOLUTE COUNT: 0.1 10*9/L (ref 0.0–0.5)
EOSINOPHILS RELATIVE PERCENT: 2.6 %
HEMATOCRIT: 41.4 % (ref 39.0–48.0)
HEMOGLOBIN: 13.8 g/dL (ref 12.9–16.5)
LYMPHOCYTES ABSOLUTE COUNT: 1.4 10*9/L (ref 1.1–3.6)
LYMPHOCYTES RELATIVE PERCENT: 47.4 %
MEAN CORPUSCULAR HEMOGLOBIN CONC: 33.4 g/dL (ref 32.0–36.0)
MEAN CORPUSCULAR HEMOGLOBIN: 27.9 pg (ref 25.9–32.4)
MEAN CORPUSCULAR VOLUME: 83.6 fL (ref 77.6–95.7)
MEAN PLATELET VOLUME: 8.7 fL (ref 6.8–10.7)
MONOCYTES ABSOLUTE COUNT: 0.4 10*9/L (ref 0.3–0.8)
MONOCYTES RELATIVE PERCENT: 12.7 %
NEUTROPHILS ABSOLUTE COUNT: 1.1 10*9/L — ABNORMAL LOW (ref 1.8–7.8)
NEUTROPHILS RELATIVE PERCENT: 36.9 %
NUCLEATED RED BLOOD CELLS: 0 /100{WBCs} (ref <=4)
PLATELET COUNT: 225 10*9/L (ref 150–450)
RED BLOOD CELL COUNT: 4.95 10*12/L (ref 4.26–5.60)
RED CELL DISTRIBUTION WIDTH: 14.6 % (ref 12.2–15.2)
WBC ADJUSTED: 2.9 10*9/L — ABNORMAL LOW (ref 3.6–11.2)

## 2021-02-27 LAB — PARATHYROID HORMONE (PTH): PARATHYROID HORMONE INTACT: 186.4 pg/mL — ABNORMAL HIGH (ref 18.4–80.1)

## 2021-02-27 LAB — HEMOGLOBIN A1C
ESTIMATED AVERAGE GLUCOSE: 154 mg/dL
HEMOGLOBIN A1C: 7 % — ABNORMAL HIGH (ref 4.8–5.6)

## 2021-02-27 LAB — PHOSPHORUS: PHOSPHORUS: 3.1 mg/dL (ref 2.4–5.1)

## 2021-02-27 NOTE — Unmapped (Signed)
AOBP r arm large cuff  Average 119/80 (75)  1st 113/77 (79)  2nd 129/85 (72)  3rd 116/78 (74)    Per provider, the patient received influenza vaccine.  Patient ID verified with name and date of birth.  All screening questions were answered.  Vaccine(s) were administered as ordered.  See immunization history for documentation.  Patient tolerated the injection(s) well with no issues noted.  Vaccine Information sheet given to the patient.

## 2021-02-28 LAB — EBV QUANTITATIVE PCR, BLOOD: EBV VIRAL LOAD RESULT: NOT DETECTED

## 2021-03-01 LAB — VITAMIN D 25 HYDROXY: VITAMIN D, TOTAL (25OH): 32.9 ng/mL (ref 20.0–80.0)

## 2021-03-01 LAB — CMV DNA, QUANTITATIVE, PCR
CMV QUANT: <50 [IU]/mL — ABNORMAL HIGH (ref <0)
CMV VIRAL LD: DETECTED — AB

## 2021-03-01 LAB — BK VIRUS QUANTITATIVE PCR, BLOOD: BK BLOOD RESULT: NOT DETECTED

## 2021-03-01 LAB — HIV ANTIGEN/ANTIBODY COMBO: HIV ANTIGEN/ANTIBODY COMBO: NONREACTIVE

## 2021-03-01 MED ORDER — INSULIN SYRINGE U-100 WITH NEEDLE 1/2 ML 31 GAUGE X 15/64" (6 MM)
11 refills | 0.00000 days | Status: CP
Start: 2021-03-01 — End: ?

## 2021-03-03 LAB — VITAMIN D 1,25 DIHYDROXY: VITAMIN D 1,25-DIHYDROXY: 41 pg/mL

## 2021-03-12 LAB — HLA DS POST TRANSPLANT
ANTI-DONOR DRW #1 MFI: 574 MFI
ANTI-DONOR DRW #2 MFI: 673 MFI
ANTI-DONOR HLA-A #1 MFI: 69 MFI
ANTI-DONOR HLA-A #2 MFI: 27 MFI
ANTI-DONOR HLA-B #1 MFI: 108 MFI
ANTI-DONOR HLA-B #2 MFI: 113 MFI
ANTI-DONOR HLA-C #1 MFI: 73 MFI
ANTI-DONOR HLA-C #2 MFI: 60 MFI
ANTI-DONOR HLA-DP #2 MFI: 602 MFI — ABNORMAL HIGH
ANTI-DONOR HLA-DQB #1 MFI: 203 MFI
ANTI-DONOR HLA-DQB #2 MFI: 570 MFI
ANTI-DONOR HLA-DR #1 MFI: 305 MFI

## 2021-03-12 LAB — FSAB CLASS 2 ANTIBODY SPECIFICITY: HLA CL2 AB RESULT: POSITIVE

## 2021-03-12 LAB — FSAB CLASS 1 ANTIBODY SPECIFICITY: HLA CLASS 1 ANTIBODY RESULT: NEGATIVE

## 2021-03-15 DIAGNOSIS — Z940 Kidney transplant status: Principal | ICD-10-CM

## 2021-03-15 DIAGNOSIS — Z79899 Other long term (current) drug therapy: Principal | ICD-10-CM

## 2021-03-15 DIAGNOSIS — E118 Type 2 diabetes mellitus with unspecified complications: Principal | ICD-10-CM

## 2021-03-15 NOTE — Unmapped (Signed)
Patient requested to repeat Post transplant HLA locally advised that he can repeat it after a month at a [facility] lab.

## 2021-04-02 ENCOUNTER — Ambulatory Visit: Admit: 2021-04-02 | Discharge: 2021-04-02 | Payer: BLUE CROSS/BLUE SHIELD

## 2021-04-02 DIAGNOSIS — Z940 Kidney transplant status: Principal | ICD-10-CM

## 2021-04-02 DIAGNOSIS — E118 Type 2 diabetes mellitus with unspecified complications: Principal | ICD-10-CM

## 2021-04-02 LAB — COMPREHENSIVE METABOLIC PANEL
ALBUMIN: 3.9 g/dL (ref 3.4–5.0)
ALKALINE PHOSPHATASE: 108 U/L (ref 46–116)
ALT (SGPT): 52 U/L — ABNORMAL HIGH (ref 10–49)
ANION GAP: 12 mmol/L (ref 5–14)
AST (SGOT): 46 U/L — ABNORMAL HIGH (ref <=34)
BILIRUBIN TOTAL: 0.4 mg/dL (ref 0.3–1.2)
BLOOD UREA NITROGEN: 26 mg/dL — ABNORMAL HIGH (ref 9–23)
BUN / CREAT RATIO: 16
CALCIUM: 9.9 mg/dL (ref 8.7–10.4)
CHLORIDE: 109 mmol/L — ABNORMAL HIGH (ref 98–107)
CO2: 17.8 mmol/L — ABNORMAL LOW (ref 20.0–31.0)
CREATININE: 1.65 mg/dL — ABNORMAL HIGH
EGFR CKD-EPI (2021) MALE: 52 mL/min/{1.73_m2} — ABNORMAL LOW (ref >=60)
GLUCOSE RANDOM: 109 mg/dL — ABNORMAL HIGH (ref 70–99)
POTASSIUM: 4.3 mmol/L (ref 3.4–4.8)
PROTEIN TOTAL: 8 g/dL (ref 5.7–8.2)
SODIUM: 139 mmol/L (ref 135–145)

## 2021-04-02 LAB — PHOSPHORUS: PHOSPHORUS: 3 mg/dL (ref 2.4–5.1)

## 2021-04-02 LAB — CBC W/ AUTO DIFF
BASOPHILS ABSOLUTE COUNT: 0 10*9/L (ref 0.0–0.1)
BASOPHILS RELATIVE PERCENT: 0.9 %
EOSINOPHILS ABSOLUTE COUNT: 0.1 10*9/L (ref 0.0–0.5)
EOSINOPHILS RELATIVE PERCENT: 1.6 %
HEMATOCRIT: 41 % (ref 39.0–48.0)
HEMOGLOBIN: 13.8 g/dL (ref 12.9–16.5)
LYMPHOCYTES ABSOLUTE COUNT: 1.9 10*9/L (ref 1.1–3.6)
LYMPHOCYTES RELATIVE PERCENT: 41.3 %
MEAN CORPUSCULAR HEMOGLOBIN CONC: 33.6 g/dL (ref 32.0–36.0)
MEAN CORPUSCULAR HEMOGLOBIN: 28.1 pg (ref 25.9–32.4)
MEAN CORPUSCULAR VOLUME: 83.4 fL (ref 77.6–95.7)
MEAN PLATELET VOLUME: 8.4 fL (ref 6.8–10.7)
MONOCYTES ABSOLUTE COUNT: 0.4 10*9/L (ref 0.3–0.8)
MONOCYTES RELATIVE PERCENT: 8.3 %
NEUTROPHILS ABSOLUTE COUNT: 2.2 10*9/L (ref 1.8–7.8)
NEUTROPHILS RELATIVE PERCENT: 47.9 %
NUCLEATED RED BLOOD CELLS: 0 /100{WBCs} (ref <=4)
PLATELET COUNT: 221 10*9/L (ref 150–450)
RED BLOOD CELL COUNT: 4.91 10*12/L (ref 4.26–5.60)
RED CELL DISTRIBUTION WIDTH: 14.4 % (ref 12.2–15.2)
WBC ADJUSTED: 4.5 10*9/L (ref 3.6–11.2)

## 2021-04-02 LAB — MAGNESIUM: MAGNESIUM: 1.7 mg/dL (ref 1.6–2.6)

## 2021-04-02 LAB — LIPID PANEL
CHOLESTEROL/HDL RATIO SCREEN: 3.4 (ref 1.0–4.5)
CHOLESTEROL: 117 mg/dL (ref <=200)
HDL CHOLESTEROL: 34 mg/dL — ABNORMAL LOW (ref 40–60)
LDL CHOLESTEROL CALCULATED: 50 mg/dL (ref 40–99)
NON-HDL CHOLESTEROL: 83 mg/dL (ref 70–130)
TRIGLYCERIDES: 165 mg/dL — ABNORMAL HIGH (ref 0–150)
VLDL CHOLESTEROL CAL: 33 mg/dL (ref 11–50)

## 2021-04-02 LAB — HEMOGLOBIN A1C
ESTIMATED AVERAGE GLUCOSE: 148 mg/dL
HEMOGLOBIN A1C: 6.8 % — ABNORMAL HIGH (ref 4.8–5.6)

## 2021-04-02 LAB — PARATHYROID HORMONE (PTH): PARATHYROID HORMONE INTACT: 189.7 pg/mL — ABNORMAL HIGH (ref 18.4–80.1)

## 2021-04-02 LAB — TACROLIMUS LEVEL, TROUGH: TACROLIMUS, TROUGH: 9.3 ng/mL (ref 5.0–15.0)

## 2021-04-03 LAB — CMV DNA, QUANTITATIVE, PCR
CMV QUANT: <50 [IU]/mL — ABNORMAL HIGH (ref <0)
CMV VIRAL LD: DETECTED — AB

## 2021-04-05 LAB — BK VIRUS QUANTITATIVE PCR, BLOOD: BK BLOOD RESULT: NOT DETECTED

## 2021-04-05 LAB — VITAMIN D 25 HYDROXY: VITAMIN D, TOTAL (25OH): 32.8 ng/mL (ref 20.0–80.0)

## 2021-04-08 LAB — VITAMIN D 1,25 DIHYDROXY: VITAMIN D 1,25-DIHYDROXY: 44 pg/mL

## 2021-04-09 LAB — HLA DS POST TRANSPLANT
ANTI-DONOR DRW #1 MFI: 160 MFI
ANTI-DONOR DRW #2 MFI: 160 MFI
ANTI-DONOR HLA-A #1 MFI: 80 MFI
ANTI-DONOR HLA-A #2 MFI: 36 MFI
ANTI-DONOR HLA-B #1 MFI: 115 MFI
ANTI-DONOR HLA-B #2 MFI: 109 MFI
ANTI-DONOR HLA-C #1 MFI: 88 MFI
ANTI-DONOR HLA-C #2 MFI: 83 MFI
ANTI-DONOR HLA-DP #2 MFI: 146 MFI
ANTI-DONOR HLA-DQB #1 MFI: 26 MFI
ANTI-DONOR HLA-DQB #2 MFI: 68 MFI
ANTI-DONOR HLA-DR #2 MFI: 40 MFI

## 2021-04-09 LAB — FSAB CLASS 2 ANTIBODY SPECIFICITY: HLA CL2 AB RESULT: NEGATIVE

## 2021-04-09 LAB — FSAB CLASS 1 ANTIBODY SPECIFICITY: HLA CLASS 1 ANTIBODY RESULT: NEGATIVE

## 2021-04-09 NOTE — Unmapped (Signed)
[facility] Clinical Assessment & Refill Coordination Note    Patrick Vazquez [place], [place]: 15-Jun-1975  Phone: [phone] (home) [phone] (work)    All above HIPAA information was verified with patient.     Was a Nurse, learning disability used for this call? No    Specialty Medication(s):   Transplant:  mycophenolic acid 180mg  and tacrolimus 1mg      Current Outpatient Medications   Medication Sig Dispense Refill   ??? acetaminophen (TYLENOL) 325 MG tablet Take 1-2 tablets (325-650 mg total) by mouth every four (4) hours as needed for pain. 100 tablet 0   ??? amLODIPine (NORVASC) 10 MG tablet Take 1 tablet (10 mg total) by mouth daily. 90 tablet 3   ??? aspirin (ECOTRIN) 81 MG tablet Take 1 tablet (81 mg total) by mouth daily. 30 tablet 11   ??? atorvastatin (LIPITOR) 40 MG tablet Take 1 tablet (40 mg total) by mouth every evening. 90 tablet 3   ??? blood sugar diagnostic Strp Test Blood Sugar four times a day; E11.9 120 strip 5   ??? carvediloL (COREG) 25 MG tablet Take 1 tablet (25 mg total) by mouth Two (2) times a day. 180 tablet 3   ??? cholecalciferol, vitamin D3, (VITAMIN D3) 25 mcg (1,000 unit) Chew Chew 1 tablet daily.     ??? insulin lispro (HUMALOG) 100 unit/mL injection pen Inject 8 Units under the skin Three (3) times a day before meals. Dx code: E11.9 Diabetes (Patient taking differently: Inject 8 Units under the skin continuous. Dx code: E11.9 Diabetes) 15 mL 5   ??? insulin syringe-needle U-100 (BD VEO INSULIN SYRINGE UF) 1/2 mL 31 gauge x 15/64 (6 mm) Syrg CHECK BLOOD SUGAR 4 TIMES DAILY. 100 each 11   ??? magnesium oxide (MAG-OX) 400 mg (241.3 mg elemental magnesium) tablet Take 1 tablet (400 mg total) by mouth daily. 30 tablet 11   ??? mycophenolate (MYFORTIC) 180 MG EC tablet Take 2 tablets (360 mg total) by mouth Two (2) times a day. 120 tablet 11   ??? olmesartan (BENICAR) 20 MG tablet Take 1 tablet (20 mg total) by mouth daily. 90 tablet 3   ??? subcutaneous insulin pump Misc by Miscellaneous route continuous.     ??? tacrolimus (PROGRAF) 1 MG capsule Take 3 capsules (3 mg total) by mouth two (2) times a day. 180 capsule 5     No current facility-administered medications for this visit.        Changes to medications: Jaxon reports no changes at this time.    No Known Allergies    Changes to allergies: No    SPECIALTY MEDICATION ADHERENCE     Mycophenolate 180mg   : 10 days of medicine on hand   Tacrolimus 1mg   : 10 days of medicine on hand       Medication Adherence    Patient reported X missed doses in the last month: 0  Specialty Medication: TACROLIMUS 1MG   Patient is on additional specialty medications: Yes  Additional Specialty Medications: MYCOPHENOLATE 180MG   Patient Reported Additional Medication X Missed Doses in the Last Month: 0          Specialty medication(s) dose(s) confirmed: Regimen is correct and unchanged.     Are there any concerns with adherence? No    Adherence counseling provided? Not needed    CLINICAL MANAGEMENT AND INTERVENTION      Clinical Benefit Assessment:    Do you feel the medicine is effective or helping your condition? Yes  Clinical Benefit counseling provided? Not needed    Adverse Effects Assessment:    Are you experiencing any side effects? No    Are you experiencing difficulty administering your medicine? No    Quality of Life Assessment:      How many days over the past month did your transplant  keep you from your normal activities? For example, brushing your teeth or getting up in the morning. 0    Have you discussed this with your provider? Not needed    Acute Infection Status:    Acute infections noted within Epic:  No active infections  Patient reported infection: None    Therapy Appropriateness:    Is therapy appropriate and patient progressing towards therapeutic goals? Yes, therapy is appropriate and should be continued    DISEASE/MEDICATION-SPECIFIC INFORMATION      N/A    PATIENT SPECIFIC NEEDS     - Does the patient have any physical, cognitive, or cultural barriers? No    - Is the patient high risk? Yes, patient is taking a REMS drug. Medication is dispensed in compliance with REMS program    - Does the patient require a Care Management Plan? No     SOCIAL DETERMINANTS OF HEALTH     At the [facility], we have learned that life circumstances - like trouble affording food, housing, utilities, or transportation can affect the health of many of our patients.   That is why we wanted to ask: are you currently experiencing any life circumstances that are negatively impacting your health and/or quality of life? No    Social Determinants of Health     Food Insecurity: Not on file   Tobacco Use: Medium Risk   ??? Smoking Tobacco Use: Former   ??? Smokeless Tobacco Use: Never   ??? Passive Exposure: Not on file   Transportation Needs: Not on file   Alcohol Use: Not on file   Housing/Utilities: Not on file   Substance Use: Not on file   Financial Resource Strain: Not on file   Physical Activity: Not on file   Health Literacy: Not on file   Stress: Not on file   Intimate Partner Violence: Not on file   Depression: Not on file   Social Connections: Not on file       Would you be willing to receive help with any of the needs that you have identified today? Not applicable       SHIPPING     Specialty Medication(s) to be Shipped:   Transplant:  mycophenolic acid 180mg  and tacrolimus 1mg     Other medication(s) to be shipped: No additional medications requested for fill at this time     Changes to insurance: No    Delivery Scheduled: Yes, Expected medication delivery date: 04/16/2021.     Medication will be delivered via UPS to the confirmed prescription address in [facility].    The patient will receive a drug information handout for each medication shipped and additional FDA Medication Guides as required.  Verified that patient has previously received a Conservation officer, historic buildings and a Surveyor, mining.    The patient or caregiver noted above participated in the development of this care plan and knows that they can request review of or adjustments to the care plan at any time.      All of the patient's questions and concerns have been addressed.    Thad Ranger   [facility] Specialty Pharmacist

## 2021-04-15 MED FILL — MYCOPHENOLATE SODIUM 180 MG TABLET,DELAYED RELEASE: ORAL | 90 days supply | Qty: 360 | Fill #1

## 2021-04-15 MED FILL — TACROLIMUS 1 MG CAPSULE, IMMEDIATE-RELEASE: ORAL | 90 days supply | Qty: 540 | Fill #1

## 2021-04-30 ENCOUNTER — Ambulatory Visit: Admit: 2021-04-30 | Discharge: 2021-05-01 | Payer: BLUE CROSS/BLUE SHIELD

## 2021-04-30 DIAGNOSIS — Z79899 Other long term (current) drug therapy: Principal | ICD-10-CM

## 2021-04-30 DIAGNOSIS — Z940 Kidney transplant status: Principal | ICD-10-CM

## 2021-04-30 DIAGNOSIS — E118 Type 2 diabetes mellitus with unspecified complications: Principal | ICD-10-CM

## 2021-04-30 LAB — PROTEIN / CREATININE RATIO, URINE
CREATININE, URINE: 199.6 mg/dL
PROTEIN URINE: 13.3 mg/dL
PROTEIN/CREAT RATIO, URINE: 0.067

## 2021-04-30 LAB — CBC W/ AUTO DIFF
BASOPHILS ABSOLUTE COUNT: 0 10*9/L (ref 0.0–0.1)
BASOPHILS RELATIVE PERCENT: 0.5 %
EOSINOPHILS ABSOLUTE COUNT: 0.2 10*9/L (ref 0.0–0.5)
EOSINOPHILS RELATIVE PERCENT: 2.9 %
HEMATOCRIT: 42.8 % (ref 39.0–48.0)
HEMOGLOBIN: 14.3 g/dL (ref 12.9–16.5)
LYMPHOCYTES ABSOLUTE COUNT: 1.6 10*9/L (ref 1.1–3.6)
LYMPHOCYTES RELATIVE PERCENT: 29.8 %
MEAN CORPUSCULAR HEMOGLOBIN CONC: 33.5 g/dL (ref 32.0–36.0)
MEAN CORPUSCULAR HEMOGLOBIN: 27.9 pg (ref 25.9–32.4)
MEAN CORPUSCULAR VOLUME: 83.3 fL (ref 77.6–95.7)
MEAN PLATELET VOLUME: 8.4 fL (ref 6.8–10.7)
MONOCYTES ABSOLUTE COUNT: 0.5 10*9/L (ref 0.3–0.8)
MONOCYTES RELATIVE PERCENT: 8.6 %
NEUTROPHILS ABSOLUTE COUNT: 3.1 10*9/L (ref 1.8–7.8)
NEUTROPHILS RELATIVE PERCENT: 58.2 %
NUCLEATED RED BLOOD CELLS: 0 /100{WBCs} (ref <=4)
PLATELET COUNT: 251 10*9/L (ref 150–450)
RED BLOOD CELL COUNT: 5.14 10*12/L (ref 4.26–5.60)
RED CELL DISTRIBUTION WIDTH: 14.9 % (ref 12.2–15.2)
WBC ADJUSTED: 5.3 10*9/L (ref 3.6–11.2)

## 2021-04-30 LAB — COMPREHENSIVE METABOLIC PANEL
ALBUMIN: 3.7 g/dL (ref 3.4–5.0)
ALKALINE PHOSPHATASE: 98 U/L (ref 46–116)
ALT (SGPT): 51 U/L — ABNORMAL HIGH (ref 10–49)
ANION GAP: 10 mmol/L (ref 5–14)
AST (SGOT): 41 U/L — ABNORMAL HIGH (ref <=34)
BILIRUBIN TOTAL: 0.4 mg/dL (ref 0.3–1.2)
BLOOD UREA NITROGEN: 25 mg/dL — ABNORMAL HIGH (ref 9–23)
BUN / CREAT RATIO: 16
CALCIUM: 9.5 mg/dL (ref 8.7–10.4)
CHLORIDE: 109 mmol/L — ABNORMAL HIGH (ref 98–107)
CO2: 20.8 mmol/L (ref 20.0–31.0)
CREATININE: 1.56 mg/dL — ABNORMAL HIGH
EGFR CKD-EPI (2021) MALE: 55 mL/min/{1.73_m2} — ABNORMAL LOW (ref >=60)
GLUCOSE RANDOM: 121 mg/dL (ref 70–179)
POTASSIUM: 4.3 mmol/L (ref 3.4–4.8)
PROTEIN TOTAL: 8 g/dL (ref 5.7–8.2)
SODIUM: 140 mmol/L (ref 135–145)

## 2021-04-30 LAB — PARATHYROID HORMONE (PTH): PARATHYROID HORMONE INTACT: 125.1 pg/mL — ABNORMAL HIGH (ref 18.4–80.1)

## 2021-04-30 LAB — URINALYSIS
BACTERIA: NONE SEEN /HPF
BILIRUBIN UA: NEGATIVE
BLOOD UA: NEGATIVE
GLUCOSE UA: NEGATIVE
KETONES UA: NEGATIVE
LEUKOCYTE ESTERASE UA: NEGATIVE
NITRITE UA: NEGATIVE
PH UA: 5.5 (ref 5.0–9.0)
PROTEIN UA: NEGATIVE
RBC UA: 1 /HPF (ref <3)
SPECIFIC GRAVITY UA: 1.02 (ref 1.005–1.030)
SQUAMOUS EPITHELIAL: 1 /HPF (ref 0–5)
UROBILINOGEN UA: 0.2
WBC UA: 2 /HPF — ABNORMAL HIGH (ref <2)

## 2021-04-30 LAB — MAGNESIUM: MAGNESIUM: 1.6 mg/dL (ref 1.6–2.6)

## 2021-04-30 LAB — HEMOGLOBIN A1C
ESTIMATED AVERAGE GLUCOSE: 137 mg/dL
HEMOGLOBIN A1C: 6.4 % — ABNORMAL HIGH (ref 4.8–5.6)

## 2021-04-30 LAB — PHOSPHORUS: PHOSPHORUS: 2.8 mg/dL (ref 2.4–5.1)

## 2021-05-01 LAB — TACROLIMUS LEVEL, TROUGH: TACROLIMUS, TROUGH: 4.6 ng/mL — ABNORMAL LOW (ref 5.0–15.0)

## 2021-05-02 LAB — CMV DNA, QUANTITATIVE, PCR: CMV VIRAL LD: NOT DETECTED

## 2021-05-03 LAB — BK VIRUS QUANTITATIVE PCR, BLOOD: BK BLOOD RESULT: NOT DETECTED

## 2021-05-04 LAB — VITAMIN D 25 HYDROXY: VITAMIN D, TOTAL (25OH): 32.4 ng/mL (ref 20.0–80.0)

## 2021-05-05 LAB — VITAMIN D 1,25 DIHYDROXY: VITAMIN D 1,25-DIHYDROXY: 35 pg/mL

## 2021-05-06 NOTE — Unmapped (Signed)
Tacrolimus level 4.3 is not a real trough.

## 2021-07-02 DIAGNOSIS — Z940 Kidney transplant status: Principal | ICD-10-CM

## 2021-07-02 MED ORDER — TACROLIMUS 1 MG CAPSULE, IMMEDIATE-RELEASE
ORAL_CAPSULE | Freq: Two times a day (BID) | ORAL | 5 refills | 30 days | Status: CP
Start: 2021-07-02 — End: 2021-12-29
  Filled 2021-07-08: qty 180, 30d supply, fill #0

## 2021-07-02 NOTE — Unmapped (Signed)
[facility] Clinical Assessment & Refill Coordination Note    Patrick Vazquez [place], [place]: 23-Apr-1975  Phone: [phone] (home) [phone] (work)    All above HIPAA information was verified with patient.     Was a Nurse, learning disability used for this call? No    Specialty Medication(s):   Transplant:  mycophenolic acid 180mg  and tacrolimus 1mg      Current Outpatient Medications   Medication Sig Dispense Refill    acetaminophen (TYLENOL) 325 MG tablet Take 1-2 tablets (325-650 mg total) by mouth every four (4) hours as needed for pain. 100 tablet 0    amLODIPine (NORVASC) 10 MG tablet Take 1 tablet (10 mg total) by mouth daily. 90 tablet 3    aspirin (ECOTRIN) 81 MG tablet Take 1 tablet (81 mg total) by mouth daily. 30 tablet 11    atorvastatin (LIPITOR) 40 MG tablet Take 1 tablet (40 mg total) by mouth every evening. 90 tablet 3    blood sugar diagnostic Strp Test Blood Sugar four times a day; E11.9 120 strip 5    carvediloL (COREG) 25 MG tablet Take 1 tablet (25 mg total) by mouth Two (2) times a day. 180 tablet 3    cholecalciferol, vitamin D3, (VITAMIN D3) 25 mcg (1,000 unit) Chew Chew 1 tablet daily.      insulin lispro (HUMALOG) 100 unit/mL injection pen Inject 8 Units under the skin Three (3) times a day before meals. Dx code: E11.9 Diabetes (Patient taking differently: Inject 8 Units under the skin continuous. Dx code: E11.9 Diabetes) 15 mL 5    insulin syringe-needle U-100 (BD VEO INSULIN SYRINGE UF) 1/2 mL 31 gauge x 15/64 (6 mm) Syrg CHECK BLOOD SUGAR 4 TIMES DAILY. 100 each 11    mycophenolate (MYFORTIC) 180 MG EC tablet Take 2 tablets (360 mg total) by mouth Two (2) times a day. 120 tablet 11    olmesartan (BENICAR) 20 MG tablet Take 1 tablet (20 mg total) by mouth daily. 90 tablet 3    subcutaneous insulin pump Misc by Miscellaneous route continuous.      tacrolimus (PROGRAF) 1 MG capsule Take 3 capsules (3 mg total) by mouth two (2) times a day. 180 capsule 5     No current facility-administered medications for this visit.        Changes to medications: Brentley reports no changes at this time.    No Known Allergies    Changes to allergies: No    SPECIALTY MEDICATION ADHERENCE     Tacrolimus 1 mg: 10 days of medicine on hand   Mycophenolate 180 mg: 10 days of medicine on hand     Medication Adherence    Patient reported X missed doses in the last month: 0  Specialty Medication: Mycophenolate 180mg   Patient is on additional specialty medications: Yes  Additional Specialty Medications: Tacrolimus 1mg   Patient Reported Additional Medication X Missed Doses in the Last Month: 0  Patient is on more than two specialty medications: No          Specialty medication(s) dose(s) confirmed: Regimen is correct and unchanged.     Are there any concerns with adherence? No    Adherence counseling provided? Not needed    CLINICAL MANAGEMENT AND INTERVENTION      Clinical Benefit Assessment:    Do you feel the medicine is effective or helping your condition? Yes    Clinical Benefit counseling provided? Not needed    Adverse Effects Assessment:    Are you  experiencing any side effects? No    Are you experiencing difficulty administering your medicine? No    Quality of Life Assessment:           How many days over the past month did your kidney transplant  keep you from your normal activities? For example, brushing your teeth or getting up in the morning. 0    Have you discussed this with your provider? Not needed    Acute Infection Status:    Acute infections noted within Epic:  No active infections  Patient reported infection: None    Therapy Appropriateness:    Is therapy appropriate and patient progressing towards therapeutic goals? Yes, therapy is appropriate and should be continued    DISEASE/MEDICATION-SPECIFIC INFORMATION      N/A    PATIENT SPECIFIC NEEDS     Does the patient have any physical, cognitive, or cultural barriers? No    Is the patient high risk? Yes, patient is taking a REMS drug. Medication is dispensed in compliance with REMS program    Does the patient require a Care Management Plan? No     SHIPPING     Specialty Medication(s) to be Shipped:   Transplant:  mycophenolic acid 180mg  and tacrolimus 1mg     Other medication(s) to be shipped: No additional medications requested for fill at this time     Changes to insurance: No    Delivery Scheduled: Yes, Expected medication delivery date: 07/09/21.       Medication will be delivered via UPS to the confirmed prescription address in [facility]    The patient will receive a drug information handout for each medication shipped and additional FDA Medication Guides as required.  Verified that patient has previously received a Conservation officer, historic buildings and a Surveyor, mining.    The patient or caregiver noted above participated in the development of this care plan and knows that they can request review of or adjustments to the care plan at any time.      All of the patient's questions and concerns have been addressed.    Tera Helper   [facility] Specialty Pharmacist

## 2021-07-08 MED FILL — MYCOPHENOLATE SODIUM 180 MG TABLET,DELAYED RELEASE: ORAL | 90 days supply | Qty: 360 | Fill #2

## 2021-07-09 ENCOUNTER — Ambulatory Visit: Admit: 2021-07-09 | Discharge: 2021-07-10 | Payer: BLUE CROSS/BLUE SHIELD

## 2021-07-09 DIAGNOSIS — E118 Type 2 diabetes mellitus with unspecified complications: Principal | ICD-10-CM

## 2021-07-09 DIAGNOSIS — Z940 Kidney transplant status: Principal | ICD-10-CM

## 2021-07-09 DIAGNOSIS — Z79899 Other long term (current) drug therapy: Principal | ICD-10-CM

## 2021-07-09 LAB — LIPID PANEL
CHOLESTEROL/HDL RATIO SCREEN: 3.8 (ref 1.0–4.5)
CHOLESTEROL: 118 mg/dL (ref <=200)
HDL CHOLESTEROL: 31 mg/dL — ABNORMAL LOW (ref 40–60)
LDL CHOLESTEROL CALCULATED: 49 mg/dL (ref 40–99)
NON-HDL CHOLESTEROL: 87 mg/dL (ref 70–130)
TRIGLYCERIDES: 192 mg/dL — ABNORMAL HIGH (ref 0–150)
VLDL CHOLESTEROL CAL: 38.4 mg/dL (ref 11–50)

## 2021-07-09 LAB — COMPREHENSIVE METABOLIC PANEL
ALBUMIN: 3.6 g/dL (ref 3.4–5.0)
ALKALINE PHOSPHATASE: 131 U/L — ABNORMAL HIGH (ref 46–116)
ALT (SGPT): 70 U/L — ABNORMAL HIGH (ref 10–49)
ANION GAP: 10 mmol/L (ref 5–14)
AST (SGOT): 60 U/L — ABNORMAL HIGH (ref <=34)
BILIRUBIN TOTAL: 0.4 mg/dL (ref 0.3–1.2)
BLOOD UREA NITROGEN: 23 mg/dL (ref 9–23)
BUN / CREAT RATIO: 16
CALCIUM: 9.1 mg/dL (ref 8.7–10.4)
CHLORIDE: 110 mmol/L — ABNORMAL HIGH (ref 98–107)
CO2: 18.1 mmol/L — ABNORMAL LOW (ref 20.0–31.0)
CREATININE: 1.41 mg/dL — ABNORMAL HIGH
EGFR CKD-EPI (2021) MALE: 62 mL/min/{1.73_m2} (ref >=60)
GLUCOSE RANDOM: 179 mg/dL (ref 70–179)
POTASSIUM: 4.4 mmol/L (ref 3.4–4.8)
PROTEIN TOTAL: 7.4 g/dL (ref 5.7–8.2)
SODIUM: 138 mmol/L (ref 135–145)

## 2021-07-09 LAB — CBC W/ AUTO DIFF
BASOPHILS ABSOLUTE COUNT: 0 10*9/L (ref 0.0–0.1)
BASOPHILS RELATIVE PERCENT: 1 %
EOSINOPHILS ABSOLUTE COUNT: 0.1 10*9/L (ref 0.0–0.5)
EOSINOPHILS RELATIVE PERCENT: 2.4 %
HEMATOCRIT: 40.3 % (ref 39.0–48.0)
HEMOGLOBIN: 13.5 g/dL (ref 12.9–16.5)
LYMPHOCYTES ABSOLUTE COUNT: 2 10*9/L (ref 1.1–3.6)
LYMPHOCYTES RELATIVE PERCENT: 42.3 %
MEAN CORPUSCULAR HEMOGLOBIN CONC: 33.4 g/dL (ref 32.0–36.0)
MEAN CORPUSCULAR HEMOGLOBIN: 28.4 pg (ref 25.9–32.4)
MEAN CORPUSCULAR VOLUME: 85.1 fL (ref 77.6–95.7)
MEAN PLATELET VOLUME: 8.4 fL (ref 6.8–10.7)
MONOCYTES ABSOLUTE COUNT: 0.4 10*9/L (ref 0.3–0.8)
MONOCYTES RELATIVE PERCENT: 8.8 %
NEUTROPHILS ABSOLUTE COUNT: 2.2 10*9/L (ref 1.8–7.8)
NEUTROPHILS RELATIVE PERCENT: 45.5 %
NUCLEATED RED BLOOD CELLS: 0 /100{WBCs} (ref <=4)
PLATELET COUNT: 231 10*9/L (ref 150–450)
RED BLOOD CELL COUNT: 4.74 10*12/L (ref 4.26–5.60)
RED CELL DISTRIBUTION WIDTH: 14.9 % (ref 12.2–15.2)
WBC ADJUSTED: 4.8 10*9/L (ref 3.6–11.2)

## 2021-07-09 LAB — URINALYSIS
BACTERIA: NONE SEEN /HPF
BILIRUBIN UA: NEGATIVE
BLOOD UA: NEGATIVE
GLUCOSE UA: NEGATIVE
KETONES UA: NEGATIVE
LEUKOCYTE ESTERASE UA: NEGATIVE
NITRITE UA: NEGATIVE
PH UA: 5.5 (ref 5.0–9.0)
PROTEIN UA: NEGATIVE
RBC UA: <1 /HPF (ref <3)
SPECIFIC GRAVITY UA: 1.02 (ref 1.005–1.030)
SQUAMOUS EPITHELIAL: <1 /HPF (ref 0–5)
UROBILINOGEN UA: 0.2
WBC UA: <1 /HPF (ref <2)

## 2021-07-09 LAB — MAGNESIUM: MAGNESIUM: 1.5 mg/dL — ABNORMAL LOW (ref 1.6–2.6)

## 2021-07-09 LAB — PROTEIN / CREATININE RATIO, URINE
CREATININE, URINE: 120.8 mg/dL
PROTEIN URINE: 19.1 mg/dL
PROTEIN/CREAT RATIO, URINE: 0.158

## 2021-07-09 LAB — PARATHYROID HORMONE (PTH): PARATHYROID HORMONE INTACT: 313.3 pg/mL — ABNORMAL HIGH (ref 18.4–80.1)

## 2021-07-09 LAB — HEMOGLOBIN A1C
ESTIMATED AVERAGE GLUCOSE: 140 mg/dL
HEMOGLOBIN A1C: 6.5 % — ABNORMAL HIGH (ref 4.8–5.6)

## 2021-07-09 LAB — PHOSPHORUS: PHOSPHORUS: 3.1 mg/dL (ref 2.4–5.1)

## 2021-07-10 LAB — CMV DNA, QUANTITATIVE, PCR
CMV QUANT: <50 [IU]/mL — ABNORMAL HIGH (ref <0)
CMV VIRAL LD: DETECTED — AB

## 2021-07-10 LAB — TACROLIMUS LEVEL, TROUGH: TACROLIMUS, TROUGH: 5.3 ng/mL (ref 5.0–15.0)

## 2021-07-11 LAB — VITAMIN D 25 HYDROXY: VITAMIN D, TOTAL (25OH): 27.7 ng/mL (ref 20.0–80.0)

## 2021-07-12 LAB — BK VIRUS QUANTITATIVE PCR, BLOOD: BK BLOOD RESULT: NOT DETECTED

## 2021-07-15 LAB — VITAMIN D 1,25 DIHYDROXY: VITAMIN D 1,25-DIHYDROXY: 40 pg/mL

## 2021-07-31 ENCOUNTER — Ambulatory Visit: Admit: 2021-07-31 | Discharge: 2021-08-01 | Payer: BLUE CROSS/BLUE SHIELD

## 2021-07-31 DIAGNOSIS — Z940 Kidney transplant status: Principal | ICD-10-CM

## 2021-07-31 DIAGNOSIS — Z79899 Other long term (current) drug therapy: Principal | ICD-10-CM

## 2021-07-31 DIAGNOSIS — E118 Type 2 diabetes mellitus with unspecified complications: Principal | ICD-10-CM

## 2021-07-31 LAB — MAGNESIUM: MAGNESIUM: 1.6 mg/dL (ref 1.6–2.6)

## 2021-07-31 LAB — LIPID PANEL
CHOLESTEROL/HDL RATIO SCREEN: 3.8 (ref 1.0–4.5)
CHOLESTEROL: 125 mg/dL (ref <=200)
HDL CHOLESTEROL: 33 mg/dL — ABNORMAL LOW (ref 40–60)
LDL CHOLESTEROL CALCULATED: 60 mg/dL (ref 40–99)
NON-HDL CHOLESTEROL: 92 mg/dL (ref 70–130)
TRIGLYCERIDES: 158 mg/dL — ABNORMAL HIGH (ref 0–150)
VLDL CHOLESTEROL CAL: 31.6 mg/dL (ref 11–50)

## 2021-07-31 LAB — URINALYSIS
BILIRUBIN UA: NEGATIVE
BLOOD UA: NEGATIVE
GLUCOSE UA: 250 — AB
KETONES UA: NEGATIVE
LEUKOCYTE ESTERASE UA: NEGATIVE
NITRITE UA: NEGATIVE
PH UA: 5.5 (ref 5.0–9.0)
PROTEIN UA: NEGATIVE
RBC UA: <1 /HPF (ref <3)
SPECIFIC GRAVITY UA: 1.02 (ref 1.005–1.030)
SQUAMOUS EPITHELIAL: <1 /HPF (ref 0–5)
UROBILINOGEN UA: 0.2
WBC UA: <1 /HPF (ref <2)

## 2021-07-31 LAB — HEMOGLOBIN A1C
ESTIMATED AVERAGE GLUCOSE: 151 mg/dL
HEMOGLOBIN A1C: 6.9 % — ABNORMAL HIGH (ref 4.8–5.6)

## 2021-07-31 LAB — CBC W/ AUTO DIFF
BASOPHILS ABSOLUTE COUNT: 0 10*9/L (ref 0.0–0.1)
BASOPHILS RELATIVE PERCENT: 0.6 %
EOSINOPHILS ABSOLUTE COUNT: 0.1 10*9/L (ref 0.0–0.5)
EOSINOPHILS RELATIVE PERCENT: 1.3 %
HEMATOCRIT: 41.4 % (ref 39.0–48.0)
HEMOGLOBIN: 13.8 g/dL (ref 12.9–16.5)
LYMPHOCYTES ABSOLUTE COUNT: 2.2 10*9/L (ref 1.1–3.6)
LYMPHOCYTES RELATIVE PERCENT: 37 %
MEAN CORPUSCULAR HEMOGLOBIN CONC: 33.4 g/dL (ref 32.0–36.0)
MEAN CORPUSCULAR HEMOGLOBIN: 28.1 pg (ref 25.9–32.4)
MEAN CORPUSCULAR VOLUME: 84 fL (ref 77.6–95.7)
MEAN PLATELET VOLUME: 8.5 fL (ref 6.8–10.7)
MONOCYTES ABSOLUTE COUNT: 0.5 10*9/L (ref 0.3–0.8)
MONOCYTES RELATIVE PERCENT: 8.7 %
NEUTROPHILS ABSOLUTE COUNT: 3.1 10*9/L (ref 1.8–7.8)
NEUTROPHILS RELATIVE PERCENT: 52.4 %
NUCLEATED RED BLOOD CELLS: 0 /100{WBCs} (ref <=4)
PLATELET COUNT: 243 10*9/L (ref 150–450)
RED BLOOD CELL COUNT: 4.92 10*12/L (ref 4.26–5.60)
RED CELL DISTRIBUTION WIDTH: 15 % (ref 12.2–15.2)
WBC ADJUSTED: 5.8 10*9/L (ref 3.6–11.2)

## 2021-07-31 LAB — COMPREHENSIVE METABOLIC PANEL
ALBUMIN: 4 g/dL (ref 3.4–5.0)
ALKALINE PHOSPHATASE: 106 U/L (ref 46–116)
ALT (SGPT): 51 U/L — ABNORMAL HIGH (ref 10–49)
ANION GAP: 10 mmol/L (ref 5–14)
AST (SGOT): 42 U/L — ABNORMAL HIGH (ref <=34)
BILIRUBIN TOTAL: 0.6 mg/dL (ref 0.3–1.2)
BLOOD UREA NITROGEN: 22 mg/dL (ref 9–23)
BUN / CREAT RATIO: 14
CALCIUM: 9.7 mg/dL (ref 8.7–10.4)
CHLORIDE: 110 mmol/L — ABNORMAL HIGH (ref 98–107)
CO2: 16.8 mmol/L — ABNORMAL LOW (ref 20.0–31.0)
CREATININE: 1.52 mg/dL — ABNORMAL HIGH
EGFR CKD-EPI (2021) MALE: 57 mL/min/{1.73_m2} — ABNORMAL LOW (ref >=60)
GLUCOSE RANDOM: 144 mg/dL (ref 70–179)
POTASSIUM: 4.2 mmol/L (ref 3.4–4.8)
PROTEIN TOTAL: 7.8 g/dL (ref 5.7–8.2)
SODIUM: 137 mmol/L (ref 135–145)

## 2021-07-31 LAB — PARATHYROID HORMONE (PTH): PARATHYROID HORMONE INTACT: 99.6 pg/mL — ABNORMAL HIGH (ref 18.4–80.1)

## 2021-07-31 LAB — PROTEIN / CREATININE RATIO, URINE
CREATININE, URINE: 162.4 mg/dL
PROTEIN URINE: 15.4 mg/dL
PROTEIN/CREAT RATIO, URINE: 0.095

## 2021-07-31 LAB — PHOSPHORUS: PHOSPHORUS: 1.9 mg/dL — ABNORMAL LOW (ref 2.4–5.1)

## 2021-07-31 LAB — CMV DNA, QUANTITATIVE, PCR
CMV QUANT LOG10: 1.96 {Log_IU}/mL — ABNORMAL HIGH (ref <0.00)
CMV QUANT: 92 [IU]/mL — ABNORMAL HIGH (ref <0)
CMV VIRAL LD: DETECTED — AB

## 2021-07-31 MED ORDER — ATORVASTATIN 40 MG TABLET
ORAL_TABLET | Freq: Every evening | ORAL | 3 refills | 90 days | Status: CP
Start: 2021-07-31 — End: 2022-07-31

## 2021-07-31 MED ORDER — OMEPRAZOLE 40 MG CAPSULE,DELAYED RELEASE
ORAL_CAPSULE | Freq: Every day | ORAL | 3 refills | 90 days | Status: CP
Start: 2021-07-31 — End: 2022-07-31
  Filled 2021-10-22: qty 90, 90d supply, fill #0

## 2021-07-31 MED ORDER — OLMESARTAN 20 MG TABLET
ORAL_TABLET | Freq: Every day | ORAL | 3 refills | 90 days | Status: CP
Start: 2021-07-31 — End: 2022-07-31
  Filled 2021-07-31: qty 90, 90d supply, fill #0

## 2021-07-31 MED ORDER — CARVEDILOL 25 MG TABLET
ORAL_TABLET | Freq: Two times a day (BID) | ORAL | 3 refills | 90 days | Status: CP
Start: 2021-07-31 — End: 2022-07-31

## 2021-07-31 NOTE — Unmapped (Signed)
[facility]   CHRONIC TRANSPLANT FOLLOW UP      PCP: [facility]      Date of Visit at Transplant clinic: 07/31/2021     Graft Status: Stable.     Assessment/Recommendations: Mr. Patrick Vazquez 46 y.o. 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     1) s/p Kidney txp - 01/28/16 secondary to DM - 1  Creatinine - value 1.52 mg/dL. Baseline 1.4 - 1.7   Urine analysis: negative protein/ < 1 WBC/ < 1 RBC   Urine protein/creatinine ratio: 0.095 - 07/31/21  DSA: negative - 04/02/21    Last CMV checked: 07/31/21 - 92, repeat pending.  Last BKV checked: not detected - 07/09/21    Last RUS showed no masses.     2) Immunosuppression: Mycophenolate 360 mg BID / Prograf 3/3 mg   - Prograf level 6.1, last dose taken at 9:00 pm.  - Goal Prograf trough: 5-7.  - Changes in Immunosuppression - none today  - Medications side effects: Yes - leukopenia.  -Will increase Myfortic to 540 mg BID once WBC stable (>4) and C<V is negative.     3) HTN - 122/91 in clinic today (likely d/t weight gain)  Goal for B.P - <130/80  Continue olmesartan 20 mg daily in the PM.    4) Anemia - stable  Goal for Hemoglobin > 11.0      5) MBD - Calcium/Phosphorus - WNL.  PTH 67  - will monitor     6) Electrolytes: WNL.  Stable on mag oxide 400 mg BID.  Start sodium bicarbonate BID.     7) Immunizations:  Influenza (inactivated only): Today in clinic 02/27/21  Pneumococcal vaccination (inactivated only): per pt completed at dialysis center; unable to find records in Epic  Covid: Moderna 04/27/19, 06/01/19, 11/01/19, 06/27/20. Discussed that he is able to receive the newest bivalent vaccine locally  Shingrix: eligible once turns 49    8) Cancer screening:   - Now eligible for colonoscopy; patient informed   - Annual cxr on 11/30/20 showed clear lungs     9) D.M - Uncontrolled in past.  Continues to follow up with endocrinology.   Using insulin injections Humalog with meals.         Follow up: 6 months.     History of Presenting Illness: Since patient's last visit in the transplant clinic - patient has been doing well in terms of transplant, taking transplant medications regularly, no episodes of rejection and no side effects of medications.    Today he presents feeling overall well. Denies chest pain, nausea. No recent hospitalization or use of antibiotics.  He has gained weight and will work on the weight loss.                  Last dose of Prograf at 8:00 p.m.              Diabetes: Yes, type 1.                          Duration: since age 62.                           Insulin Dependent: Yes, using pens.                            Controlled: Yes  Endocrinologist: Dr. [name] at [place].               HTN: Yes, diagnosed in 2014.                          Controlled:Yes (typicaly 130s-140s/60-70s)              CAD/Heartfailure:No               Last Echo/Ejection fraction: TTE Left ventricular hypertrophy - moderate                          Normal left ventricular systolic function, ejection fraction > 55%              Adherence                            With Medication: yes.                          With Follow up: yes.                           Functional Status: Independent.      H/o Stroke: 10/2013 due to uncontrolled BP readings. He had weakness of the left lower extremity and was admitted to [facility]. He has recovered from stroke and only limitation has been not able to run. He never required rehab after the stroke.     Review of Systems:      Fever or chills: negative   Sore throat: negative   Fatigue/malaise: negative   Weight loss or gain: +weight gain   New skin rash/lump or bump: negative   Problems with teeth/gums: negative   Chest pain: negative   Cough or shortness of breath: negative   Swelling: negative   Abdominal pain/heartburn/nausea/vomiting or diarrhea: negative   Pain or bleeding when urinating: negative   Twitching/numbness or weakness: negative      Physical Exam:   BP 122/91  - Pulse 104  - Temp 35.8 ??C (96.5 ??F) (Temporal)  - Ht 182.9 cm (6')  - Wt (!) 143.2 kg (315 lb 12.8 oz)  - BMI 42.83 kg/m??     Nursing note and vitals reviewed.   Constitutional: Oriented to person, place, and time. Appears well-developed and well-nourished. No distress.   HENT: Wearing mask   Head: Normocephalic and atraumatic.   Eyes: Right eye exhibits no discharge. Left eye exhibits no discharge. No scleral icterus.   Neck: Normal range of motion. Neck supple.   Cardiovascular: Normal rate and regular rhythm. Exam reveals no gallop and no friction rub. No murmur heard.   Pulmonary/Chest: Effort normal and breath sounds normal. No respiratory distress.   Abdominal: Soft. Non tender  Musculoskeletal: Normal range of motion. No edema and no tenderness.   Neurological: Alert and oriented to person, place, and time.   Skin: Skin is warm and dry. No rash noted. Not diaphoretic. No erythema. No pallor.   Psychiatric: Normal mood and affect. Behavior is normal. Judgment and thought content normal.     Renal Transplant History:               Race:  AA              Age of recipient (at time of transplant): 80  Cause of kidney disease: D.M - type 1.              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                          - Donor creatinine: 0.85.                           - Infection in donor:                                      Blood culture(s) - 1 of 2 cultures are positive for coag neg staph aureus                          - Ischemia time: 18 hours 15 minutes.                          - Crossmatch: Negative.                          - Donor kidney biopsy:Zero-hour implantation graft biopsy:                                      - Moderate to severe arteriosclerosis observed in some of the vascular cross sections.                                       - Mild acute tubular injury.               Induction: Alemtuzumab              Maintenance IS at the time of transplant: Myfortic/Prograf              DGF: Yes      Allergies: No Known Allergies      Current Medications:     Current Outpatient Medications   Medication Sig Dispense Refill    acetaminophen (TYLENOL) 325 MG tablet Take 1-2 tablets (325-650 mg total) by mouth every four (4) hours as needed for pain. 100 tablet 0    amLODIPine (NORVASC) 10 MG tablet Take 1 tablet (10 mg total) by mouth daily. 90 tablet 3    aspirin (ECOTRIN) 81 MG tablet Take 1 tablet (81 mg total) by mouth daily. 30 tablet 11    atorvastatin (LIPITOR) 40 MG tablet Take 1 tablet (40 mg total) by mouth every evening. 90 tablet 3    blood sugar diagnostic Strp Test Blood Sugar four times a day; E11.9 120 strip 5    carvediloL (COREG) 25 MG tablet Take 1 tablet (25 mg total) by mouth Two (2) times a day. 180 tablet 3    cholecalciferol, vitamin D3, (VITAMIN D3) 25 mcg (1,000 unit) Chew Chew 1 tablet  daily.      insulin lispro (HUMALOG) 100 unit/mL injection pen Inject 8 Units under the skin Three (3) times a day before meals. Dx code: E11.9 Diabetes (Patient taking differently: Inject 8 Units under the skin continuous. Dx code: E11.9 Diabetes) 15 mL 5    insulin syringe-needle U-100 (BD VEO INSULIN SYRINGE UF) 1/2 mL 31 gauge x 15/64 (6 mm) Syrg CHECK BLOOD SUGAR 4 TIMES DAILY. 100 each 11    mycophenolate (MYFORTIC) 180 MG EC tablet Take 2 tablets (360 mg total) by mouth Two (2) times a day. 120 tablet 11    olmesartan (BENICAR) 20 MG tablet Take 1 tablet (20 mg total) by mouth daily. 90 tablet 3    subcutaneous insulin pump Misc by Miscellaneous route continuous.      tacrolimus (PROGRAF) 1 MG capsule Take 3 capsules (3 mg total) by mouth two (2) times a day. 180 capsule 5     No current facility-administered medications for this visit.     Past Medical History:   Past Medical History:   Diagnosis Date    CVA (cerebral vascular accident) (CMS-HCC) 10/2013    Diabetes mellitus (CMS-HCC) Type 1    ESRD (end stage renal disease) (CMS-HCC)     HTN (hypertension)      Laboratory studies:   Recent Results (from the past 170 hour(s))   CBC w/ Differential    Collection Time: 07/31/21 11:20 AM   Result Value Ref Range    WBC 5.8 3.6 - 11.2 10*9/L    RBC 4.92 4.26 - 5.60 10*12/L    HGB 13.8 12.9 - 16.5 g/dL    HCT 16.1 09.6 - 04.5 %    MCV 84.0 77.6 - 95.7 fL    MCH 28.1 25.9 - 32.4 pg    MCHC 33.4 32.0 - 36.0 g/dL    RDW 40.9 81.1 - 91.4 %    MPV 8.5 6.8 - 10.7 fL    Platelet 243 150 - 450 10*9/L    nRBC 0 <=4 /100 WBCs    Neutrophils % 52.4 %    Lymphocytes % 37.0 %    Monocytes % 8.7 %    Eosinophils % 1.3 %    Basophils % 0.6 %    Absolute Neutrophils 3.1 1.8 - 7.8 10*9/L    Absolute Lymphocytes 2.2 1.1 - 3.6 10*9/L    Absolute Monocytes 0.5 0.3 - 0.8 10*9/L    Absolute Eosinophils 0.1 0.0 - 0.5 10*9/L    Absolute Basophils 0.0 0.0 - 0.1 10*9/L       Leeroy Bock, MD

## 2021-08-01 DIAGNOSIS — Z940 Kidney transplant status: Principal | ICD-10-CM

## 2021-08-01 LAB — VITAMIN D 25 HYDROXY: VITAMIN D, TOTAL (25OH): 31 ng/mL (ref 20.0–80.0)

## 2021-08-01 LAB — TACROLIMUS LEVEL, TROUGH: TACROLIMUS, TROUGH: 6.1 ng/mL (ref 5.0–15.0)

## 2021-08-01 MED ORDER — TACROLIMUS 1 MG CAPSULE, IMMEDIATE-RELEASE
ORAL_CAPSULE | Freq: Two times a day (BID) | ORAL | 5 refills | 30 days | Status: CP
Start: 2021-08-01 — End: 2022-01-28

## 2021-08-01 NOTE — Unmapped (Signed)
Patient has plenty of tacrolimus on hand.  Asked for a 90 day supply on next refill. Requested new script.

## 2021-08-03 LAB — BK VIRUS QUANTITATIVE PCR, BLOOD: BK BLOOD RESULT: NOT DETECTED

## 2021-08-06 LAB — VITAMIN D 1,25 DIHYDROXY: VITAMIN D 1,25-DIHYDROXY: 39 pg/mL

## 2021-08-09 DIAGNOSIS — Z940 Kidney transplant status: Principal | ICD-10-CM

## 2021-08-12 DIAGNOSIS — Z940 Kidney transplant status: Principal | ICD-10-CM

## 2021-08-12 MED ORDER — MYCOPHENOLATE SODIUM 180 MG TABLET,DELAYED RELEASE
ORAL_TABLET | Freq: Two times a day (BID) | ORAL | 3 refills | 90 days | Status: CP
Start: 2021-08-12 — End: 2022-08-12
  Filled 2021-10-01: qty 360, 90d supply, fill #0

## 2021-08-12 MED ORDER — TACROLIMUS 1 MG CAPSULE, IMMEDIATE-RELEASE
ORAL_CAPSULE | Freq: Two times a day (BID) | ORAL | 3 refills | 90 days | Status: CP
Start: 2021-08-12 — End: 2022-08-12
  Filled 2021-08-14: qty 540, 90d supply, fill #0

## 2021-08-12 NOTE — Unmapped (Signed)
[facility] Refill Coordination Note    Specialty Medication(s) to be Shipped:   Transplant: tacrolimus 1mg     Other medication(s) to be shipped: No additional medications requested for fill at this time     Patrick Vazquez, DOB: [birth year]  Phone: [phone] (home) [phone] (work)      All above HIPAA information was verified with patient.     Was a Nurse, learning disability used for this call? No    Completed refill call assessment today to schedule patient's medication shipment from the [facility] [phone]).  All relevant notes have been reviewed.     Specialty medication(s) and dose(s) confirmed: Regimen is correct and unchanged.   Changes to medications: Patrick Vazquez reports no changes at this time.  Changes to insurance: No  New side effects reported not previously addressed with a pharmacist or physician: None reported  Questions for the pharmacist: No    Confirmed patient received a Conservation officer, historic buildings and a Surveyor, mining with first shipment. The patient will receive a drug information handout for each medication shipped and additional FDA Medication Guides as required.       DISEASE/MEDICATION-SPECIFIC INFORMATION        N/A    SPECIALTY MEDICATION ADHERENCE     Medication Adherence    Patient reported X missed doses in the last month: 0  Specialty Medication: tacrolimus 1 mg  Patient is on additional specialty medications: No              Were doses missed due to medication being on hold? No    tacrolimus 1 mg: 5 days of medicine on hand     REFERRAL TO PHARMACIST     Referral to the pharmacist: Not needed      [facility]     Shipping address confirmed in Epic.     Delivery Scheduled: Yes, Expected medication delivery date: 08/15/21.     Medication will be delivered via UPS to the prescription address in Epic [facility].    Patrick Vazquez   [facility] Specialty Technician

## 2021-09-13 NOTE — Unmapped (Signed)
Patient called to see when he needed to follow up in clinic    Let him know 6 months but if he needed something before then to let us know    Denies any other needs

## 2021-09-24 ENCOUNTER — Ambulatory Visit: Admit: 2021-09-24 | Discharge: 2021-09-25 | Payer: BLUE CROSS/BLUE SHIELD

## 2021-09-24 DIAGNOSIS — Z940 Kidney transplant status: Principal | ICD-10-CM

## 2021-09-24 LAB — CBC W/ AUTO DIFF
BASOPHILS ABSOLUTE COUNT: 0 10*9/L (ref 0.0–0.1)
BASOPHILS RELATIVE PERCENT: 0.7 %
EOSINOPHILS ABSOLUTE COUNT: 0.3 10*9/L (ref 0.0–0.5)
EOSINOPHILS RELATIVE PERCENT: 6 %
HEMATOCRIT: 41.4 % (ref 39.0–48.0)
HEMOGLOBIN: 13.4 g/dL (ref 12.9–16.5)
LYMPHOCYTES ABSOLUTE COUNT: 2.4 10*9/L (ref 1.1–3.6)
LYMPHOCYTES RELATIVE PERCENT: 41.7 %
MEAN CORPUSCULAR HEMOGLOBIN CONC: 32.4 g/dL (ref 32.0–36.0)
MEAN CORPUSCULAR HEMOGLOBIN: 28 pg (ref 25.9–32.4)
MEAN CORPUSCULAR VOLUME: 86.6 fL (ref 77.6–95.7)
MEAN PLATELET VOLUME: 9 fL (ref 6.8–10.7)
MONOCYTES ABSOLUTE COUNT: 0.6 10*9/L (ref 0.3–0.8)
MONOCYTES RELATIVE PERCENT: 10.3 %
NEUTROPHILS ABSOLUTE COUNT: 2.3 10*9/L (ref 1.8–7.8)
NEUTROPHILS RELATIVE PERCENT: 41.3 %
PLATELET COUNT: 242 10*9/L (ref 150–450)
RED BLOOD CELL COUNT: 4.78 10*12/L (ref 4.26–5.60)
RED CELL DISTRIBUTION WIDTH: 14.8 % (ref 12.2–15.2)
WBC ADJUSTED: 5.7 10*9/L (ref 3.6–11.2)

## 2021-09-24 LAB — COMPREHENSIVE METABOLIC PANEL
ALBUMIN: 3.6 g/dL (ref 3.4–5.0)
ALKALINE PHOSPHATASE: 95 U/L (ref 46–116)
ALT (SGPT): 37 U/L (ref 10–49)
ANION GAP: 11 mmol/L (ref 5–14)
AST (SGOT): 32 U/L (ref <=34)
BILIRUBIN TOTAL: 0.4 mg/dL (ref 0.3–1.2)
BLOOD UREA NITROGEN: 16 mg/dL (ref 9–23)
BUN / CREAT RATIO: 9
CALCIUM: 9.5 mg/dL (ref 8.7–10.4)
CHLORIDE: 109 mmol/L — ABNORMAL HIGH (ref 98–107)
CO2: 19.3 mmol/L — ABNORMAL LOW (ref 20.0–31.0)
CREATININE: 1.83 mg/dL — ABNORMAL HIGH
EGFR CKD-EPI (2021) MALE: 46 mL/min/{1.73_m2} — ABNORMAL LOW (ref >=60)
GLUCOSE RANDOM: 145 mg/dL — ABNORMAL HIGH (ref 70–99)
POTASSIUM: 4.1 mmol/L (ref 3.4–4.8)
PROTEIN TOTAL: 7.4 g/dL (ref 5.7–8.2)
SODIUM: 139 mmol/L (ref 135–145)

## 2021-09-24 LAB — PHOSPHORUS: PHOSPHORUS: 2.9 mg/dL (ref 2.4–5.1)

## 2021-09-24 LAB — MAGNESIUM: MAGNESIUM: 1.7 mg/dL (ref 1.6–2.6)

## 2021-09-24 NOTE — Unmapped (Signed)
Patient called concerned that the lab did not draw enough labs while he was there today    Explained to patient that he does not need to have every lab each month and we follow a protocol as to what and when to order labs.     Spoke to Joy in lab and CMV added on

## 2021-09-25 LAB — TACROLIMUS LEVEL, TROUGH: TACROLIMUS, TROUGH: 5.9 ng/mL (ref 5.0–15.0)

## 2021-09-26 LAB — CMV DNA, QUANTITATIVE, PCR
CMV QUANT LOG10: 1.85 {Log_IU}/mL — ABNORMAL HIGH (ref <0.00)
CMV QUANT: 71 [IU]/mL — ABNORMAL HIGH (ref <0)
CMV VIRAL LD: DETECTED — AB

## 2021-09-26 NOTE — Unmapped (Signed)
8/3: patient is aware generic mycophenolate is on a shortage right now, and pharmacy is only able to fill 30 days supplies for patient at this time. Patient prefers 90 days supply if possible and has enough on hand. Call set up for next week per his request - notes added to check supply/shortage status at that time and fill 90 days supply if we are able to -[name] Clinical Assessment & Refill Coordination Note    Patrick Vazquez, DOB: 1975/12/19  Phone: [phone] (home) [phone] (work)    All above HIPAA information was verified with patient.     Was a Nurse, learning disability used for this call? No    Specialty Medication(s):   Transplant:  mycophenolic acid 180mg  and tacrolimus 1mg      Current Outpatient Medications   Medication Sig Dispense Refill   ??? acetaminophen (TYLENOL) 325 MG tablet Take 1-2 tablets (325-650 mg total) by mouth every four (4) hours as needed for pain. 100 tablet 0   ??? amLODIPine (NORVASC) 10 MG tablet Take 1 tablet (10 mg total) by mouth daily. 90 tablet 3   ??? aspirin (ECOTRIN) 81 MG tablet Take 1 tablet (81 mg total) by mouth daily. 30 tablet 11   ??? atorvastatin (LIPITOR) 40 MG tablet Take 1 tablet (40 mg total) by mouth every evening. 90 tablet 3   ??? blood sugar diagnostic Strp Test Blood Sugar four times a day; E11.9 120 strip 5   ??? carvediloL (COREG) 25 MG tablet Take 1 tablet (25 mg total) by mouth Two (2) times a day. 180 tablet 3   ??? cholecalciferol, vitamin D3, (VITAMIN D3) 25 mcg (1,000 unit) Chew Chew 1 tablet daily.     ??? insulin lispro (HUMALOG) 100 unit/mL injection pen Inject 8 Units under the skin Three (3) times a day before meals. Dx code: E11.9 Diabetes (Patient taking differently: Inject 8 Units under the skin continuous. Dx code: E11.9 Diabetes) 15 mL 5   ??? insulin syringe-needle U-100 (BD VEO INSULIN SYRINGE UF) 1/2 mL 31 gauge x 15/64 (6 mm) Syrg CHECK BLOOD SUGAR 4 TIMES DAILY. 100 each 11   ??? mycophenolate (MYFORTIC) 180 MG EC tablet Take 2 tablets (360 mg total) by mouth Two (2) times a day. 360 tablet 3   ??? olmesartan (BENICAR) 20 MG tablet Take 1 tablet (20 mg total) by mouth daily. 90 tablet 3   ??? omeprazole (PRILOSEC) 40 MG capsule Take 1 capsule (40 mg total) by mouth daily. 90 capsule 3   ??? subcutaneous insulin pump Misc by Miscellaneous route continuous.     ??? tacrolimus (PROGRAF) 1 MG capsule Take 3 capsules (3 mg total) by mouth two (2) times a day. 540 capsule 3     No current facility-administered medications for this visit.        Changes to medications: Lohgan reports no changes at this time.    No Known Allergies    Changes to allergies: No    SPECIALTY MEDICATION ADHERENCE     Mycophenolate 180mg   : 15 days of medicine on hand   Tacrolimus 1mg   : 45 days of medicine on hand       Medication Adherence    Patient reported X missed doses in the last month: 0  Specialty Medication: mycophenolate 180mg   Patient is on additional specialty medications: Yes  Additional Specialty Medications: Tacrolimus 1mg   Patient Reported Additional Medication X Missed Doses in the Last Month: 0  Specialty medication(s) dose(s) confirmed: Regimen is correct and unchanged.     Are there any concerns with adherence? No    Adherence counseling provided? Not needed    CLINICAL MANAGEMENT AND INTERVENTION      Clinical Benefit Assessment:    Do you feel the medicine is effective or helping your condition? Yes    Clinical Benefit counseling provided? Not needed    Adverse Effects Assessment:    Are you experiencing any side effects? No    Are you experiencing difficulty administering your medicine? No    Quality of Life Assessment:         How many days over the past month did your transplant  keep you from your normal activities? For example, brushing your teeth or getting up in the morning. 0    Have you discussed this with your provider? Not needed    Acute Infection Status:    Acute infections noted within Epic:  No active infections  Patient reported infection: None    Therapy Appropriateness:    Is therapy appropriate and patient progressing towards therapeutic goals? Yes, therapy is appropriate and should be continued    DISEASE/MEDICATION-SPECIFIC INFORMATION      N/A    PATIENT SPECIFIC NEEDS     - Does the patient have any physical, cognitive, or cultural barriers? No    - Is the patient high risk? Yes, patient is taking a REMS drug. Medication is dispensed in compliance with REMS program    - Does the patient require a Care Management Plan? No       SHIPPING     Specialty Medication(s) to be Shipped:   na    Other medication(s) to be shipped: No additional medications requested for fill at this time     Changes to insurance: No    Delivery Scheduled: Patient declined refill at this time due to supply on hand, call set up for next week per pt request. notes also added about getting 90ds mycophenolate if supply allows.     Medication will be delivered via na to the confirmed na address in [facility].    The patient will receive a drug information handout for each medication shipped and additional FDA Medication Guides as required.  Verified that patient has previously received a Conservation officer, historic buildings and a Surveyor, mining.    The patient or caregiver noted above participated in the development of this care plan and knows that they can request review of or adjustments to the care plan at any time.      All of the patient's questions and concerns have been addressed.    Thad Ranger   [facility] Specialty Pharmacist

## 2021-10-01 MED ORDER — SODIUM BICARBONATE 650 MG TABLET
ORAL_TABLET | Freq: Two times a day (BID) | ORAL | 5 refills | 50 days | Status: CP
Start: 2021-10-01 — End: 2022-10-01
  Filled 2021-10-02: qty 100, 50d supply, fill #0

## 2021-10-01 NOTE — Unmapped (Signed)
[facility] Refill Coordination Note    Specialty Medication(s) to be Shipped:   Transplant:  mycophenolic acid 180mg     Other medication(s) to be shipped: No additional medications requested for fill at this time     Patrick Vazquez, DOB: 05/13/1975  Phone: [phone] (home) [phone] (work)      All above HIPAA information was verified with patient.     Was a Nurse, learning disability used for this call? No    Completed refill call assessment today to schedule patient's medication shipment from the [facility] [phone]).  All relevant notes have been reviewed.     Specialty medication(s) and dose(s) confirmed: Regimen is correct and unchanged.   Changes to medications: Patrick Vazquez reports no changes at this time.  Changes to insurance: No  New side effects reported not previously addressed with a pharmacist or physician: None reported  Questions for the pharmacist: No    Confirmed patient received a Conservation officer, historic buildings and a Surveyor, mining with first shipment. The patient will receive a drug information handout for each medication shipped and additional FDA Medication Guides as required.       DISEASE/MEDICATION-SPECIFIC INFORMATION        N/A    SPECIALTY MEDICATION ADHERENCE     Medication Adherence    Patient reported X missed doses in the last month: 0  Specialty Medication: mycophenolate 180 MG EC tablet (MYFORTIC)  Patient is on additional specialty medications: No                           mycophenolate 180 MG 5 days worth of medication on hand.        Were doses missed due to medication being on hold? No      REFERRAL TO PHARMACIST     Referral to the pharmacist: Not needed      [facility]     Shipping address confirmed in Epic.     Delivery Scheduled: Yes, Expected medication delivery date: 10/02/21.     Medication will be delivered via UPS to the prescription address in Epic WAM.    Patrick Vazquez   [facility] Specialty Technician

## 2021-10-01 NOTE — Unmapped (Signed)
Per Dr Elvera Maria start bicarb 650mg  BID, monitor labs and would like to see patient in clinic sept/october if availabe    Called and left patient detailed VM about labs, start sodium bicarb, and follow up apt. Asked him to call back and let me know if he would come in to meet Dr Elvera Maria before his Jan apt    Messaged [facility] to mail out bicarb to patient

## 2021-10-02 NOTE — Unmapped (Signed)
Spoke to patient about Vm yesterday.     Will start bicarb and is interested in coming to clinic in October. Sent message to add him on 10.26 at 12pm    Denies any other needs

## 2021-10-21 MED ORDER — CARVEDILOL 25 MG TABLET
ORAL_TABLET | Freq: Two times a day (BID) | ORAL | 3 refills | 90 days | Status: CP
Start: 2021-10-21 — End: 2022-10-21
  Filled 2021-10-22: qty 180, 90d supply, fill #0

## 2021-10-21 MED ORDER — ATORVASTATIN 40 MG TABLET
ORAL_TABLET | Freq: Every evening | ORAL | 3 refills | 90 days | Status: CP
Start: 2021-10-21 — End: 2022-10-21
  Filled 2021-10-22: qty 90, 90d supply, fill #0

## 2021-10-21 MED ORDER — OMEPRAZOLE 20 MG CAPSULE,DELAYED RELEASE
ORAL_CAPSULE | ORAL | 3 refills | 0 days
Start: 2021-10-21 — End: ?

## 2021-10-21 MED ORDER — OLMESARTAN 20 MG TABLET
ORAL_TABLET | Freq: Every day | ORAL | 3 refills | 90 days | Status: CP
Start: 2021-10-21 — End: 2022-10-21
  Filled 2021-10-22: qty 90, 90d supply, fill #0

## 2021-10-29 ENCOUNTER — Ambulatory Visit: Admit: 2021-10-29 | Discharge: 2021-10-30 | Payer: BLUE CROSS/BLUE SHIELD

## 2021-10-29 DIAGNOSIS — Z940 Kidney transplant status: Principal | ICD-10-CM

## 2021-10-29 LAB — COMPREHENSIVE METABOLIC PANEL
ALBUMIN: 3.8 g/dL (ref 3.4–5.0)
ALKALINE PHOSPHATASE: 123 U/L — ABNORMAL HIGH (ref 46–116)
ALT (SGPT): 62 U/L — ABNORMAL HIGH (ref 10–49)
ANION GAP: 11 mmol/L (ref 5–14)
AST (SGOT): 37 U/L — ABNORMAL HIGH (ref <=34)
BILIRUBIN TOTAL: 0.5 mg/dL (ref 0.3–1.2)
BLOOD UREA NITROGEN: 15 mg/dL (ref 9–23)
BUN / CREAT RATIO: 10
CALCIUM: 9.5 mg/dL (ref 8.7–10.4)
CHLORIDE: 108 mmol/L — ABNORMAL HIGH (ref 98–107)
CO2: 20.3 mmol/L (ref 20.0–31.0)
CREATININE: 1.46 mg/dL — ABNORMAL HIGH
EGFR CKD-EPI (2021) MALE: 60 mL/min/{1.73_m2} (ref >=60)
GLUCOSE RANDOM: 227 mg/dL — ABNORMAL HIGH (ref 70–179)
POTASSIUM: 4.5 mmol/L (ref 3.4–4.8)
PROTEIN TOTAL: 7.6 g/dL (ref 5.7–8.2)
SODIUM: 139 mmol/L (ref 135–145)

## 2021-10-29 LAB — PHOSPHORUS: PHOSPHORUS: 2.8 mg/dL (ref 2.4–5.1)

## 2021-10-29 LAB — CBC W/ AUTO DIFF
BASOPHILS ABSOLUTE COUNT: 0 10*9/L (ref 0.0–0.1)
BASOPHILS RELATIVE PERCENT: 0.6 %
EOSINOPHILS ABSOLUTE COUNT: 0.2 10*9/L (ref 0.0–0.5)
EOSINOPHILS RELATIVE PERCENT: 4.3 %
HEMATOCRIT: 41.5 % (ref 39.0–48.0)
HEMOGLOBIN: 14.1 g/dL (ref 12.9–16.5)
LYMPHOCYTES ABSOLUTE COUNT: 2.2 10*9/L (ref 1.1–3.6)
LYMPHOCYTES RELATIVE PERCENT: 43.2 %
MEAN CORPUSCULAR HEMOGLOBIN CONC: 34 g/dL (ref 32.0–36.0)
MEAN CORPUSCULAR HEMOGLOBIN: 29.2 pg (ref 25.9–32.4)
MEAN CORPUSCULAR VOLUME: 86 fL (ref 77.6–95.7)
MEAN PLATELET VOLUME: 8.5 fL (ref 6.8–10.7)
MONOCYTES ABSOLUTE COUNT: 0.5 10*9/L (ref 0.3–0.8)
MONOCYTES RELATIVE PERCENT: 9.7 %
NEUTROPHILS ABSOLUTE COUNT: 2.1 10*9/L (ref 1.8–7.8)
NEUTROPHILS RELATIVE PERCENT: 42.2 %
PLATELET COUNT: 236 10*9/L (ref 150–450)
RED BLOOD CELL COUNT: 4.83 10*12/L (ref 4.26–5.60)
RED CELL DISTRIBUTION WIDTH: 14.8 % (ref 12.2–15.2)
WBC ADJUSTED: 5 10*9/L (ref 3.6–11.2)

## 2021-10-29 LAB — MAGNESIUM: MAGNESIUM: 1.5 mg/dL — ABNORMAL LOW (ref 1.6–2.6)

## 2021-10-30 LAB — TACROLIMUS LEVEL, TROUGH: TACROLIMUS, TROUGH: 5 ng/mL (ref 5.0–15.0)

## 2021-11-05 NOTE — Unmapped (Signed)
[facility] Refill Coordination Note    Specialty Medication(s) to be Shipped:   Transplant: tacrolimus 1mg     Other medication(s) to be shipped: No additional medications requested for fill at this time     Patrick Vazquez, DOB: May 21, 1975  Phone: [phone] (home) [phone] (work)      All above HIPAA information was verified with patient.     Was a Nurse, learning disability used for this call? No    Completed refill call assessment today to schedule patient's medication shipment from the [facility] [phone]).  All relevant notes have been reviewed.     Specialty medication(s) and dose(s) confirmed: Regimen is correct and unchanged.   Changes to medications: [name] reports no changes at this time.  Changes to insurance: No  New side effects reported not previously addressed with a pharmacist or physician: None reported  Questions for the pharmacist: No    Confirmed patient received a Conservation officer, historic buildings and a Surveyor, mining with first shipment. The patient will receive a drug information handout for each medication shipped and additional FDA Medication Guides as required.       DISEASE/MEDICATION-SPECIFIC INFORMATION        N/A    SPECIALTY MEDICATION ADHERENCE     Medication Adherence    Patient reported X missed doses in the last month: 0  Specialty Medication: tacrolimus 1 mg  Patient is on additional specialty medications: No                                Were doses missed due to medication being on hold? No    tacrolimus 1 mg: 7 days of medicine on hand       REFERRAL TO PHARMACIST     Referral to the pharmacist: Not needed      [facility]     Shipping address confirmed in Epic.     Delivery Scheduled: Yes, Expected medication delivery date: 11/08/21.     Medication will be delivered via UPS to the prescription address in Epic WAM.    [name]   [facility] Specialty Technician

## 2021-11-07 MED FILL — TACROLIMUS 1 MG CAPSULE, IMMEDIATE-RELEASE: ORAL | 90 days supply | Qty: 540 | Fill #1

## 2021-11-27 MED FILL — SODIUM BICARBONATE 650 MG TABLET: ORAL | 50 days supply | Qty: 100 | Fill #1

## 2021-12-09 DIAGNOSIS — Z1211 Encounter for screening for malignant neoplasm of colon: Principal | ICD-10-CM

## 2021-12-11 MED ORDER — AMLODIPINE 10 MG TABLET
ORAL_TABLET | Freq: Every day | ORAL | 3 refills | 90 days | Status: CP
Start: 2021-12-11 — End: 2022-12-11
  Filled 2021-12-12: qty 90, 90d supply, fill #0

## 2021-12-11 NOTE — Unmapped (Signed)
Patient called and requested amlodipine be sent to [facility]. Prescription sent    Doing well and denies any other needs

## 2021-12-16 DIAGNOSIS — D849 Immunodeficiency, unspecified: Principal | ICD-10-CM

## 2021-12-16 DIAGNOSIS — Z79899 Other long term (current) drug therapy: Principal | ICD-10-CM

## 2021-12-16 DIAGNOSIS — Z940 Kidney transplant status: Principal | ICD-10-CM

## 2021-12-18 NOTE — Unmapped (Signed)
NEPHROLOGY & HYPERTENSION   TRANSPLANT FOLLOW UP     PCP: Roda Shutters, Family M   Kidney transplant coordinator: Artis Flock    Date of Visit at Transplant clinic: 12/19/2021     Assessment/Recommendations:     # s/p deceased donor kidney transplant 01/28/2016   Native kidney disease T1DM.  Graft function 1.4-1.8  DSAs: none detected 04/02/21    # Immunosuppression  Tacrolimus (Prograf) 3 mg BID, goal trough 5-7 ng/mL  Mycophenolate (Myfortic) 360 mg BID    # Acute issues today  none    # BP management   Goal 130/80, as tolerated.  - amlodipine 10 mg daily  - carvedilol 25 mg daily  - olmesartan 20 mg daily    # Infectious disease  CMV D+/R-, EBV D+/R+=  CMV ppx:    PJP ppx:    # Anemia screening  Hb 14.1    # Cardiovascular: primary CAD prevention  # History of stroke Sept 2015 due to uncontrolled HTN - LLE weakness, treated at [facility], with full recovery.  The 10-year ASCVD risk score (Arnett DK, et al., 2019) is: 13.2%  Atorvastatin 40 mg daily  ASA 81 mg daily    # CKD-BMD  Ca and phos normal  PTH 99.6 07/31/21    # Electrolytes  Mg 1.5 off supp  K acceptable    # Comorbidities  Type 1 DM: HbA1c 6.9 on 07/31/21.   - NPH 20 units twice daily  - Humalog 8 units + correction with meals  - he sees an endrocrinologist for T1DM    Weight management:  Had weight loss benefit with Trulicity, but stopped due to diarrhea with increased dose to 1.5 mg weekly.  - start Wegovy 0.25 mg weekly, and if tolerating can increase to 0.5 mg  - referrals placed to nutritionist Greta Doom) and weight management clinic for weight loss    # Immunizations  COVID booster in clinic today. Recommend Shingrix vaccine.  Immunization History   Administered Date(s) Administered    COVID-19 VACCINE,MRNA(MODERNA)(PF) 04/27/2019, 06/01/2019, 11/01/2019, 06/27/2020    Covid-19 Vac, (48yr+) (Spikevax) Monovalent Xbb.1.5 Moder  12/19/2021    Influenza Vaccine Quad (IIV4 PF) 37mo+ injectable 03/03/2017, 10/27/2017, 11/25/2018, 01/04/2020, 02/27/2021, 12/05/2021    Influenza Virus Vaccine, unspecified formulation 11/09/2015, 03/03/2017, 10/27/2017       # Cancer screening  Colonoscopy: need to start, placed order for colonoscopy  Skin: recommend yearly dermatology evaluation    # Follow up:  Labs monthly  Visits return in 6 months      Kidney Transplant History:   Date of Transplant: 01/28/2016 (Kidney)  Type of Transplant: DDKT, DCD  KDPI: 89%  Cold ischemic time: 1,046 minutes (17hr )  Warm ischemic time: 49 minutes  cPRA: 0%  HLA match:   Blood type: Donor O, Recipient O POS  ID: CMV D+/R-, EBV D+/R+  Native Kidney Disease: T1DM   Native kidney biopsy: no   Pre-transplant dialysis course: HD since 12/08/2012  Post-Transplant Course:    Delayed graft function requiring dialysis: Yes   Other complications: no  Prior Transplants: None  Induction: Campath  Early steroid withdrawal: Yes    Biopsies:   Zero-Hour Biopsy: Moderate to severe arteriosclerosis observed in some of the vascular cross sections; Mild acute tubular injury.    History of Presenting Illness:     Since the last visit:  - last saw Dr. Nestor Lewandowsky at [facility] in September  - he stopped taking Trulicity after having frequent diarrhea  with increase from 0.5 to 1.5 mg weekly  - home BP 120s/80s    Concerns about nonadherence: No    Social: Lives in [place].    Review of Systems:   A 12-system review was negative except as documented in the HPI.    Physical Exam:     BP 125/82 (BP Site: R Arm, BP Position: Sitting, BP Cuff Size: Large)  - Pulse 73  - Temp 36.4 ??C (97.5 ??F) (Temporal)  - Ht 182.9 cm (6')  - Wt (!) 137.3 kg (302 lb 12.8 oz)  - BMI 41.07 kg/m??   Constitutional:  Well-appearing in NAD  Eyes:  anicteric sclerae  ENT:  MMM  CV:  RRR, no m/r/g, no JVD, extremities WWP with no edema  Resp:  Good air movement, CTAB  GI:  Abdomen soft, NTND, +bs  MSK:  Grossly normal, exam is limited  Skin:  Normal turgor, no rash  Neuro:  Grossly normal, exam is limited  Psych:  Normal affect      Allergies: No Known Allergies     Current Medications:   Current Outpatient Medications   Medication Sig Dispense Refill    acetaminophen (TYLENOL) 325 MG tablet Take 1-2 tablets (325-650 mg total) by mouth every four (4) hours as needed for pain. 100 tablet 0    amLODIPine (NORVASC) 10 MG tablet Take 1 tablet (10 mg total) by mouth daily. 90 tablet 3    aspirin (ECOTRIN) 81 MG tablet Take 1 tablet (81 mg total) by mouth daily. 30 tablet 11    atorvastatin (LIPITOR) 40 MG tablet Take 1 tablet (40 mg total) by mouth every evening. 90 tablet 3    blood sugar diagnostic Strp Test Blood Sugar four times a day; E11.9 120 strip 5    carvediloL (COREG) 25 MG tablet Take 1 tablet (25 mg total) by mouth Two (2) times a day. 180 tablet 3    cholecalciferol, vitamin D3, (VITAMIN D3) 25 mcg (1,000 unit) Chew Chew 1 tablet daily.      insulin lispro (HUMALOG) 100 unit/mL injection pen Inject 8 Units under the skin Three (3) times a day before meals. Dx code: E11.9 Diabetes (Patient taking differently: Inject 8 Units under the skin continuous. Dx code: E11.9 Diabetes) 15 mL 5    insulin syringe-needle U-100 (BD VEO INSULIN SYRINGE UF) 1/2 mL 31 gauge x 15/64 (6 mm) Syrg CHECK BLOOD SUGAR 4 TIMES DAILY. 100 each 11    mycophenolate (MYFORTIC) 180 MG EC tablet Take 2 tablets (360 mg total) by mouth Two (2) times a day. 360 tablet 3    olmesartan (BENICAR) 20 MG tablet Take 1 tablet (20 mg total) by mouth daily. 90 tablet 3    omeprazole (PRILOSEC) 40 MG capsule Take 1 capsule (40 mg total) by mouth daily. 90 capsule 3    sodium bicarbonate 650 mg tablet Take 1 tablet (650 mg total) by mouth Two (2) times a day. 100 tablet 5    tacrolimus (PROGRAF) 1 MG capsule Take 3 capsules (3 mg total) by mouth two (2) times a day. 540 capsule 3    omeprazole (PRILOSEC) 20 MG capsule Take 1 capsule (20 mg total) by mouth 1 (one) time each day (Patient not taking: Reported on 12/19/2021) 90 capsule 3    semaglutide 0.25 mg or 0.5 mg (2 mg/3 mL) PnIj Inject 0.25 mg under the skin every seven (7) days for 28 days, THEN 0.5 mg every seven (7) days. 7.5 mL 0  subcutaneous insulin pump Misc by Miscellaneous route continuous.       No current facility-administered medications for this visit.       Past Medical History:   Past Medical History:   Diagnosis Date    CVA (cerebral vascular accident) (CMS-HCC) 10/2013    Diabetes mellitus (CMS-HCC)     Type 1    ESRD (end stage renal disease) (CMS-HCC)     HTN (hypertension)         Laboratory studies:   Reviewed recent results.    Scribe's Attestation: Dewitt Hoes, MD obtained and performed the history, physical exam and medical decision making elements that were entered into the chart.  Signed by Lauralee Evener, Scribe, on December 19, 2021 at 1:44 PM.    Documentation assistance provided by the Scribe. I was present during the time the encounter was recorded. The information recorded by the Scribe was done at my direction and has been reviewed and validated by me.  Leafy Half, MD

## 2021-12-19 ENCOUNTER — Ambulatory Visit: Admit: 2021-12-19 | Discharge: 2021-12-20 | Payer: BLUE CROSS/BLUE SHIELD

## 2021-12-19 DIAGNOSIS — Z940 Kidney transplant status: Principal | ICD-10-CM

## 2021-12-19 DIAGNOSIS — Z7689 Persons encountering health services in other specified circumstances: Principal | ICD-10-CM

## 2021-12-19 DIAGNOSIS — I151 Hypertension secondary to other renal disorders: Principal | ICD-10-CM

## 2021-12-19 DIAGNOSIS — Z79899 Other long term (current) drug therapy: Principal | ICD-10-CM

## 2021-12-19 DIAGNOSIS — D849 Immunodeficiency, unspecified: Principal | ICD-10-CM

## 2021-12-19 DIAGNOSIS — Z1211 Encounter for screening for malignant neoplasm of colon: Principal | ICD-10-CM

## 2021-12-19 LAB — COMPREHENSIVE METABOLIC PANEL
ALBUMIN: 3.6 g/dL (ref 3.4–5.0)
ALKALINE PHOSPHATASE: 109 U/L (ref 46–116)
ALT (SGPT): 36 U/L (ref 10–49)
ANION GAP: 10 mmol/L (ref 5–14)
AST (SGOT): 25 U/L (ref <=34)
BILIRUBIN TOTAL: 0.4 mg/dL (ref 0.3–1.2)
BLOOD UREA NITROGEN: 35 mg/dL — ABNORMAL HIGH (ref 9–23)
BUN / CREAT RATIO: 20
CALCIUM: 9.9 mg/dL (ref 8.7–10.4)
CHLORIDE: 107 mmol/L (ref 98–107)
CO2: 18.7 mmol/L — ABNORMAL LOW (ref 20.0–31.0)
CREATININE: 1.79 mg/dL — ABNORMAL HIGH
EGFR CKD-EPI (2021) MALE: 46 mL/min/{1.73_m2} — ABNORMAL LOW (ref >=60)
GLUCOSE RANDOM: 281 mg/dL — ABNORMAL HIGH (ref 70–99)
POTASSIUM: 4.4 mmol/L (ref 3.4–4.8)
PROTEIN TOTAL: 7.7 g/dL (ref 5.7–8.2)
SODIUM: 136 mmol/L (ref 135–145)

## 2021-12-19 LAB — URINALYSIS WITH MICROSCOPY
BILIRUBIN UA: NEGATIVE
BLOOD UA: NEGATIVE
GLUCOSE UA: NEGATIVE
GRANULAR CASTS: <1 /LPF — ABNORMAL HIGH (ref <=0)
KETONES UA: NEGATIVE
LEUKOCYTE ESTERASE UA: NEGATIVE
NITRITE UA: NEGATIVE
PH UA: 6 (ref 5.0–9.0)
PROTEIN UA: NEGATIVE
RBC UA: <1 /HPF (ref <3)
SPECIFIC GRAVITY UA: 1.02 (ref 1.005–1.030)
SQUAMOUS EPITHELIAL: <1 /HPF (ref 0–5)
UROBILINOGEN UA: 0.2
WBC UA: <1 /HPF (ref <2)

## 2021-12-19 LAB — CBC W/ AUTO DIFF
BASOPHILS ABSOLUTE COUNT: 0 10*9/L (ref 0.0–0.1)
BASOPHILS RELATIVE PERCENT: 0.4 %
EOSINOPHILS ABSOLUTE COUNT: 0.1 10*9/L (ref 0.0–0.5)
EOSINOPHILS RELATIVE PERCENT: 1.7 %
HEMATOCRIT: 41.1 % (ref 39.0–48.0)
HEMOGLOBIN: 13.4 g/dL (ref 12.9–16.5)
LYMPHOCYTES ABSOLUTE COUNT: 2 10*9/L (ref 1.1–3.6)
LYMPHOCYTES RELATIVE PERCENT: 47 %
MEAN CORPUSCULAR HEMOGLOBIN CONC: 32.7 g/dL (ref 32.0–36.0)
MEAN CORPUSCULAR HEMOGLOBIN: 28.5 pg (ref 25.9–32.4)
MEAN CORPUSCULAR VOLUME: 86.9 fL (ref 77.6–95.7)
MEAN PLATELET VOLUME: 9 fL (ref 6.8–10.7)
MONOCYTES ABSOLUTE COUNT: 0.3 10*9/L (ref 0.3–0.8)
MONOCYTES RELATIVE PERCENT: 6.6 %
NEUTROPHILS ABSOLUTE COUNT: 1.9 10*9/L (ref 1.8–7.8)
NEUTROPHILS RELATIVE PERCENT: 44.3 %
PLATELET COUNT: 225 10*9/L (ref 150–450)
RED BLOOD CELL COUNT: 4.72 10*12/L (ref 4.26–5.60)
RED CELL DISTRIBUTION WIDTH: 14.3 % (ref 12.2–15.2)
WBC ADJUSTED: 4.2 10*9/L (ref 3.6–11.2)

## 2021-12-19 LAB — PHOSPHORUS: PHOSPHORUS: 2.5 mg/dL (ref 2.4–5.1)

## 2021-12-19 LAB — PROTEIN / CREATININE RATIO, URINE
CREATININE, URINE: 146.8 mg/dL
PROTEIN URINE: 18.9 mg/dL
PROTEIN/CREAT RATIO, URINE: 0.129

## 2021-12-19 LAB — MAGNESIUM: MAGNESIUM: 1.7 mg/dL (ref 1.6–2.6)

## 2021-12-19 MED ORDER — SEMAGLUTIDE 0.25 MG OR 0.5 MG (2 MG/3 ML) SUBCUTANEOUS PEN INJECTOR
SUBCUTANEOUS | 0 refills | 88 days | Status: CP
Start: 2021-12-19 — End: 2022-03-17
  Filled 2021-12-26: qty 6, 70d supply, fill #0

## 2021-12-19 NOTE — Unmapped (Signed)
Transplant Coordinator, Clinic Visit   Pt seen today by transplant nephrology for follow up, reviewed medications and symptoms.   Met with pt during clinic visit today, pt reports that he is doing well. Pt does report that he has been having a hard time with his weight. He was on Trulicity which was helping but when he went up on doses it caused so much diarrhea and upset stomach that he had to stop./     Assessment  BP: 125/82 today in clinic. PT reports home BP's around the same  Lightheaded: denies  BG: see endocrinology  Headache: denies  Hand tremors: denies  Numbness/tingling: denies  Fevers: denies  Chills/sweats: denies  Shortness of breath: denies  Chest pain or pressure: denies  Palpitations: denies  Abdominal pain: denies  Heart burn: denies  Nausea/vomiting: denies  Diarrhea/constipation: denies  UTI symptoms: denies  Swelling: denies    Good appetite; reports adequate hydration.     Intake: 3 bottles water per day    Any new medications? denies  Immunosuppressant last taken: Last night at 2130    Immunization status: pt already had flu shot, will get covid vaccine today     Functional Score: 100   Normal no complaints; no evidence of  disease.     I spent a total of 10 minutes with Patrick Vazquez reviewing medications and symptoms.

## 2021-12-19 NOTE — Unmapped (Addendum)
Plan for semaglutide [facility]):   - start 0.25 mg once per week for at least 4 weeks  - if you're tolerating the starting dose, increase to 0.5 mg once per week  - at your next visit with either nephrology or endocrinology, if you're tolerating 0.5 mg can consider going up to 1 mg weekly.  - sent to [facility]            Non-pharmacologic strategies to manage weight and blood sugars:  - 150 minutes exercise a week (choose a mix of moderate aerobic exercise such as walking, dancing or gardening, plus some resistance training such as using resistance bands or weights)  - Drink mostly water. Most of your beverages should be calorie-free. See beverage advice below.   - ideal plate: 1/2 vegetables, 1/4 protein, 1/4 starch  - I do not recommend Atkins diet or other low-carb diets in kidney transplant because we believe the high protein diets put extra stress on kidney. Protein intake on most days should not be higher than 1 g/kg/day (for you that means 135 grams of protein per day).   - I recommend you have a visit with our kidney nutritionist, Greta Doom. I have placed a referral.         More info on beverage choices:  For many people, making a change in which fluids you drink may make a big difference for your blood sugars.  - drink mostly water!  - Both soda and juice have a lot of sugar; limit those to occasional (0-3 times per week).  - Artificial sweeteners: this is a very individual decision. For some people, they cause gas pain or other GI upset. For other people, artificial sweeteners really help to reduce sugary beverages. Do what is right for you.   - If you drink coffee, consider drinking it either black or with cream only, not sugar. Try to reduce or eliminate sugar from your coffee.     Health maintenance:  - colonoscopy  - I recommend Shingrix vaccine at your local pharmacy (2 dose series to protect against shingles)

## 2021-12-20 DIAGNOSIS — Z940 Kidney transplant status: Principal | ICD-10-CM

## 2021-12-20 DIAGNOSIS — Z7689 Persons encountering health services in other specified circumstances: Principal | ICD-10-CM

## 2021-12-20 LAB — TACROLIMUS LEVEL, TROUGH: TACROLIMUS, TROUGH: 3.5 ng/mL — ABNORMAL LOW (ref 5.0–15.0)

## 2021-12-20 LAB — CMV DNA, QUANTITATIVE, PCR
CMV QUANT: <50 [IU]/mL — ABNORMAL HIGH (ref <0)
CMV VIRAL LD: DETECTED — AB

## 2021-12-23 NOTE — Unmapped (Signed)
[facility] Refill Coordination Note    Specialty Medication(s) to be Shipped:   Transplant: mycophenolate mofetil 180 mg    Other medication(s) to be shipped:  [employer], DOB: 02-23-1976  Phone: [phone] (home) [phone] (work)      All above HIPAA information was verified with patient.     Was a Nurse, learning disability used for this call? No    Completed refill call assessment today to schedule patient's medication shipment from the [facility] [phone]).  All relevant notes have been reviewed.     Specialty medication(s) and dose(s) confirmed: Regimen is correct and unchanged.   Changes to medications: Patrick Vazquez reports no changes at this time.  Changes to insurance: No  New side effects reported not previously addressed with a pharmacist or physician: None reported  Questions for the pharmacist: No    Confirmed patient received a Conservation officer, historic buildings and a Surveyor, mining with first shipment. The patient will receive a drug information handout for each medication shipped and additional FDA Medication Guides as required.       DISEASE/MEDICATION-SPECIFIC INFORMATION        N/A    SPECIALTY MEDICATION ADHERENCE     Medication Adherence    Patient reported X missed doses in the last month: 0  Specialty Medication: mycophenolate 180 mg  Patient is on additional specialty medications: No                                Were doses missed due to medication being on hold? No    mycophenolate 180 mg: 7 days of medicine on hand       REFERRAL TO PHARMACIST     Referral to the pharmacist: Not needed      [facility]     Shipping address confirmed in Epic.     Delivery Scheduled: Yes, Expected medication delivery date: 12/27/21.     Medication will be delivered via UPS to the prescription address in Epic WAM.    Patrick Vazquez   [facility] Specialty Technician

## 2021-12-24 LAB — BK VIRUS QUANTITATIVE PCR, BLOOD: BK BLOOD RESULT: NOT DETECTED

## 2021-12-26 MED FILL — MYCOPHENOLATE SODIUM 180 MG TABLET,DELAYED RELEASE: ORAL | 90 days supply | Qty: 360 | Fill #1

## 2022-01-09 DIAGNOSIS — N1831 Chronic kidney disease, stage 3a (CMS-HCC): Principal | ICD-10-CM

## 2022-01-09 DIAGNOSIS — E1122 Type 2 diabetes mellitus with diabetic chronic kidney disease: Principal | ICD-10-CM

## 2022-01-09 DIAGNOSIS — N189 Chronic kidney disease, unspecified: Principal | ICD-10-CM

## 2022-01-14 MED FILL — SODIUM BICARBONATE 650 MG TABLET: ORAL | 50 days supply | Qty: 100 | Fill #2

## 2022-01-22 MED FILL — ATORVASTATIN 40 MG TABLET: ORAL | 90 days supply | Qty: 90 | Fill #1

## 2022-01-22 MED FILL — OLMESARTAN 20 MG TABLET: ORAL | 90 days supply | Qty: 90 | Fill #1

## 2022-01-22 MED FILL — CARVEDILOL 25 MG TABLET: ORAL | 90 days supply | Qty: 180 | Fill #1

## 2022-01-22 MED FILL — OMEPRAZOLE 20 MG CAPSULE,DELAYED RELEASE: ORAL | 90 days supply | Qty: 90 | Fill #1

## 2022-01-23 ENCOUNTER — Telehealth
Admit: 2022-01-23 | Discharge: 2022-01-24 | Payer: BLUE CROSS/BLUE SHIELD | Attending: Registered" | Primary: Registered"

## 2022-01-23 DIAGNOSIS — Z7689 Persons encountering health services in other specified circumstances: Principal | ICD-10-CM

## 2022-01-23 NOTE — Unmapped (Unsigned)
[facility] Outpatient Nutrition Services   Medical Nutrition Therapy Consultation       Visit Type:    Initial Assessment    Referral Reason: :  Post renal transplant       Patrick Vazquez is a 46 y.o. male seen for medical nutrition therapy for post renal transplant. His active problem list, medication list, allergies, family history, social history, notes from last several encounters, and lab results were reviewed.     His interim medical history is significant for end stage renal disease post renal transplant, hypertension, GERD, type 1 diabetes, hyperlipidemia, and obesity.    Anthropometrics on January 23, 2022  Height: 182.9 cm (6')   Weight: (!) 136.1 kg (300 lb)  Body mass index is 40.69 kg/m??.    Usual body weight: 300 - 302 pounds for the past year  Ideal Body Weight: 80.79 kg  Adjusted Body Weight: 101 kg        Wt Readings from Last 5 Encounters:   01/23/22 (!) 136.1 kg (300 lb)   12/19/21 (!) 137.3 kg (302 lb 12.8 oz)   07/31/21 (!) 143.2 kg (315 lb 12.8 oz)   02/27/21 (!) 146.1 kg (322 lb 3.2 oz)   06/27/20 (!) 134.3 kg (296 lb)        Nutrition Risk Screening:     Nutrition Focused Physical Exam:     Unable to complete Malnutrition Assessment at this time due to (comment) (01/23/22 1356)  COVID 19 precautions    Frailty Index Score:   None provided     Malnutrition Screening:   Unable to complete Malnutrition Assessment at this time due to (comment) (01/23/22 1356)  COVID 19 precautions    Biochemical Data, Medical Tests and Procedures:  All pertinent labs reviewed by Piedad Climes, RD/LDN prior to visit    Lab Results   Component Value Date    BUN 35 (H) 12/19/2021    CREATININE 1.79 (H) 12/19/2021    GFRAA 53 (L) 11/13/2020    GFRNONAA 49 (L) 08/08/2020    NA 136 12/19/2021    K 4.4 12/19/2021    CL 107 12/19/2021    CO2 18.7 (L) 12/19/2021    CALCIUM 9.9 12/19/2021    PHOS 2.5 12/19/2021    ALBUMIN 3.6 12/19/2021    PTH 99.6 (H) 07/31/2021       No results found for: [facility]    Lab Results   Component Value Date    MG 1.7 12/19/2021    MG 1.5 (L) 10/29/2021    MG 1.7 09/24/2021    PHOS 2.5 12/19/2021    PHOS 2.8 10/29/2021    PHOS 2.9 09/24/2021       Lab Results   Component Value Date    CHOL 125 07/31/2021    HDL 33 (L) 07/31/2021    LDL 60 07/31/2021    TRIG 161 (H) 07/31/2021       Lab Results   Component Value Date    A1C 6.9 (H) 07/31/2021    A1C 6.5 (H) 07/09/2021    A1C 6.4 (H) 04/30/2021       Lab Results   Component Value Date    PROTEINUA Negative 12/19/2021    GLUCOSEU Negative 12/19/2021       Lab Results   Component Value Date    HGB 13.4 12/19/2021    HCT 41.1 12/19/2021       No results found for: IRON, TIBC, FERRITIN  Fasting about 140 mg/dL  Has CGM     Medications and Vitamin/Mineral Supplementation:   All nutritionally pertinent medications reviewed on 01/23/2022.   Nutritionally pertinent medications include: atorvastatin, insulin lispro,  insulin NPH (not listed), omeprazole, tacrolimus   He is taking nutrition supplements. Cholecalciferol    Not taking semaglutide     Current Outpatient Medications   Medication Sig Dispense Refill    acetaminophen (TYLENOL) 325 MG tablet Take 1-2 tablets (325-650 mg total) by mouth every four (4) hours as needed for pain. 100 tablet 0    amLODIPine (NORVASC) 10 MG tablet Take 1 tablet (10 mg total) by mouth daily. 90 tablet 3    aspirin (ECOTRIN) 81 MG tablet Take 1 tablet (81 mg total) by mouth daily. 30 tablet 11    atorvastatin (LIPITOR) 40 MG tablet Take 1 tablet (40 mg total) by mouth every evening. 90 tablet 3    blood sugar diagnostic Strp Test Blood Sugar four times a day; E11.9 120 strip 5    carvediloL (COREG) 25 MG tablet Take 1 tablet (25 mg total) by mouth Two (2) times a day. 180 tablet 3    cholecalciferol, vitamin D3, (VITAMIN D3) 25 mcg (1,000 unit) Chew Chew 1 tablet daily.      insulin lispro (HUMALOG) 100 unit/mL injection pen Inject 8 Units under the skin Three (3) times a day before meals. Dx code: E11.9 Diabetes (Patient taking differently: Inject 8 Units under the skin continuous. Dx code: E11.9 Diabetes) 15 mL 5    insulin syringe-needle U-100 (BD VEO INSULIN SYRINGE UF) 1/2 mL 31 gauge x 15/64 (6 mm) Syrg CHECK BLOOD SUGAR 4 TIMES DAILY. 100 each 11    mycophenolate (MYFORTIC) 180 MG EC tablet Take 2 tablets (360 mg total) by mouth Two (2) times a day. 360 tablet 3    olmesartan (BENICAR) 20 MG tablet Take 1 tablet (20 mg total) by mouth daily. 90 tablet 3    omeprazole (PRILOSEC) 20 MG capsule Take 1 capsule (20 mg total) by mouth 1 (one) time each day (Patient not taking: Reported on 12/19/2021) 90 capsule 3    omeprazole (PRILOSEC) 40 MG capsule Take 1 capsule (40 mg total) by mouth daily. 90 capsule 3    semaglutide 0.25 mg or 0.5 mg (2 mg/3 mL) PnIj Inject 0.25 mg under the skin every seven (7) days for 28 days, THEN 0.5 mg every seven (7) days. 9 mL 0    sodium bicarbonate 650 mg tablet Take 1 tablet (650 mg total) by mouth Two (2) times a day. 100 tablet 5    subcutaneous insulin pump Misc by Miscellaneous route continuous.      tacrolimus (PROGRAF) 1 MG capsule Take 3 capsules (3 mg total) by mouth two (2) times a day. 540 capsule 3     No current facility-administered medications for this visit.       Nutrition History:     Dietary Restrictions: He avoids intake of grapefruit, pomegranate, star fruit and limit clementine x 1 daily   due to food drug interaction.     Gastrointestinal Issues: Denied issues  Denies any problems chewing, swallowing, nausea, vomiting, or diarrhea at this time.     Hunger and Satiety:  Report  good appetite     Food Safety and Access: He did not report issues.     Typical Diet Recall: he counts CHO; he stopped drinking soda  Time Intake   Breakfast Sandwich  ([place] or chicken or roast beef and ham with mayo  on whole wheat with pickles) +/-  bojangles potato round with crystal light    Snack (AM) skips   Lunch skips   Snack (PM) Oatmeal cake with his Oatmeal cake with his sugar is low   Dinner Chicken with salad (lettuce cheese black olives pickles ) or subway (deli meat with pickles and olives) or steak with salad with ranch dressing with water or crystal light or chicken or fish with coleslaw with beans or pasta with red sauce or alfredo or spaghetti (pasta with red sauce with ground turkey/beef) with salad or [place] food (texas fajita beef with peppers an onions) or tuna with mayo and pickles (plain)    Snack (HS) Skips      Food-Related History:  Snacks:  Oatmeal cake   Beverages:  water or crystal light  Dining Out:  2/21  Cooking Methods: stove top or grill  Usual Food Choices: listed above    Meal Schedule:  no     Eating Behaviors:  Overeating: Lacks portion control with meals and snacks. At special occasions   Emotional Eating: Denied issues.       Physical Activity:  Physical activity level is sedentary with little to no exercise.     Daily Estimated Nutrient Needs:  Energy:   1785-1940 kcals [  23-25 kcal using 77.6 kg  , ideal body weight  ]  Protein:  62-78  gm [  0.8-1 grams using  77.6 kg  , ideal body weight ]  Carbohydrate:   201-218 grams based on 45% total calories  Fluid:   per MD  Sodium:  <2300 mg    Nutrition Goals & Evaluation      Weight loss at least 117 pounds for a BMI < 25  (New)  Eat at least 3 meals with lean protein and adequate carbohydrates  (New)  Glycemic control for a HgbA1c <7%  (New)  Exercise at least 150 minutes weekly  (New)      Nutrition goals reviewed, and relevant barriers identified and addressed: knowledge deficit . He is evaluated to have good willingness and ability to achieve nutrition goals.     Nutrition Assessment       Current diet is not adequate or appropriate at this time for post renal medical nutrition therapy for transplant. Patient currently skips meals, consumes highly processed foods/high sodium/sugar foods, consumes excessive amounts of protein, with limited intake of non-starchy low potassium intake, limited lean protein, non-starchy low potassium vegetables/fruit, whole grains, low fat dairy foods with limited physical activity. Patient would benefit from nutritional and lifestyle changes to assist with nutrition goals.   Patient is obese (class ***). Patient has been weight *** for the past *** months. Patient is now aware of the need for weight loss and reducing waist circumference for renal transplant and is willing to work towards weight loss goals.   Blood sugar are *** at this time. Patient may benefit from glucose monitoring to determine if medication, nutrition and/or lifestyle changes are needed to manage blood sugar for glycemic goal.   Patient assessed to have nutrition knowledge deficit for *** medical nutrition therapy and would benefit from on-going nutrition education and counseling.          Nutrition Intervention      - Nutrition Education: post renal transplant medical nutrition therapy diet/eating style. Education resources provided include: handouts promoting nutrition intervention  and contact information   - Meals and Snacks    Nutrition Plan:   Eat 3 meals  daily with lean protein and adequate carbohydrates  Exercise at least 150 minutes weekly  Drink your fluids according to your provider's recommendation  Check your blood sugars as directed by your provider; your goal is for your glycemic control to have a HgbA1c <7%    Follow up will occur in 3 months.     Food/Nutrition-related history, Anthropometric measurements, Biochemical data, medical tests, procedures, Nutrition-focused physical findings, Patient understanding or compliance with intervention and recommendations , and Effectiveness of nutrition interventions will be assessed at time of follow-up.         Recommendations for Clinical Team:  Eat 3 meals daily with lean protein and adequate carbohydrates  Exercise at least 150 minutes weekly  Drink your fluids according to your provider's recommendation  Check your blood sugars as directed by your provider; your goal is for your glycemic control to have a HgbA1c <7%       Time spent 51 minutes   {    Coding tips - Do not edit this text, it will delete upon signing of note!    Telephone visits [phone] for Physicians and APPs and [phone] for Non- Physician Clinicians)- Only use minutes on the phone to determine level of service.    Video visits [phone]) - Use either level of medical decision making just as an in-person visit OR time which includes both minutes on video and pre/post minutes to determine the level of service.      :[id number]}  The patient reports they are physically located in [place] and is currently: not at home. I conducted a audio/video visit. I spent  79m 53s on the video call with the patient. I spent an additional 15 minutes on pre- and post-visit activities on the date of service .     The patient was not located and I was located within 250 yards of a hospital-based location during the real-time audio and video.        I am located on-site and the patient is located off-site for this visit.        Greta Doom, MS RD - Clinical Dietitian II  Outpatient Nutrition Services   [facility]   [address], [place], [place] [address]  p [phone]- f [phone]  [email or web address]

## 2022-01-23 NOTE — Unmapped (Signed)
Eat 3 meals daily with lean protein and adequate carbohydrates  Exercise at least 150 minutes weekly  Drink your fluids according to your provider's recommendation  Check your blood sugars as directed by your provider; your goal is for your glycemic control to have a HgbA1c <7%    Weight goal 235 pounds

## 2022-01-24 NOTE — Unmapped (Signed)
[facility] Refill Coordination Note    Specialty Medication(s) to be Shipped:   Transplant: tacrolimus 1mg     Other medication(s) to be shipped: No additional medications requested for fill at this time     Patrick Vazquez, DOB: 1975-03-12  Phone: [phone] (home) [phone] (work)      All above HIPAA information was verified with patient.     Was a Nurse, learning disability used for this call? No    Completed refill call assessment today to schedule patient's medication shipment from the [facility] [phone]).  All relevant notes have been reviewed.     Specialty medication(s) and dose(s) confirmed: Regimen is correct and unchanged.   Changes to medications: Patrick Vazquez reports no changes at this time.  Changes to insurance: No  New side effects reported not previously addressed with a pharmacist or physician: None reported  Questions for the pharmacist: No    Confirmed patient received a Conservation officer, historic buildings and a Surveyor, mining with first shipment. The patient will receive a drug information handout for each medication shipped and additional FDA Medication Guides as required.       DISEASE/MEDICATION-SPECIFIC INFORMATION        N/A    SPECIALTY MEDICATION ADHERENCE     Medication Adherence    Patient reported X missed doses in the last month: 0  Specialty Medication: tacrolimus 1 MG capsule (PROGRAF)  Patient is on additional specialty medications: No  Patient is on more than two specialty medications: No                                Were doses missed due to medication being on hold? No    tacrolimus 1 mg: 7 days of medicine on hand       REFERRAL TO PHARMACIST     Referral to the pharmacist: Not needed      [facility]     Shipping address confirmed in Epic.     Delivery Scheduled: Yes, Expected medication delivery date: 01/29/22.     Medication will be delivered via UPS to the prescription address in Epic WAM.    Patrick Vazquez   [facility] Specialty Technician

## 2022-01-28 MED FILL — TACROLIMUS 1 MG CAPSULE, IMMEDIATE-RELEASE: ORAL | 90 days supply | Qty: 540 | Fill #2

## 2022-02-07 ENCOUNTER — Ambulatory Visit: Admit: 2022-02-07 | Discharge: 2022-02-08 | Payer: BLUE CROSS/BLUE SHIELD

## 2022-02-07 DIAGNOSIS — Z940 Kidney transplant status: Principal | ICD-10-CM

## 2022-02-07 LAB — COMPREHENSIVE METABOLIC PANEL
ALBUMIN: 3.8 g/dL (ref 3.4–5.0)
ALKALINE PHOSPHATASE: 84 U/L (ref 46–116)
ALT (SGPT): 24 U/L (ref 10–49)
ANION GAP: 9 mmol/L (ref 5–14)
AST (SGOT): 23 U/L (ref <=34)
BILIRUBIN TOTAL: 0.7 mg/dL (ref 0.3–1.2)
BLOOD UREA NITROGEN: 17 mg/dL (ref 9–23)
BUN / CREAT RATIO: 10
CALCIUM: 9.8 mg/dL (ref 8.7–10.4)
CHLORIDE: 108 mmol/L — ABNORMAL HIGH (ref 98–107)
CO2: 22.2 mmol/L (ref 20.0–31.0)
CREATININE: 1.64 mg/dL — ABNORMAL HIGH
EGFR CKD-EPI (2021) MALE: 52 mL/min/{1.73_m2} — ABNORMAL LOW (ref >=60)
GLUCOSE RANDOM: 71 mg/dL (ref 70–179)
POTASSIUM: 4.3 mmol/L (ref 3.4–4.8)
PROTEIN TOTAL: 8 g/dL (ref 5.7–8.2)
SODIUM: 139 mmol/L (ref 135–145)

## 2022-02-07 LAB — CBC W/ AUTO DIFF
BASOPHILS ABSOLUTE COUNT: 0 10*9/L (ref 0.0–0.1)
BASOPHILS RELATIVE PERCENT: 0.7 %
EOSINOPHILS ABSOLUTE COUNT: 0.2 10*9/L (ref 0.0–0.5)
EOSINOPHILS RELATIVE PERCENT: 3.7 %
HEMATOCRIT: 42 % (ref 39.0–48.0)
HEMOGLOBIN: 13.8 g/dL (ref 12.9–16.5)
LYMPHOCYTES ABSOLUTE COUNT: 2.2 10*9/L (ref 1.1–3.6)
LYMPHOCYTES RELATIVE PERCENT: 37.3 %
MEAN CORPUSCULAR HEMOGLOBIN CONC: 32.9 g/dL (ref 32.0–36.0)
MEAN CORPUSCULAR HEMOGLOBIN: 27.9 pg (ref 25.9–32.4)
MEAN CORPUSCULAR VOLUME: 85 fL (ref 77.6–95.7)
MEAN PLATELET VOLUME: 8.7 fL (ref 6.8–10.7)
MONOCYTES ABSOLUTE COUNT: 0.5 10*9/L (ref 0.3–0.8)
MONOCYTES RELATIVE PERCENT: 8.1 %
NEUTROPHILS ABSOLUTE COUNT: 3 10*9/L (ref 1.8–7.8)
NEUTROPHILS RELATIVE PERCENT: 50.2 %
NUCLEATED RED BLOOD CELLS: 0 /100{WBCs} (ref <=4)
PLATELET COUNT: 250 10*9/L (ref 150–450)
RED BLOOD CELL COUNT: 4.94 10*12/L (ref 4.26–5.60)
RED CELL DISTRIBUTION WIDTH: 14.4 % (ref 12.2–15.2)
WBC ADJUSTED: 6 10*9/L (ref 3.6–11.2)

## 2022-02-07 LAB — MAGNESIUM: MAGNESIUM: 1.7 mg/dL (ref 1.6–2.6)

## 2022-02-07 LAB — PHOSPHORUS: PHOSPHORUS: 2.3 mg/dL — ABNORMAL LOW (ref 2.4–5.1)

## 2022-02-08 LAB — CMV DNA, QUANTITATIVE, PCR: CMV VIRAL LD: NOT DETECTED

## 2022-02-08 LAB — TACROLIMUS LEVEL, TROUGH: TACROLIMUS, TROUGH: 8 ng/mL (ref 5.0–15.0)

## 2022-02-10 DIAGNOSIS — Z940 Kidney transplant status: Principal | ICD-10-CM

## 2022-02-10 NOTE — Unmapped (Signed)
Reviewed labs with Dr. Elvera Maria, tac trough resulted 8.0. Goal 5-7. Spoke with patient, trough is roughly 16 hours.     Reduce tacrolimus from 3 mg BID to 3 mg AM/ 2 mg PM per Dr. Elvera Maria. Patient verbalized understanding, no other needs at this time.

## 2022-02-19 ENCOUNTER — Ambulatory Visit: Admit: 2022-02-19 | Discharge: 2022-02-20 | Payer: BLUE CROSS/BLUE SHIELD

## 2022-02-19 DIAGNOSIS — Z940 Kidney transplant status: Principal | ICD-10-CM

## 2022-02-19 LAB — CBC W/ AUTO DIFF
BASOPHILS ABSOLUTE COUNT: 0 10*9/L (ref 0.0–0.1)
BASOPHILS RELATIVE PERCENT: 0.8 %
EOSINOPHILS ABSOLUTE COUNT: 0.1 10*9/L (ref 0.0–0.5)
EOSINOPHILS RELATIVE PERCENT: 2.5 %
HEMATOCRIT: 43.3 % (ref 39.0–48.0)
HEMOGLOBIN: 14.3 g/dL (ref 12.9–16.5)
LYMPHOCYTES ABSOLUTE COUNT: 2 10*9/L (ref 1.1–3.6)
LYMPHOCYTES RELATIVE PERCENT: 36.4 %
MEAN CORPUSCULAR HEMOGLOBIN CONC: 33.1 g/dL (ref 32.0–36.0)
MEAN CORPUSCULAR HEMOGLOBIN: 28.2 pg (ref 25.9–32.4)
MEAN CORPUSCULAR VOLUME: 85.2 fL (ref 77.6–95.7)
MEAN PLATELET VOLUME: 8.6 fL (ref 6.8–10.7)
MONOCYTES ABSOLUTE COUNT: 0.5 10*9/L (ref 0.3–0.8)
MONOCYTES RELATIVE PERCENT: 9.6 %
NEUTROPHILS ABSOLUTE COUNT: 2.8 10*9/L (ref 1.8–7.8)
NEUTROPHILS RELATIVE PERCENT: 50.7 %
PLATELET COUNT: 253 10*9/L (ref 150–450)
RED BLOOD CELL COUNT: 5.08 10*12/L (ref 4.26–5.60)
RED CELL DISTRIBUTION WIDTH: 15.3 % — ABNORMAL HIGH (ref 12.2–15.2)
WBC ADJUSTED: 5.5 10*9/L (ref 3.6–11.2)

## 2022-02-19 LAB — COMPREHENSIVE METABOLIC PANEL
ALBUMIN: 3.7 g/dL (ref 3.4–5.0)
ALKALINE PHOSPHATASE: 86 U/L (ref 46–116)
ALT (SGPT): 22 U/L (ref 10–49)
ANION GAP: 10 mmol/L (ref 5–14)
AST (SGOT): 24 U/L (ref <=34)
BILIRUBIN TOTAL: 0.4 mg/dL (ref 0.3–1.2)
BLOOD UREA NITROGEN: 18 mg/dL (ref 9–23)
BUN / CREAT RATIO: 12
CALCIUM: 9.6 mg/dL (ref 8.7–10.4)
CHLORIDE: 109 mmol/L — ABNORMAL HIGH (ref 98–107)
CO2: 20.2 mmol/L (ref 20.0–31.0)
CREATININE: 1.46 mg/dL — ABNORMAL HIGH
EGFR CKD-EPI (2021) MALE: 59 mL/min/{1.73_m2} — ABNORMAL LOW (ref >=60)
GLUCOSE RANDOM: 143 mg/dL — ABNORMAL HIGH (ref 70–99)
POTASSIUM: 4.6 mmol/L (ref 3.4–4.8)
PROTEIN TOTAL: 8.1 g/dL (ref 5.7–8.2)
SODIUM: 139 mmol/L (ref 135–145)

## 2022-02-19 LAB — MAGNESIUM: MAGNESIUM: 1.5 mg/dL — ABNORMAL LOW (ref 1.6–2.6)

## 2022-02-19 LAB — PHOSPHORUS: PHOSPHORUS: 2.7 mg/dL (ref 2.4–5.1)

## 2022-02-19 LAB — CMV DNA, QUANTITATIVE, PCR: CMV VIRAL LD: NOT DETECTED

## 2022-02-20 DIAGNOSIS — Z940 Kidney transplant status: Principal | ICD-10-CM

## 2022-02-20 LAB — TACROLIMUS LEVEL, TROUGH: TACROLIMUS, TROUGH: 9.5 ng/mL (ref 5.0–15.0)

## 2022-02-20 NOTE — Unmapped (Signed)
Reviewed labs with Dr. Toni Arthurs, recent tac trough 9, goal 5-7. Recommends to decrease tacrolimus 2 mg BID. Patient agreed to plan and will repeat labs at appt on 03/07/22. No other needs at this time.

## 2022-02-26 NOTE — Unmapped (Signed)
Confidential Psychology Telephone Lexicographer reached out to patient to discuss mental health resources, as he had contacted his coordinator looking for help with anxiety symptoms. Pt reported he is interested in engaging in therapy. Writer reviewed various resources with pt. Pt made an appt with [facility] for next week to discuss options. Writer encouraged pt to discuss a referral with his current PCP and writer will send list of available providers/clinics via MyChart. Pt expressed gratitude for the call. Writer provided pt with direct office line to reach back out should he have trouble getting connected to a local provider.     Deedra Ehrich, PsyD  Clinical Psychologist

## 2022-03-01 NOTE — Unmapped (Signed)
[facility] [place]  New Patient Clinic Note    Assessment/Plan:   Mr.Ulbricht is a 47 y.o.male    Problem List Items Addressed This Visit          Endocrine    Type 1 diabetes mellitus with stage 3a chronic kidney disease (CMS-HCC)     28M with T1DM (A1c 6.9%, 07/2021) s/p kidney transplant (2017, on tacrolimus and myfortic) c/b CKD3a and chart history of diabetic retinopathy but patient said ophtho refutes this. Following with [facility] and [facility].   Current regimen of NPH 20U BID, Humalog 8U with SSI  Home BS:   Assessment: Well-controlled T1DM (A1c < 7%).   Plan  - Due for POCT A1c check today.  - Continue current regimen; followed by endocrinology            Cardiovascular and Mediastinum    Hypertension - Primary (Chronic)     28M with hx of stroke here for hypertension evaluation. Contributors: tacrolimus, obesity. Current regimen: amlodipine 10mg , carvedilol 25, olmesartan 20 daily. Asymptomatic  BP at home: below systolic 130s. Clinic BP 133/89. BP goal: < 130 / < 80  Plan  - Continue current regimen; also being monitored by nephrology              Other    Mixed anxiety and depressive disorder     28M with significant life stressors (family deaths, relationship stress, significant medical illness) here for mood symptoms. He is hoping to be put in touch with a therapist to help with symptoms. His life stressors have piled up in the past decade affecting his ability to function. PHQ-9 = 9; GAD7 = 11. He wants to start with therapy first and then consider medications if not working  - Spoke to SW at today's visit; will connect with resources  - Fu in 1-2 months to consider starting therapy.         Screening for colon cancer    Relevant Orders    Colonoscopy       Attending: Dr. Cammie Mcgee    Subjective   Mr. Caponi is a 47 y.o. male  coming to clinic today for the following issues:    Chief Complaint   Patient presents with    Establish Care    Mental Health Problem HPI:    #Anxiety symptoms. PHQ 9 = 9; GAD7 = 11. Dealing with a lot of stress with girlfriend.     #s/p kidney transplant 2/2 T1DM (2017) on tacrolimus and myfortic.     #BP. On amlodipine 10, carvedilol 25, and olmesartan 20 daily  - OSA screen: snores but no gasping for air,    #Hx of stroke. On atorva 40, ASA 81mg  daily. Reported residual left sided motor weakness.     #T1DM on NPH 20U BID, humalog 8U with correction. Seeing endocrinology.     #Weight management. Not on Wegovy 0.25mg  weekly. Messing up kidney numbers.     ROS: The balance of the remaining 10 systems reviewed are negative.    I have reviewed the problem list, medications, and allergies and have updated/reconciled them if needed.    HISTORY:    Past Medical History:   Diagnosis Date    CVA (cerebral vascular accident) (CMS-HCC) 10/2013    Diabetes mellitus (CMS-HCC)     Type 1    Diabetic ketoacidosis associated with type 1 diabetes mellitus (CMS-HCC) 02/28/2016    ESRD (end stage renal disease) (CMS-HCC)  HTN (hypertension)      Past Surgical History:   Procedure Laterality Date    AV FISTULA PLACEMENT      PERITONEAL CATHETER INSERTION      PERITONEAL CATHETER REMOVAL      PR TRANSPLANTATION OF KIDNEY Left 01/28/2016    Procedure: RENAL ALLOTRANSPLANTATION, IMPLANTATION OF GRAFT; WITHOUT RECIPIENT NEPHRECTOMY;  Surgeon: Leona Carry, MD;  Location: MAIN OR [facility];  Service: Transplant    REFRACTIVE SURGERY Bilateral       No Known Allergies  Social History     Tobacco Use    Smoking status: Former     Current packs/day: 0.00     Average packs/day: 0.5 packs/day for 22.0 years (11.0 ttl pk-yrs)     Types: Cigarettes     Start date: 10/26/1991     Quit date: 10/25/2013     Years since quitting: 8.3    Smokeless tobacco: Never   Substance Use Topics    Alcohol use: No     Alcohol/week: 0.0 standard drinks of alcohol    Drug use: No     Family History   Problem Relation Age of Onset    Stroke Mother     ALS Father        Health Maintenance   Topic Date Due    Pneumococcal Vaccine 0-64 (1 - PCV) Never done    Foot Exam  Never done    Retinal Eye Exam  Never done    Meningococcal B Vaccines (1 of 4 - Increased Risk) Never done    DTaP/Tdap/Td Vaccines (1 - Tdap) Never done    Colon Cancer Screening  Never done    COVID-19 Vaccine (6 - 2023-24 season) 02/13/2022    Hemoglobin A1c  09/01/2022    Serum Creatinine Monitoring  02/20/2023    Potassium Monitoring  02/20/2023    Hepatitis C Screen  Completed    Influenza Vaccine  Completed       Objective     Vitals: BP 133/89 (BP Site: R Arm, BP Position: Sitting, BP Cuff Size: Large)  - Pulse 76  - Temp 36.7 ??C (98.1 ??F) (Temporal)  - Ht 186 cm (6' 1.23)  - Wt (!) 130.2 kg (287 lb)  - BMI 37.63 kg/m??     Physical Exam  Vitals reviewed.   Constitutional:       Appearance: Normal appearance.   Neurological:      Mental Status: He is alert and oriented to person, place, and time.      Comments: Reporting residual left sided weakness but normal and equal grip/knee extension strength bilaterally.         Labs/Imaging:  I have reviewed pertinent recent labs and imaging in Epic    [facility]  [place] [facility]  [address], [place], [place] [address]  Telephone [phone]  Fax [phone]  [email or web address]

## 2022-03-02 NOTE — Unmapped (Addendum)
21M with hx of stroke here for hypertension evaluation. Contributors: tacrolimus, obesity. Current regimen: amlodipine 10mg , carvedilol 25, olmesartan 20 daily. Asymptomatic  BP at home: below systolic 130s. Clinic BP 133/89. BP goal: < 130 / < 80  Plan  - Continue current regimen; also being monitored by nephrology

## 2022-03-02 NOTE — Unmapped (Addendum)
77M with T1DM (A1c 6.9%, 07/2021) s/p kidney transplant (2017, on tacrolimus and myfortic) c/b CKD3a and chart history of diabetic retinopathy but patient said ophtho refutes this. Following with [facility] and [facility].   Current regimen of NPH 20U BID, Humalog 8U with SSI  Home BS:   Assessment: Well-controlled T1DM (A1c < 7%).   Plan  - Due for POCT A1c check today.  - Continue current regimen; followed by endocrinology

## 2022-03-03 ENCOUNTER — Ambulatory Visit: Admit: 2022-03-03 | Discharge: 2022-03-04 | Payer: BLUE CROSS/BLUE SHIELD

## 2022-03-03 DIAGNOSIS — N1831 Type 1 diabetes mellitus with stage 3a chronic kidney disease (CMS-HCC): Principal | ICD-10-CM

## 2022-03-03 DIAGNOSIS — N186 End stage renal disease: Principal | ICD-10-CM

## 2022-03-03 DIAGNOSIS — I159 Secondary hypertension, unspecified: Principal | ICD-10-CM

## 2022-03-03 DIAGNOSIS — E1022 Type 1 diabetes mellitus with diabetic chronic kidney disease: Principal | ICD-10-CM

## 2022-03-03 DIAGNOSIS — Z79899 Other long term (current) drug therapy: Principal | ICD-10-CM

## 2022-03-03 DIAGNOSIS — Z940 Kidney transplant status: Principal | ICD-10-CM

## 2022-03-03 DIAGNOSIS — Z1211 Encounter for screening for malignant neoplasm of colon: Principal | ICD-10-CM

## 2022-03-03 DIAGNOSIS — T8619 Other complication of kidney transplant: Principal | ICD-10-CM

## 2022-03-03 DIAGNOSIS — F418 Other specified anxiety disorders: Principal | ICD-10-CM

## 2022-03-03 NOTE — Unmapped (Addendum)
GI appointments (colonoscopy, etc.) - [phone]    [email or web address]

## 2022-03-03 NOTE — Unmapped (Signed)
Care Management Progress Note  [facility]             Purpose of contact:         SW met with patient in clinic at PCP request to discuss resources for therapy.     Additional Information/Plan:  Patient talked with SW about current life stressors and family and health history. He is ready to process all that he has been through. Discussed options for therapy and how to find a therapist. SW sent MyChart message with links to resources discussed.     Patient provided my direct contact information and encouraged to contact me should additional needs arise.    Emmalynne Courtney, LCSW, CCM  Population Health  [facility]  Ph: [phone]

## 2022-03-03 NOTE — Unmapped (Signed)
31M with significant life stressors (family deaths, relationship stress, significant medical illness) here for mood symptoms. He is hoping to be put in touch with a therapist to help with symptoms. His life stressors have piled up in the past decade affecting his ability to function. PHQ-9 = 9; GAD7 = 11. He wants to start with therapy first and then consider medications if not working  - Spoke to SW at today's visit; will connect with resources  - Fu in 1-2 months to consider starting therapy.

## 2022-03-03 NOTE — Unmapped (Signed)
[facility]   TRANSPLANT FOLLOW UP     PCP: Roda Shutters, Family M   Kidney transplant coordinator: Artis Flock    Date of Visit at Transplant clinic: 03/03/2022     Assessment/Recommendations:     # s/p deceased donor kidney transplant 01/28/2016   Native kidney disease T1DM.  Graft function 1.4-1.8  DSAs: none detected 04/02/21    # Immunosuppression  Tacrolimus (Prograf) 2 mg BID, goal trough 5-7 ng/mL  Mycophenolate (Myfortic) 360 mg BID    # Acute issues today  Depression: following up with a counselor, he is pleased with his progress, no indication for meds now    # BP management   Goal 130/80, as tolerated.  - amlodipine 10 mg daily  - carvedilol 25 mg daily  - olmesartan 20 mg daily    # Infectious disease  CMV D+/R-, EBV D+/R+  CMV ppx:    PJP ppx:    # Anemia screening  Hb 14.3    # Cardiovascular: primary CAD prevention  # History of stroke Sept 2015 due to uncontrolled HTN - LLE weakness, treated at [facility], with full recovery.  The 10-year ASCVD risk score (Arnett DK, et al., 2019) is: 13.2%  Atorvastatin 40 mg daily  ASA 81 mg daily    # CKD-BMD  Ca and phos normal  PTH 99.6 07/31/21    # Electrolytes  Mg 1.5 off supp  K acceptable    # Comorbidities   Type 1 DM: HbA1c 6.9 on 07/31/21.   - NPH 20 units BID  - Humalog 8 units + correction with meals  - he sees an endrocrinologist for T1DM    Weight management:  - had weight loss benefit with Trulicity, but stopped due to diarrhea with increased dose  - stopped Ozempic because of GI symptoms  - follows up with nutritionist Greta Doom) and weight management clinic for weight loss    # Immunizations  - Recommended the Shingrix vaccine  - Prevnar-20 today  Immunization History   Administered Date(s) Administered    COVID-19 VACCINE,MRNA(MODERNA)(PF) 04/27/2019, 06/01/2019, 11/01/2019, 06/27/2020    Covid-19 Vac, (98yr+) (Spikevax) Monovalent Xbb.1.5 Moder  12/19/2021    Influenza Vaccine Quad(IM)6 MO-Adult(PF) 03/03/2017, 10/27/2017, 11/25/2018, 01/04/2020, 02/27/2021, 12/05/2021    Influenza Virus Vaccine, unspecified formulation 11/09/2015, 03/03/2017, 10/27/2017       # Cancer screening  Colonoscopy: scheduled for 03/31/22  Skin: recommend yearly dermatology evaluation    # Follow up:  Labs monthly  Visits return in 4 months      Kidney Transplant History:   Date of Transplant: 01/28/2016 (Kidney)  Type of Transplant: DDKT, DCD  KDPI: 89%  Cold ischemic time: 1,046 minutes (17hr )  Warm ischemic time: 49 minutes  cPRA: 0%  HLA match:   Blood type: Donor O, Recipient O POS  ID: CMV D+/R-, EBV D+/R+  Native Kidney Disease: T1DM   Native kidney biopsy: no   Pre-transplant dialysis course: HD since 12/08/2012  Post-Transplant Course:    Delayed graft function requiring dialysis: Yes   Other complications: no  Prior Transplants: None  Induction: Campath  Early steroid withdrawal: Yes    Biopsies:   Zero-Hour Biopsy: Moderate to severe arteriosclerosis observed in some of the vascular cross sections; Mild acute tubular injury.    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 He believes that the underlying cause for it is his fathers sudden death from ALS, his mothers health, and his own diagnosis of stage 5 CKD.   - denies having suicidal ideation    Concerns about nonadherence: No    Social: Lives in [place].    Review of Systems:   A 12-system review was negative except as documented in the HPI.    Physical Exam:     BP (!) 131/93 (BP Site: R Arm, BP Position: Sitting, BP Cuff Size: X-Large)  - Pulse 82  - Temp 36.7 ??C (98 ??F) (Temporal)  - Ht 185.4 cm (6' 1)  - Wt 130.2 kg (287 lb)  - BMI 37.87 kg/m??   Constitutional:  Well-appearing in NAD  Eyes:  anicteric sclerae  ENT:  MMM  CV:  RRR, no m/r/g, no JVD, extremities WWP with no edema  Resp:  Good air movement, CTAB  GI:  Abdomen soft, NTND, +bs  MSK:  Grossly normal, exam is limited  Skin:  Normal turgor, no rash  Neuro:  Grossly normal, exam is limited  Psych:  Normal affect    Allergies: No Known Allergies     Current Medications:   Current Outpatient Medications   Medication Sig Dispense Refill    acetaminophen (TYLENOL) 325 MG tablet Take 1-2 tablets (325-650 mg total) by mouth every four (4) hours as needed for pain. 100 tablet 0    amLODIPine (NORVASC) 10 MG tablet Take 1 tablet (10 mg total) by mouth daily. 90 tablet 3    aspirin (ECOTRIN) 81 MG tablet Take 1 tablet (81 mg total) by mouth daily. 30 tablet 11    atorvastatin (LIPITOR) 40 MG tablet Take 1 tablet (40 mg total) by mouth every evening. 90 tablet 3    blood sugar diagnostic Strp Test Blood Sugar four times a day; E11.9 120 strip 5    carvediloL (COREG) 25 MG tablet Take 1 tablet (25 mg total) by mouth Two (2) times a day. 180 tablet 3    cholecalciferol, vitamin D3, (VITAMIN D3) 25 mcg (1,000 unit) Chew Chew 1 tablet daily.      insulin lispro (HUMALOG) 100 unit/mL injection pen Inject 8 Units under the skin Three (3) times a day before meals. Dx code: E11.9 Diabetes (Patient taking differently: Inject 8 Units under the skin continuous. Dx code: E11.9 Diabetes) 15 mL 5    insulin syringe-needle U-100 (BD VEO INSULIN SYRINGE UF) 1/2 mL 31 gauge x 15/64 (6 mm) Syrg CHECK BLOOD SUGAR 4 TIMES DAILY. 100 each 11    mycophenolate (MYFORTIC) 180 MG EC tablet Take 2 tablets (360 mg total) by mouth Two (2) times a day. 360 tablet 3    olmesartan (BENICAR) 20 MG tablet Take 1 tablet (20 mg total) by mouth daily. 90 tablet 3    omeprazole (PRILOSEC) 20 MG capsule Take 1 capsule (20 mg total) by mouth 1 (one) time each day (Patient not taking: Reported on 12/19/2021) 90 capsule 3    omeprazole (PRILOSEC) 40 MG capsule Take 1 capsule (40 mg total) by mouth daily. 90 capsule 3  semaglutide 0.25 mg or 0.5 mg (2 mg/3 mL) PnIj Inject 0.25 mg under the skin every seven (7) days for 28 days, THEN 0.5 mg every seven (7) days. 9 mL 0    sodium bicarbonate 650 mg tablet Take 1 tablet (650 mg total) by mouth Two (2) times a day. 100 tablet 5    subcutaneous insulin pump Misc by Miscellaneous route continuous.      tacrolimus (PROGRAF) 1 MG capsule Take 2 capsules (2 mg total) in the morning and 2 capsules (2 mg total) at night, daily. 540 capsule 3     No current facility-administered medications for this visit.       Past Medical History:   Past Medical History:   Diagnosis Date    CVA (cerebral vascular accident) (CMS-HCC) 10/2013    Diabetes mellitus (CMS-HCC)     Type 1    Diabetic ketoacidosis associated with type 1 diabetes mellitus (CMS-HCC) 02/28/2016    ESRD (end stage renal disease) (CMS-HCC)     HTN (hypertension)         Laboratory studies:   Reviewed recent results.    Scribe's Attestation: Dewitt Hoes, MD obtained and performed the history, physical exam and medical decision making elements that were entered into the chart.  Signed by Charlotte Crumb, Scribe, on March 07, 2022 at 9:11 AM.    Documentation assistance provided by the Scribe. I was present during the time the encounter was recorded. The information recorded by the Scribe was done at my direction and has been reviewed and validated by me.  Leafy Half, MD

## 2022-03-04 MED ORDER — PEG 3350-ELECTROLYTES 236 GRAM-22.74 GRAM-6.74 GRAM-5.86 GRAM SOLUTION
Freq: Once | ORAL | 0 refills | 1 days | Status: CP
Start: 2022-03-04 — End: 2022-03-04

## 2022-03-04 NOTE — Unmapped (Signed)
Colonoscopy  Procedure #1     Procedure #2   [id number]  MRN   GENERIC   Endoscopist     Is the patient's health insurance [employer], [facility]), or [employer]?     Urgent procedure     Are you pregnant?     Are you in the process of scheduling or awaiting results of a heart ultrasound, stress test, or catheterization to evaluate new or worsening chest pain, dizziness, or shortness of breath?     Do you take: Plavix (clopidogrel), Coumadin (warfarin), Lovenox (enoxaparin), Pradaxa (dabigatran), Effient (prasugrel), Xarelto (rivaroxaban), Eliquis (apixaban), Pletal (cilostazol), or Brilinta (ticagrelor)?          Which of the above medications are you taking?          What is the name of the medical practice that manages this medication?          What is the name of the medical provider who manages this medication?     Do you have hemophilia, von Willebrand disease, or low platelets?     Do you have a pacemaker or implanted cardiac defibrillator?     Has a [place] GI provider specified the location(s)?     Which location(s) did the [facility] provider specify?        [facility]        [place]        [facility]        HMOB-Mod Sedation     Is procedure indication for variceal banding (this does NOT include variceal screening)?     Have you had a heart attack, stroke or heart stent placement within the past 6 months?     Month of event     Year of event (ONLY ENTER LAST 2 DIGITS)        6  Height (feet)   1  Height (inches)   287  Weight (pounds)   37.9  BMI          Did the ordering provider specify a bowel prep?          What bowel prep was specified?   TRUE  Do you have chronic kidney disease?     Do you have chronic constipation or have you had poor quality bowel preps for past colonoscopies?     Do you have Crohn's disease or ulcerative colitis?     Have you had weight loss surgery?          When you walk around your house or grocery store, do you have to stop and rest due to shortness of breath, chest pain, or light-headedness?     Do you ever use supplemental oxygen?     Have you been hospitalized for cirrhosis of the liver or heart failure in the last 12 months?     Have you been treated for mouth or throat cancer with radiation or surgery?     Have you been told that it is difficult for doctors to insert a breathing tube in you during anesthesia?     Have you had a heart or lung transplant?          Are you on dialysis?     Do you have cirrhosis of the liver?     Do you have myasthenia gravis?     Is the patient a prisoner?          Have you been diagnosed with sleep apnea or do you wear  a CPAP machine at night?     Are you younger than 30?     Have you previously received propofol sedation administered by an anesthesiologist for a GI procedure?     Do you drink an average of more than 3 drinks of alcohol per day?     Do you regularly take suboxone or any prescription medications for chronic pain?     Do you regularly take Ativan, Klonopin, Xanax, Valium, lorazepam, clonazepam, alprazolam, or diazepam?     Have you previously had difficulty with sedation during a GI procedure?     Have you been diagnosed with PTSD?     Are you allergic to fentanyl or midazolam (Versed)?     Do you take medications for HIV?   ################# ## ###################################################################################################################   MRN:          [id number]   Anticoag Review:  No   Nurse Triage:  No   GI Clinic Consult:  No   Procedure(s):  Colonoscopy     0   [facility]  [facility]     [facility]     [place]     HMOB-Mod Sed   Endoscopist:  GENERIC    Urgent:            No   Prep:               Nulytely Prep                  ################# ## ###################################################################################################################

## 2022-03-05 NOTE — Unmapped (Signed)
Immediately after or during the visit, I reviewed with the resident the medical history and the resident???s findings on physical examination.  I discussed with the resident the patient???s diagnosis and concur with the treatment plan as documented in the resident note. Sunny Schlein, MD

## 2022-03-07 ENCOUNTER — Ambulatory Visit: Admit: 2022-03-07 | Discharge: 2022-03-07 | Payer: BLUE CROSS/BLUE SHIELD

## 2022-03-07 DIAGNOSIS — Z23 Encounter for immunization: Principal | ICD-10-CM

## 2022-03-07 DIAGNOSIS — Z940 Kidney transplant status: Principal | ICD-10-CM

## 2022-03-07 DIAGNOSIS — D849 Immunodeficiency, unspecified: Principal | ICD-10-CM

## 2022-03-07 DIAGNOSIS — Z79899 Other long term (current) drug therapy: Principal | ICD-10-CM

## 2022-03-07 LAB — LIPID PANEL
CHOLESTEROL/HDL RATIO SCREEN: 4.1 (ref 1.0–4.5)
CHOLESTEROL: 114 mg/dL (ref <=200)
HDL CHOLESTEROL: 28 mg/dL — ABNORMAL LOW (ref 40–60)
LDL CHOLESTEROL CALCULATED: 52 mg/dL (ref 40–99)
NON-HDL CHOLESTEROL: 86 mg/dL (ref 70–130)
TRIGLYCERIDES: 171 mg/dL — ABNORMAL HIGH (ref 0–150)
VLDL CHOLESTEROL CAL: 34.2 mg/dL (ref 11–50)

## 2022-03-07 LAB — ALBUMIN / CREATININE URINE RATIO
ALBUMIN QUANT URINE: 2.6 mg/dL
ALBUMIN/CREATININE RATIO: 15.5 ug/mg (ref 0.0–30.0)
CREATININE, URINE: 167.7 mg/dL

## 2022-03-07 LAB — CBC W/ AUTO DIFF
BASOPHILS ABSOLUTE COUNT: 0 10*9/L (ref 0.0–0.1)
BASOPHILS RELATIVE PERCENT: 0.4 %
EOSINOPHILS ABSOLUTE COUNT: 0.2 10*9/L (ref 0.0–0.5)
EOSINOPHILS RELATIVE PERCENT: 3 %
HEMATOCRIT: 42.8 % (ref 39.0–48.0)
HEMOGLOBIN: 14.1 g/dL (ref 12.9–16.5)
LYMPHOCYTES ABSOLUTE COUNT: 2.1 10*9/L (ref 1.1–3.6)
LYMPHOCYTES RELATIVE PERCENT: 39.2 %
MEAN CORPUSCULAR HEMOGLOBIN CONC: 33 g/dL (ref 32.0–36.0)
MEAN CORPUSCULAR HEMOGLOBIN: 28.2 pg (ref 25.9–32.4)
MEAN CORPUSCULAR VOLUME: 85.3 fL (ref 77.6–95.7)
MEAN PLATELET VOLUME: 9.3 fL (ref 6.8–10.7)
MONOCYTES ABSOLUTE COUNT: 0.4 10*9/L (ref 0.3–0.8)
MONOCYTES RELATIVE PERCENT: 7.6 %
NEUTROPHILS ABSOLUTE COUNT: 2.6 10*9/L (ref 1.8–7.8)
NEUTROPHILS RELATIVE PERCENT: 49.8 %
NUCLEATED RED BLOOD CELLS: 0 /100{WBCs} (ref <=4)
PLATELET COUNT: 258 10*9/L (ref 150–450)
RED BLOOD CELL COUNT: 5.01 10*12/L (ref 4.26–5.60)
RED CELL DISTRIBUTION WIDTH: 14.7 % (ref 12.2–15.2)
WBC ADJUSTED: 5.3 10*9/L (ref 3.6–11.2)

## 2022-03-07 LAB — URINALYSIS WITH MICROSCOPY
BILIRUBIN UA: NEGATIVE
BLOOD UA: NEGATIVE
GLUCOSE UA: NEGATIVE
KETONES UA: NEGATIVE
LEUKOCYTE ESTERASE UA: NEGATIVE
NITRITE UA: NEGATIVE
PH UA: 6 (ref 5.0–9.0)
PROTEIN UA: NEGATIVE
RBC UA: 1 /HPF (ref <3)
SPECIFIC GRAVITY UA: 1.02 (ref 1.005–1.030)
SQUAMOUS EPITHELIAL: 1 /HPF (ref 0–5)
UROBILINOGEN UA: 0.2
WBC UA: 2 /HPF — ABNORMAL HIGH (ref <2)

## 2022-03-07 LAB — COMPREHENSIVE METABOLIC PANEL
ALBUMIN: 3.7 g/dL (ref 3.4–5.0)
ALKALINE PHOSPHATASE: 121 U/L — ABNORMAL HIGH (ref 46–116)
ALT (SGPT): 26 U/L (ref 10–49)
ANION GAP: 8 mmol/L (ref 5–14)
BILIRUBIN TOTAL: 0.3 mg/dL (ref 0.3–1.2)
BLOOD UREA NITROGEN: 18 mg/dL (ref 9–23)
BUN / CREAT RATIO: 12
CALCIUM: 9.3 mg/dL (ref 8.7–10.4)
CHLORIDE: 109 mmol/L — ABNORMAL HIGH (ref 98–107)
CO2: 22.2 mmol/L (ref 20.0–31.0)
CREATININE: 1.48 mg/dL — ABNORMAL HIGH
EGFR CKD-EPI (2021) MALE: 58 mL/min/{1.73_m2} — ABNORMAL LOW (ref >=60)
GLUCOSE RANDOM: 147 mg/dL — ABNORMAL HIGH (ref 70–99)
PROTEIN TOTAL: 7.8 g/dL (ref 5.7–8.2)
SODIUM: 139 mmol/L (ref 135–145)

## 2022-03-07 LAB — PROTEIN / CREATININE RATIO, URINE
CREATININE, URINE: 167.7 mg/dL
PROTEIN URINE: 23.5 mg/dL
PROTEIN/CREAT RATIO, URINE: 0.14

## 2022-03-07 LAB — HEMOGLOBIN A1C
ESTIMATED AVERAGE GLUCOSE: 160 mg/dL
HEMOGLOBIN A1C: 7.2 % — ABNORMAL HIGH (ref 4.8–5.6)

## 2022-03-07 LAB — PARATHYROID HORMONE (PTH): PARATHYROID HORMONE INTACT: 160.5 pg/mL — ABNORMAL HIGH (ref 18.4–80.1)

## 2022-03-07 LAB — PHOSPHORUS: PHOSPHORUS: 3 mg/dL (ref 2.4–5.1)

## 2022-03-07 LAB — TACROLIMUS LEVEL, TROUGH: TACROLIMUS, TROUGH: 5.1 ng/mL (ref 5.0–15.0)

## 2022-03-07 LAB — MAGNESIUM: MAGNESIUM: 1.5 mg/dL — ABNORMAL LOW (ref 1.6–2.6)

## 2022-03-07 LAB — BILIRUBIN, DIRECT: BILIRUBIN DIRECT: 0.1 mg/dL (ref 0.00–0.30)

## 2022-03-07 MED ORDER — OMEPRAZOLE 40 MG CAPSULE,DELAYED RELEASE
ORAL_CAPSULE | Freq: Every day | ORAL | 3 refills | 90 days | Status: CP
Start: 2022-03-07 — End: 2023-03-07

## 2022-03-07 NOTE — Unmapped (Signed)
Great to see you today    Vaccines:  Prevnar 20 (given today)  Shingrix (2 dose series to protect against Shingles - please get this at a pharmacy of your choice)

## 2022-03-07 NOTE — Unmapped (Signed)
[facility] w/ [facility] in [facility] today. Reviewed meds/symptoms. Any new medications?                 Fever/cold/flu symptoms denies  BP: 131/93 today/ Home BP reported stable, 130s/80s-90s  BG: A1C elevated - follows [facility]  Headache/Dizziness/Lightheaded: denies  Hand tremors: denies  Numbness/tingling: denies  Fevers/chills/sweats: denies  CP/SOB/palpatations: denies  Nausea/vomiting/heartburn: heartburn controlled w/ Prilosec  Diarrhea/constipation: no diarrhea since stopping Ozempic; normal stools  UTI symptoms (burn/pain/itch/frequency/urgency/odor/color/foam): denies  No visible or palpable edema     Appetite good; reports adequate hydration. 4-5 16 oz/bottles/fluid per day.     Pt reports being well rested and getting adequate exercise.     Continues to follow Covid/health safety precautions by taking care to mask, perform frequent hand hygeine and minimal public activity. Offered support and guidance for this process given their immune-suppressed state. We discussed reduced covid vaccine coverage for transplant patients and importance of continuing to mask and practice safe distancing. Commented that booster vaccines will likely be advised as an ongoing process.     Pain: denies  Drain:  Foley:  Incision:     Last Tac taken 2100; held for this morning's labs. Current dose 2mg  bid; Myfortic 360mg  bid     Other complaints or concerns: seeing a counselor for depression    Referrals needed: [facility]     Pt Follow up: PCP and [facility]     Immunization status: needs Pneumo, UTD otherwise     Functional Score: 100

## 2022-03-08 LAB — EBV QUANTITATIVE PCR, BLOOD: EBV VIRAL LOAD RESULT: NOT DETECTED

## 2022-03-08 LAB — BK VIRUS QUANTITATIVE PCR, BLOOD: BK BLOOD RESULT: NOT DETECTED

## 2022-03-08 LAB — CMV DNA, QUANTITATIVE, PCR
CMV QUANT: <35 [IU]/mL — ABNORMAL HIGH (ref <0)
CMV VIRAL LD: DETECTED — AB

## 2022-03-12 LAB — HLA DS POST TRANSPLANT
ANTI-DONOR DRW #1 MFI: 160 MFI
ANTI-DONOR DRW #2 MFI: 317 MFI
ANTI-DONOR HLA-A #1 MFI: 133 MFI
ANTI-DONOR HLA-A #2 MFI: 32 MFI
ANTI-DONOR HLA-B #1 MFI: 121 MFI
ANTI-DONOR HLA-B #2 MFI: 111 MFI
ANTI-DONOR HLA-C #1 MFI: 51 MFI
ANTI-DONOR HLA-C #2 MFI: 76 MFI
ANTI-DONOR HLA-DP #2 MFI: 169 MFI
ANTI-DONOR HLA-DQB #1 MFI: 0 MFI
ANTI-DONOR HLA-DQB #2 MFI: 86 MFI
ANTI-DONOR HLA-DR #2 MFI: 106 MFI

## 2022-03-12 LAB — VITAMIN D 25 HYDROXY: VITAMIN D, TOTAL (25OH): 25.5 ng/mL (ref 20.0–80.0)

## 2022-03-12 LAB — FSAB CLASS 2 ANTIBODY SPECIFICITY: HLA CL2 AB RESULT: NEGATIVE

## 2022-03-12 LAB — FSAB CLASS 1 ANTIBODY SPECIFICITY: HLA CLASS 1 ANTIBODY RESULT: NEGATIVE

## 2022-03-13 LAB — VITAMIN D 1,25 DIHYDROXY: VITAMIN D 1,25-DIHYDROXY: 42 pg/mL

## 2022-03-17 DIAGNOSIS — Z940 Kidney transplant status: Principal | ICD-10-CM

## 2022-03-17 MED ORDER — TACROLIMUS 1 MG CAPSULE, IMMEDIATE-RELEASE
ORAL_CAPSULE | 3 refills | 0 days
Start: 2022-03-17 — End: ?

## 2022-03-18 MED ORDER — TACROLIMUS 1 MG CAPSULE, IMMEDIATE-RELEASE
ORAL_CAPSULE | 3 refills | 0 days | Status: CP
Start: 2022-03-18 — End: ?
  Filled 2022-04-21: qty 540, 135d supply, fill #0

## 2022-03-18 NOTE — Unmapped (Signed)
Pt request for RX Refill

## 2022-03-20 NOTE — Unmapped (Signed)
Care Management Progress Note  [facility]             Purpose of contact:         Follow up on mental health resources.    Additional Information/Plan:  Patient said he has an appointment tomorrow morning at 9:00am in [facility] with a therapist. SW encouraged him to contact SW if he needs any additional resources or support.     Penelope Fittro, LCSW, CCM  Population Health  [facility]  Ph: [phone]

## 2022-03-24 NOTE — Unmapped (Signed)
[facility] Clinical Assessment & Refill Coordination Note    Patrick Vazquez, Patrick Vazquez: [birth year]  Phone: [phone] (home) [phone] (work)    All above HIPAA information was verified with patient.     Was a Nurse, learning disability used for this call? No    Specialty Medication(s):   Transplant:  mycophenolic acid 180mg  and tacrolimus 1mg      Current Outpatient Medications   Medication Sig Dispense Refill    acetaminophen (TYLENOL) 325 MG tablet Take 1-2 tablets (325-650 mg total) by mouth every four (4) hours as needed for pain. 100 tablet 0    amlodipine (NORVASC) 10 MG tablet Take 1 tablet (10 mg total) by mouth daily. 90 tablet 3    aspirin (ECOTRIN) 81 MG tablet Take 1 tablet (81 mg total) by mouth daily. 30 tablet 11    atorvastatin (LIPITOR) 40 MG tablet Take 1 tablet (40 mg total) by mouth every evening. 90 tablet 3    blood sugar diagnostic Strp Test Blood Sugar four times a day; E11.9 120 strip 5    carvediloL (COREG) 25 MG tablet Take 1 tablet (25 mg total) by mouth Two (2) times a day. 180 tablet 3    cholecalciferol, vitamin D3, (VITAMIN D3) 25 mcg (1,000 unit) Chew Chew 1 tablet daily.      insulin lispro (HUMALOG) 100 unit/mL injection pen Inject 8 Units under the skin Three (3) times a day before meals. Dx code: E11.9 Diabetes (Patient taking differently: Inject 8 Units under the skin continuous. Dx code: E11.9 Diabetes) 15 mL 5    insulin syringe-needle U-100 (BD VEO INSULIN SYRINGE UF) 1/2 mL 31 gauge x 15/64 (6 mm) Syrg CHECK BLOOD SUGAR 4 TIMES DAILY. 100 each 11    mycophenolate (MYFORTIC) 180 MG EC tablet Take 2 tablets (360 mg total) by mouth Two (2) times a day. 360 tablet 3    olmesartan (BENICAR) 20 MG tablet Take 1 tablet (20 mg total) by mouth daily. 90 tablet 3    omeprazole (PRILOSEC) 40 MG capsule Take 1 capsule (40 mg total) by mouth daily. 90 capsule 3    sodium bicarbonate 650 mg tablet Take 1 tablet (650 mg total) by mouth Two (2) times a day. 100 tablet 5    tacrolimus (PROGRAF) 1 MG capsule Take 2 capsules (2 mg total) in the morning and 2 capsules (2 mg total) at night, daily. 540 capsule 3     No current facility-administered medications for this visit.        Changes to medications: Patrick Vazquez reports no changes at this time.    No Known Allergies    Changes to allergies: No    SPECIALTY MEDICATION ADHERENCE     Mycophenolate 180mg   : 10 days of medicine on hand   Tacrolimus 1mg   : 45 days of medicine on hand       Medication Adherence    Patient reported X missed doses in the last month: 0  Specialty Medication: tacrolimus 1mg   Patient is on additional specialty medications: Yes  Additional Specialty Medications: Mycophenolate 180mg   Patient Reported Additional Medication X Missed Doses in the Last Month: 0  Patient is on more than two specialty medications: No                            Specialty medication(s) dose(s) confirmed: Regimen is correct and unchanged.     Are there any concerns with adherence? No  Adherence counseling provided? Not needed    CLINICAL MANAGEMENT AND INTERVENTION      Clinical Benefit Assessment:    Do you feel the medicine is effective or helping your condition? Yes    Clinical Benefit counseling provided? Not needed    Adverse Effects Assessment:    Are you experiencing any side effects? No    Are you experiencing difficulty administering your medicine? No    Quality of Life Assessment:    Quality of Life    Rheumatology  Oncology  Dermatology  Cystic Fibrosis          How many days over the past month did your transplant  keep you from your normal activities? For example, brushing your teeth or getting up in the morning. 0    Have you discussed this with your provider? Not needed    Acute Infection Status:    Acute infections noted within Epic:  No active infections  Patient reported infection: None    Therapy Appropriateness:    Is therapy appropriate and patient progressing towards therapeutic goals? Yes, therapy is appropriate and should be continued    DISEASE/MEDICATION-SPECIFIC INFORMATION      N/A    Solid Organ Transplant: Not Applicable    PATIENT SPECIFIC NEEDS     Does the patient have any physical, cognitive, or cultural barriers? No    Is the patient high risk? Yes, patient is taking a REMS drug. Medication is dispensed in compliance with REMS program    Did the patient require a clinical intervention? No    Does the patient require physician intervention or other additional services (i.e., nutrition, smoking cessation, social work)? No    SOCIAL DETERMINANTS OF HEALTH     At the [facility], we have learned that life circumstances - like trouble affording food, housing, utilities, or transportation can affect the health of many of our patients.   That is why we wanted to ask: are you currently experiencing any life circumstances that are negatively impacting your health and/or quality of life? Patient declined to answer    Social Determinants of Health     Financial Resource Strain: Not on file   Internet Connectivity: Not on file   Food Insecurity: Not on file   Tobacco Use: Medium Risk (03/07/2022)    Patient History     Smoking Tobacco Use: Former     Smokeless Tobacco Use: Never     Passive Exposure: Not on file   Housing/Utilities: Not on file   Alcohol Use: Not on file   Transportation Needs: Not on file   Substance Use: Not on file   Health Literacy: Not on file   Physical Activity: Not on file   Interpersonal Safety: Not on file   Stress: Not on file   Intimate Partner Violence: Not on file   Depression: At risk (03/03/2022)    PHQ-2     PHQ-2 Score: 3   Social Connections: Not on file       Would you be willing to receive help with any of the needs that you have identified today? Not applicable       SHIPPING     Specialty Medication(s) to be Shipped:   Transplant:  mycophenolic acid 180mg     Other medication(s) to be shipped: No additional medications requested for fill at this time  Patient requested call back in 1 month for all other meds     Changes to insurance: No    Delivery  Scheduled: Yes, Expected medication delivery date: 03/31/2022.     Medication will be delivered via UPS to the confirmed prescription address in [facility]    The patient will receive a drug information handout for each medication shipped and additional FDA Medication Guides as required.  Verified that patient has previously received a Conservation officer, historic buildings and a Surveyor, mining.    The patient or caregiver noted above participated in the development of this care plan and knows that they can request review of or adjustments to the care plan at any time.      All of the patient's questions and concerns have been addressed.    Thad Ranger, PharmD   [facility] Specialty Pharmacist

## 2022-03-25 ENCOUNTER — Ambulatory Visit: Admit: 2022-03-25 | Discharge: 2022-03-26 | Payer: BLUE CROSS/BLUE SHIELD

## 2022-03-25 DIAGNOSIS — D849 Immunodeficiency, unspecified: Principal | ICD-10-CM

## 2022-03-25 DIAGNOSIS — Z940 Kidney transplant status: Principal | ICD-10-CM

## 2022-03-25 MED FILL — AMLODIPINE 10 MG TABLET: ORAL | 90 days supply | Qty: 90 | Fill #1

## 2022-03-25 NOTE — Unmapped (Signed)
Dermatology Note     Assessment and Plan:      Benign Lesions/ Findings:   Lentigo/Lentigines  Nevus/Nevi-Benign Appearing  - Reassurance provided regarding the benign appearance of lesions noted on exam today; no treatment is indicated in the absence of symptoms/changes.  - Reinforced importance of photoprotective strategies including liberal and frequent sunscreen use of a broad-spectrum SPF 30 or greater, use of protective clothing, and sun avoidance for prevention of cutaneous malignancy and photoaging.  Counseled patient on the importance of regular self-skin monitoring as well as routine clinical skin examinations as scheduled.     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  - Discussed increased risk of skin cancer development due to immunosuppression.  - Discussed increased risk for verrucae due to immunosuppression.  - Reviewed importance of early detection and treatment.    The patient was advised to call for an appointment should any new, changing, or symptomatic lesions develop.     RTC: Return in about 18 months (around 09/23/2023) for In-person. or sooner as needed   _________________________________________________________________      Chief Complaint     Chief Complaint   Patient presents with    Skin Check     FBSC       HPI     Patrick Vazquez is a 47 y.o. male who presents as a new patient to Dermatology for a full body skin exam. Patient reports no specific lesions or rash of concern.     The patient denies any other new or changing lesions or areas of concern.     Pertinent Past Medical History     No history of skin cancer    Problem List    None    Type I DM  ESRD with renal transplant  Chronic immunosuppression with Prograf (tacrolimus) and Myfortic since 2017    Family History:   Negative for melanoma  Positive for ALS  No history of other cancers    Past Medical History, Family History, Social History, Medication List, Allergies, and Problem List were reviewed in the rooming section of Epic.     ROS: Other than symptoms mentioned in the HPI, no fevers, chills, or other skin complaints    Physical Examination     GENERAL: Well-appearing male in no acute distress, resting comfortably.  NEURO: Alert and oriented, answers questions appropriately  PSYCH: Normal mood and affect  RESP: No increased work of breathing  SKIN (Full Skin Exam): Examination of the face, eyelids, lips, nose, ears, neck, chest, abdomen, back, arms, legs, hands, feet, palms, soles, nails was performed  - Lentigo/lentigines: Scattered pigmented macules that are brown in color and are somewhat non-uniform in shape and concentrated in the sun-exposed areas of the face, trunk, upper extremities, lower extremities, and scattered diffusely  - Nevus/nevi: Scattered well-demarcated, regular, pigmented macule(s) and/or papule(s) on the face, trunk, upper extremities, lower extremities, and scattered diffusely    All areas not commented on are within normal limits or unremarkable      (Approved Template 11/07/2019)

## 2022-03-26 MED ORDER — PEG 3350-ELECTROLYTES 236 GRAM-22.74 GRAM-6.74 GRAM-5.86 GRAM SOLUTION
Freq: Once | ORAL | 0 refills | 1 days | Status: CP
Start: 2022-03-26 — End: 2022-03-26

## 2022-03-28 MED FILL — MYCOPHENOLATE SODIUM 180 MG TABLET,DELAYED RELEASE: ORAL | 90 days supply | Qty: 360 | Fill #2

## 2022-03-28 NOTE — Unmapped (Signed)
Spoke with Mr. Patrick Vazquez regarding upcoming GI procedure on 03/31/22 at [facility] location at 1430 with an arrival time of 1330.     Verified that [facility] prep instructions had been received, reviewed and understood.      Verified prep medications ordered/received.    Reviewed information regarding holding olmesartan and short acting insulins day of procedure (until after procedure), taking half dose of long acting insulin day before procedure and holding day of procedure as well as holding vitamins/supplements the day before and day of procedure.     Reviewed low fiber diet should have started three days prior to procedure.    Reviewed LIQUID diet requirement the entire day prior to procedure and NOTHING AT ALL 2 hours prior to procedure    Reviewed driver requirement (18 or older, must be able to drive patient home, must remain within 20 minutes of facility and reachable by cell phone).    No further questions at this time.

## 2022-03-31 ENCOUNTER — Encounter
Admit: 2022-03-31 | Discharge: 2022-03-31 | Payer: BLUE CROSS/BLUE SHIELD | Attending: Anesthesiology | Primary: Anesthesiology

## 2022-03-31 ENCOUNTER — Ambulatory Visit: Admit: 2022-03-31 | Discharge: 2022-03-31 | Payer: BLUE CROSS/BLUE SHIELD

## 2022-03-31 MED ADMIN — Propofol (DIPRIVAN) injection: INTRAVENOUS | @ 21:00:00 | Stop: 2022-03-31

## 2022-03-31 MED ADMIN — lidocaine (XYLOCAINE) 20 mg/mL (2 %) injection: INTRAVENOUS | @ 20:00:00 | Stop: 2022-03-31

## 2022-03-31 MED ADMIN — Propofol (DIPRIVAN) injection: INTRAVENOUS | @ 20:00:00 | Stop: 2022-03-31

## 2022-03-31 MED ADMIN — lactated Ringers infusion: 10 mL/h | INTRAVENOUS | @ 19:00:00 | Stop: 2022-03-31

## 2022-04-13 NOTE — Unmapped (Signed)
Clinical Assessment Needed For: Dose Change  Medication: Tacrolimus  Last Fill Date/Day Supply: 01/28/22 / 90  Copay $4  Was previous dose already scheduled to fill: No    Notes to Pharmacist:

## 2022-04-16 NOTE — Unmapped (Signed)
[facility] Refill Coordination Note    Specialty Medication(s) to be Shipped:   Transplant: tacrolimus 1mg     Other medication(s) to be shipped:  atorvastatin , carvedilol and olmesartan      Patrick Vazquez, DOB: 1976-02-22  Phone: [phone] (home) [phone] (work)      All above HIPAA information was verified with patient.     Was a Nurse, learning disability used for this call? No    Completed refill call assessment today to schedule patient's medication shipment from the [facility] [phone]).  All relevant notes have been reviewed.     Specialty medication(s) and dose(s) confirmed: Regimen is correct and unchanged.   Changes to medications: Hardy reports no changes at this time.  Changes to insurance: No  New side effects reported not previously addressed with a pharmacist or physician: None reported  Questions for the pharmacist: No    Confirmed patient received a Conservation officer, historic buildings and a Surveyor, mining with first shipment. The patient will receive a drug information handout for each medication shipped and additional FDA Medication Guides as required.       DISEASE/MEDICATION-SPECIFIC INFORMATION        N/A    SPECIALTY MEDICATION ADHERENCE     Medication Adherence    Patient reported X missed doses in the last month: 0  Specialty Medication: tacrolimus 1 MG capsule (PROGRAF)  Patient is on additional specialty medications: No              Were doses missed due to medication being on hold? No    tacrolimus 1 mg: 12 days of medicine on hand       REFERRAL TO PHARMACIST     Referral to the pharmacist: Not needed      [facility]     Shipping address confirmed in Epic.     Patient was notified of new phone menu : No    Delivery Scheduled: Yes, Expected medication delivery date: 04/21/22.     Medication will be delivered via UPS to the prescription address in Epic WAM.    [name]   [facility] Specialty Technician

## 2022-04-18 NOTE — Unmapped (Signed)
Patrick Vazquez [place] 's entire shipment will be delayed as a result of insurance processor down.     I have reached out to the patient  at [phone] and communicated the delivery change. We will reschedule the medication for the delivery date that the patient agreed upon.  We have confirmed the delivery date as 04/22/2022, via ups.

## 2022-04-21 MED FILL — OLMESARTAN 20 MG TABLET: ORAL | 90 days supply | Qty: 90 | Fill #2

## 2022-04-21 MED FILL — ATORVASTATIN 40 MG TABLET: ORAL | 90 days supply | Qty: 90 | Fill #2

## 2022-04-21 MED FILL — CARVEDILOL 25 MG TABLET: ORAL | 90 days supply | Qty: 180 | Fill #2

## 2022-04-25 ENCOUNTER — Ambulatory Visit: Admit: 2022-04-25 | Discharge: 2022-04-26 | Payer: BLUE CROSS/BLUE SHIELD

## 2022-04-25 DIAGNOSIS — Z940 Kidney transplant status: Principal | ICD-10-CM

## 2022-04-25 LAB — CBC W/ AUTO DIFF
BASOPHILS ABSOLUTE COUNT: 0 10*9/L (ref 0.0–0.1)
BASOPHILS RELATIVE PERCENT: 0.9 %
EOSINOPHILS ABSOLUTE COUNT: 0.1 10*9/L (ref 0.0–0.5)
EOSINOPHILS RELATIVE PERCENT: 2.2 %
HEMATOCRIT: 44.1 % (ref 39.0–48.0)
HEMOGLOBIN: 14.6 g/dL (ref 12.9–16.5)
LYMPHOCYTES ABSOLUTE COUNT: 1.8 10*9/L (ref 1.1–3.6)
LYMPHOCYTES RELATIVE PERCENT: 33.7 %
MEAN CORPUSCULAR HEMOGLOBIN CONC: 33 g/dL (ref 32.0–36.0)
MEAN CORPUSCULAR HEMOGLOBIN: 28.1 pg (ref 25.9–32.4)
MEAN CORPUSCULAR VOLUME: 85.1 fL (ref 77.6–95.7)
MEAN PLATELET VOLUME: 8.6 fL (ref 6.8–10.7)
MONOCYTES ABSOLUTE COUNT: 0.4 10*9/L (ref 0.3–0.8)
MONOCYTES RELATIVE PERCENT: 7.8 %
NEUTROPHILS ABSOLUTE COUNT: 2.9 10*9/L (ref 1.8–7.8)
NEUTROPHILS RELATIVE PERCENT: 55.4 %
PLATELET COUNT: 232 10*9/L (ref 150–450)
RED BLOOD CELL COUNT: 5.19 10*12/L (ref 4.26–5.60)
RED CELL DISTRIBUTION WIDTH: 15 % (ref 12.2–15.2)
WBC ADJUSTED: 5.3 10*9/L (ref 3.6–11.2)

## 2022-04-25 LAB — COMPREHENSIVE METABOLIC PANEL
ALBUMIN: 3.8 g/dL (ref 3.4–5.0)
ALKALINE PHOSPHATASE: 115 U/L (ref 46–116)
ALT (SGPT): 34 U/L (ref 10–49)
ANION GAP: 9 mmol/L (ref 5–14)
AST (SGOT): 28 U/L (ref <=34)
BILIRUBIN TOTAL: 0.6 mg/dL (ref 0.3–1.2)
BLOOD UREA NITROGEN: 22 mg/dL (ref 9–23)
BUN / CREAT RATIO: 15
CALCIUM: 9.6 mg/dL (ref 8.7–10.4)
CHLORIDE: 107 mmol/L (ref 98–107)
CO2: 21.1 mmol/L (ref 20.0–31.0)
CREATININE: 1.43 mg/dL — ABNORMAL HIGH
EGFR CKD-EPI (2021) MALE: 61 mL/min/{1.73_m2} (ref >=60)
GLUCOSE RANDOM: 142 mg/dL (ref 70–179)
POTASSIUM: 4.4 mmol/L (ref 3.4–4.8)
PROTEIN TOTAL: 7.9 g/dL (ref 5.7–8.2)
SODIUM: 137 mmol/L (ref 135–145)

## 2022-04-25 LAB — PHOSPHORUS: PHOSPHORUS: 2.8 mg/dL (ref 2.4–5.1)

## 2022-04-25 LAB — TACROLIMUS LEVEL, TROUGH: TACROLIMUS, TROUGH: 4 ng/mL — ABNORMAL LOW (ref 5.0–15.0)

## 2022-04-25 LAB — MAGNESIUM: MAGNESIUM: 1.7 mg/dL (ref 1.6–2.6)

## 2022-05-02 NOTE — Unmapped (Signed)
Tacrolimus level 4.0 reviewed with Dr. Elvera Maria, will keep current dose and monitor next lab draw. No other needs at this time.

## 2022-05-05 NOTE — Unmapped (Signed)
75M with significant life stressors (family deaths, relationship stress, significant medical illness) here for mood symptoms.   He started therapy, which   PHQ-9: 9  >   GAD-7: 11 >

## 2022-05-05 NOTE — Unmapped (Incomplete)
[facility]  Established Patient Clinic Note    Assessment/Plan:   Patrick Vazquez is a 47 y.o.male    {a/p options:52763}    Attending: Dr. [name]    Subjective   Mr. Patrick Vazquez is a 47 y.o. male  coming to clinic today for the following issues:    No chief complaint on file.    HPI:    #Mood sx        I have reviewed the problem list, medications, and allergies and have updated/reconciled them if needed.    Mr. Patrick Vazquez  reports that he quit smoking about 8 years ago. His smoking use included cigarettes. He started smoking about 30 years ago. He has a 11 pack-year smoking history. He has never used smokeless tobacco.  Health Maintenance   Topic Date Due   ??? Foot Exam  Never done   ??? Retinal Eye Exam  Never done   ??? DTaP/Tdap/Td Vaccines (1 - Tdap) Never done   ??? Hemoglobin A1c  09/05/2022   ??? Serum Creatinine Monitoring  04/25/2023   ??? Potassium Monitoring  04/25/2023   ??? Colon Cancer Screening  03/31/2032   ??? Pneumococcal Vaccine 0-64  Completed   ??? Hepatitis C Screen  Completed   ??? COVID-19 Vaccine  Completed   ??? Influenza Vaccine  Completed       Objective     VITALS: There were no vitals taken for this visit.    Physical Exam    LABS/IMAGING  {Labs/Imaging options:52765}    [facility]  [place] [facility] [employer] [place] [facility]  [address], [place], [place] [address] ??? Telephone [phone] ??? Fax [phone]  [email or web address]

## 2022-05-07 DIAGNOSIS — Z940 Kidney transplant status: Principal | ICD-10-CM

## 2022-05-07 DIAGNOSIS — Z79899 Other long term (current) drug therapy: Principal | ICD-10-CM

## 2022-05-07 MED ORDER — OMEPRAZOLE 40 MG CAPSULE,DELAYED RELEASE
ORAL_CAPSULE | Freq: Every day | ORAL | 3 refills | 90 days | Status: CP
Start: 2022-05-07 — End: 2023-05-07
  Filled 2022-05-09: qty 90, 90d supply, fill #0

## 2022-06-06 NOTE — Unmapped (Signed)
[facility] Clinical Assessment & Refill Coordination Note    Patrick Vazquez Patrick Vazquez, Patrick Vazquez: 06/24/1975  Phone: [phone] (home) [phone] (work)    All above HIPAA information was verified with patient.     Was a Nurse, learning disability used for this call? No    Specialty Medication(s):   Transplant:  mycophenolic acid 180mg  and tacrolimus 1mg      Current Outpatient Medications   Medication Sig Dispense Refill    acetaminophen (TYLENOL) 325 MG tablet Take 1-2 tablets (325-650 mg total) by mouth every four (4) hours as needed for pain. 100 tablet 0    amlodipine (NORVASC) 10 MG tablet Take 1 tablet (10 mg total) by mouth daily. 90 tablet 3    aspirin (ECOTRIN) 81 MG tablet Take 1 tablet (81 mg total) by mouth daily. 30 tablet 11    atorvastatin (LIPITOR) 40 MG tablet Take 1 tablet (40 mg total) by mouth every evening. 90 tablet 3    blood sugar diagnostic Strp Test Blood Sugar four times a day; E11.9 120 strip 5    carvedilol (COREG) 25 MG tablet Take 1 tablet (25 mg total) by mouth Two (2) times a day. 180 tablet 3    cholecalciferol, vitamin D3, (VITAMIN D3) 25 mcg (1,000 unit) Chew Chew 1 tablet daily.      HUMALOG U-100 INSULIN 100 unit/mL injection TAKE UP TO 130 UNITS DAILY VIA INSULIN PUMP      insulin lispro (HUMALOG) 100 unit/mL injection pen Inject 8 Units under the skin Three (3) times a day before meals. Dx code: E11.9 Diabetes (Patient taking differently: Inject 8 Units under the skin continuous. Dx code: E11.9 Diabetes) 15 mL 5    insulin NPH (HUMULIN,NOVOLIN) 100 unit/mL injection Inject 20 units daily at 8:00 AM and inject 28 units daily at 10 PM.      insulin syringe-needle U-100 (BD VEO INSULIN SYRINGE UF) 1/2 mL 31 gauge x 15/64 (6 mm) Syrg CHECK BLOOD SUGAR 4 TIMES DAILY. 100 each 11    mycophenolate (MYFORTIC) 180 MG EC tablet Take 2 tablets (360 mg total) by mouth Two (2) times a day. 360 tablet 3    olmesartan (BENICAR) 20 MG tablet Take 1 tablet (20 mg total) by mouth daily. 90 tablet 3    omeprazole (PRILOSEC) 40 MG capsule Take 1 capsule (40 mg total) by mouth daily. 90 capsule 3    sodium bicarbonate 650 mg tablet Take 1 tablet (650 mg total) by mouth Two (2) times a day. (Patient not taking: Reported on 06/06/2022) 100 tablet 5    tacrolimus (PROGRAF) 1 MG capsule Take 2 capsules (2 mg total) by mouth two (2) times a day. 540 capsule 3     No current facility-administered medications for this visit.        Changes to medications: Patrick Vazquez Reports stopping the following medications: sodium bicarb, messaging clinic    No Known Allergies    Changes to allergies: No    SPECIALTY MEDICATION ADHERENCE     Tacrolimus 1mg   : 90 days of medicine on hand   Mycophenolate 180mg   : 10 days of medicine on hand       Medication Adherence    Patient reported X missed doses in the last month: 0  Specialty Medication: mycophenolate 180mg   Patient is on additional specialty medications: Yes  Additional Specialty Medications: Tacrolimus 1mg   Patient Reported Additional Medication X Missed Doses in the Last Month: 0  Specialty medication(s) dose(s) confirmed: Regimen is correct and unchanged.     Are there any concerns with adherence? No    Adherence counseling provided? Not needed    CLINICAL MANAGEMENT AND INTERVENTION      Clinical Benefit Assessment:    Do you feel the medicine is effective or helping your condition? Yes    Clinical Benefit counseling provided? Not needed    Adverse Effects Assessment:    Are you experiencing any side effects? No    Are you experiencing difficulty administering your medicine? No    Quality of Life Assessment:    Quality of Life    Rheumatology  Oncology  Dermatology  Cystic Fibrosis          How many days over the past month did your transplant  keep you from your normal activities? For example, brushing your teeth or getting up in the morning. 0    Have you discussed this with your provider? Not needed    Acute Infection Status:    Acute infections noted within Epic:  No active infections  Patient reported infection: None    Therapy Appropriateness:    Is therapy appropriate and patient progressing towards therapeutic goals? Yes, therapy is appropriate and should be continued    DISEASE/MEDICATION-SPECIFIC INFORMATION      N/A    Solid Organ Transplant: Not Applicable    PATIENT SPECIFIC NEEDS     Does the patient have any physical, cognitive, or cultural barriers? No    Is the patient high risk? Yes, patient is taking a REMS drug. Medication is dispensed in compliance with REMS program    Did the patient require a clinical intervention? No    Does the patient require physician intervention or other additional services (i.e., nutrition, smoking cessation, social work)? No    SOCIAL DETERMINANTS OF HEALTH     At the [facility], we have learned that life circumstances - like trouble affording food, housing, utilities, or transportation can affect the health of many of our patients.   That is why we wanted to ask: are you currently experiencing any life circumstances that are negatively impacting your health and/or quality of life? No    Social Determinants of Psychologist, prison and probation services Strain: Not on file   Internet Connectivity: Not on file   Food Insecurity: Not on file   Tobacco Use: Medium Risk (04/29/2022)    Received from [facility]    Patient History     Smoking Tobacco Use: Former     Smokeless Tobacco Use: Never     Passive Exposure: Not on file   Housing/Utilities: Not on file   Alcohol Use: Not on file   Transportation Needs: Not on file   Substance Use: Not on file   Health Literacy: Not on file   Physical Activity: Not on file   Interpersonal Safety: Not on file   Stress: Not on file   Intimate Partner Violence: Not on file   Depression: At risk (03/03/2022)    PHQ-2     PHQ-2 Score: 3   Social Connections: Not on file       Would you be willing to receive help with any of the needs that you have identified today? Not applicable       SHIPPING     Specialty Medication(s) to be Shipped:   Transplant:  mycophenolic acid 180mg     Other medication(s) to be shipped:  amlodipine  Changes to insurance: No    Delivery Scheduled: Yes, Expected medication delivery date: 06/11/2022.     Medication will be delivered via UPS to the confirmed prescription address in [facility].    The patient will receive a drug information handout for each medication shipped and additional FDA Medication Guides as required.  Verified that patient has previously received a Conservation officer, historic buildings and a Surveyor, mining.    The patient or caregiver noted above participated in the development of this care plan and knows that they can request review of or adjustments to the care plan at any time.      All of the patient's questions and concerns have been addressed.    Thad Ranger, PharmD   [facility] Specialty Pharmacist

## 2022-06-09 ENCOUNTER — Ambulatory Visit: Admit: 2022-06-09 | Discharge: 2022-06-10 | Payer: BLUE CROSS/BLUE SHIELD

## 2022-06-09 DIAGNOSIS — Z940 Kidney transplant status: Principal | ICD-10-CM

## 2022-06-09 LAB — CBC W/ AUTO DIFF
BASOPHILS ABSOLUTE COUNT: 0 10*9/L (ref 0.0–0.2)
BASOPHILS RELATIVE PERCENT: 0.5 %
EOSINOPHILS ABSOLUTE COUNT: 0.3 10*9/L (ref 0.0–0.4)
EOSINOPHILS RELATIVE PERCENT: 6.1 %
HEMATOCRIT: 42.7 % (ref 36.0–50.0)
HEMOGLOBIN: 14.3 g/dL (ref 12.5–17.0)
LYMPHOCYTES ABSOLUTE COUNT: 1.9 10*9/L (ref 0.7–4.5)
LYMPHOCYTES RELATIVE PERCENT: 42.6 %
MEAN CORPUSCULAR HEMOGLOBIN CONC: 33.5 g/dL (ref 32.0–36.0)
MEAN CORPUSCULAR HEMOGLOBIN: 27.6 pg (ref 27.0–34.0)
MEAN CORPUSCULAR VOLUME: 82.4 fL (ref 80.0–98.0)
MEAN PLATELET VOLUME: 10 fL (ref 7.4–10.4)
MONOCYTES ABSOLUTE COUNT: 0.4 10*9/L (ref 0.1–1.0)
MONOCYTES RELATIVE PERCENT: 8.8 %
NEUTROPHILS ABSOLUTE COUNT: 1.8 10*9/L (ref 1.8–7.8)
NEUTROPHILS RELATIVE PERCENT: 41.8 %
PLATELET COUNT: 225 10*9/L (ref 140–415)
RED BLOOD CELL COUNT: 5.18 10*12/L (ref 4.10–5.60)
RED CELL DISTRIBUTION WIDTH: 13.9 % (ref 11.5–14.5)
WBC ADJUSTED: 4.4 10*9/L (ref 4.0–10.5)

## 2022-06-09 LAB — MAGNESIUM: MAGNESIUM: 1.7 mg/dL (ref 1.6–2.6)

## 2022-06-09 LAB — COMPREHENSIVE METABOLIC PANEL
ALBUMIN: 3.4 g/dL — ABNORMAL LOW (ref 3.5–5.0)
ALKALINE PHOSPHATASE: 114 U/L (ref 46–116)
ALT (SGPT): 41 U/L (ref 12–78)
ANION GAP: 12 mmol/L — ABNORMAL HIGH (ref 3–11)
AST (SGOT): 41 U/L — ABNORMAL HIGH (ref 15–40)
BILIRUBIN TOTAL: 0.4 mg/dL (ref 0.3–1.2)
BLOOD UREA NITROGEN: 27 mg/dL — ABNORMAL HIGH (ref 8–20)
BUN / CREAT RATIO: 15
CALCIUM: 9.7 mg/dL (ref 8.5–10.1)
CHLORIDE: 106 mmol/L (ref 98–107)
CO2: 20.4 mmol/L — ABNORMAL LOW (ref 21.0–32.0)
CREATININE: 1.78 mg/dL — ABNORMAL HIGH (ref 0.80–1.30)
EGFR CKD-EPI (2021) MALE: 47 mL/min/{1.73_m2} — ABNORMAL LOW (ref >=60)
GLUCOSE RANDOM: 185 mg/dL — ABNORMAL HIGH (ref 70–179)
POTASSIUM: 4 mmol/L (ref 3.5–5.0)
PROTEIN TOTAL: 8.5 g/dL — ABNORMAL HIGH (ref 6.0–8.0)
SODIUM: 138 mmol/L (ref 135–145)

## 2022-06-09 LAB — PHOSPHORUS: PHOSPHORUS: 3.2 mg/dL (ref 2.4–5.1)

## 2022-06-10 LAB — TACROLIMUS LEVEL, TROUGH: TACROLIMUS, TROUGH: 5.8 ng/mL (ref 5.0–15.0)

## 2022-06-10 MED FILL — AMLODIPINE 10 MG TABLET: ORAL | 90 days supply | Qty: 90 | Fill #2

## 2022-06-10 MED FILL — MYCOPHENOLATE SODIUM 180 MG TABLET,DELAYED RELEASE: ORAL | 90 days supply | Qty: 360 | Fill #3

## 2022-06-11 MED ORDER — SODIUM BICARBONATE 650 MG TABLET
ORAL_TABLET | Freq: Two times a day (BID) | ORAL | 5 refills | 50 days | Status: CP
Start: 2022-06-11 — End: 2023-06-11
  Filled 2022-06-27: qty 100, 50d supply, fill #0

## 2022-06-11 NOTE — Unmapped (Signed)
Left detailed VM stating the patient needs to continue taking sodium bicarb 650 mg BID per Knox Saliva, CPP. Sent to [facility].

## 2022-07-08 DIAGNOSIS — Z940 Kidney transplant status: Principal | ICD-10-CM

## 2022-07-09 ENCOUNTER — Ambulatory Visit: Admit: 2022-07-09 | Discharge: 2022-07-10 | Payer: BLUE CROSS/BLUE SHIELD

## 2022-07-09 LAB — COMPREHENSIVE METABOLIC PANEL
ALBUMIN: 3.6 g/dL (ref 3.4–5.0)
ALKALINE PHOSPHATASE: 118 U/L — ABNORMAL HIGH (ref 46–116)
ALT (SGPT): 44 U/L (ref 10–49)
ANION GAP: 11 mmol/L (ref 5–14)
AST (SGOT): 35 U/L — ABNORMAL HIGH (ref <=34)
BILIRUBIN TOTAL: 0.5 mg/dL (ref 0.3–1.2)
BLOOD UREA NITROGEN: 24 mg/dL — ABNORMAL HIGH (ref 9–23)
BUN / CREAT RATIO: 15
CALCIUM: 9.3 mg/dL (ref 8.7–10.4)
CHLORIDE: 107 mmol/L (ref 98–107)
CO2: 17.9 mmol/L — ABNORMAL LOW (ref 20.0–31.0)
CREATININE: 1.58 mg/dL — ABNORMAL HIGH
EGFR CKD-EPI (2021) MALE: 54 mL/min/{1.73_m2} — ABNORMAL LOW (ref >=60)
GLUCOSE RANDOM: 266 mg/dL — ABNORMAL HIGH (ref 70–99)
POTASSIUM: 4 mmol/L (ref 3.4–4.8)
PROTEIN TOTAL: 8 g/dL (ref 5.7–8.2)
SODIUM: 136 mmol/L (ref 135–145)

## 2022-07-09 LAB — CBC W/ AUTO DIFF
BASOPHILS ABSOLUTE COUNT: 0 10*9/L (ref 0.0–0.1)
BASOPHILS RELATIVE PERCENT: 0.6 %
EOSINOPHILS ABSOLUTE COUNT: 0.2 10*9/L (ref 0.0–0.5)
EOSINOPHILS RELATIVE PERCENT: 3.8 %
HEMATOCRIT: 43.4 % (ref 39.0–48.0)
HEMOGLOBIN: 14 g/dL (ref 12.9–16.5)
LYMPHOCYTES ABSOLUTE COUNT: 2 10*9/L (ref 1.1–3.6)
LYMPHOCYTES RELATIVE PERCENT: 36.6 %
MEAN CORPUSCULAR HEMOGLOBIN CONC: 32.4 g/dL (ref 32.0–36.0)
MEAN CORPUSCULAR HEMOGLOBIN: 27.3 pg (ref 25.9–32.4)
MEAN CORPUSCULAR VOLUME: 84.5 fL (ref 77.6–95.7)
MEAN PLATELET VOLUME: 9.2 fL (ref 6.8–10.7)
MONOCYTES ABSOLUTE COUNT: 0.3 10*9/L (ref 0.3–0.8)
MONOCYTES RELATIVE PERCENT: 6.3 %
NEUTROPHILS ABSOLUTE COUNT: 2.9 10*9/L (ref 1.8–7.8)
NEUTROPHILS RELATIVE PERCENT: 52.7 %
PLATELET COUNT: 231 10*9/L (ref 150–450)
RED BLOOD CELL COUNT: 5.14 10*12/L (ref 4.26–5.60)
RED CELL DISTRIBUTION WIDTH: 14.7 % (ref 12.2–15.2)
WBC ADJUSTED: 5.5 10*9/L (ref 3.6–11.2)

## 2022-07-09 LAB — URINALYSIS WITH MICROSCOPY
BACTERIA: NONE SEEN /HPF
BILIRUBIN UA: NEGATIVE
BLOOD UA: NEGATIVE
GLUCOSE UA: 250 — AB
KETONES UA: NEGATIVE
LEUKOCYTE ESTERASE UA: NEGATIVE
NITRITE UA: NEGATIVE
PH UA: 6 (ref 5.0–9.0)
PROTEIN UA: NEGATIVE
RBC UA: 1 /HPF (ref <3)
SPECIFIC GRAVITY UA: 1.02 (ref 1.005–1.030)
SQUAMOUS EPITHELIAL: 2 /HPF (ref 0–5)
UROBILINOGEN UA: 0.2
WBC UA: 1 /HPF (ref <2)

## 2022-07-09 LAB — ALBUMIN / CREATININE URINE RATIO
ALBUMIN QUANT URINE: 2 mg/dL
ALBUMIN/CREATININE RATIO: 20.9 ug/mg (ref 0.0–30.0)
CREATININE, URINE: 95.9 mg/dL

## 2022-07-09 LAB — TACROLIMUS LEVEL, TROUGH: TACROLIMUS, TROUGH: 5.5 ng/mL (ref 5.0–15.0)

## 2022-07-09 LAB — PROTEIN / CREATININE RATIO, URINE
CREATININE, URINE: 95.9 mg/dL
PROTEIN URINE: 21.1 mg/dL
PROTEIN/CREAT RATIO, URINE: 0.22

## 2022-07-09 LAB — CMV DNA, QUANTITATIVE, PCR: CMV VIRAL LD: NOT DETECTED

## 2022-07-09 LAB — MAGNESIUM: MAGNESIUM: 1.5 mg/dL — ABNORMAL LOW (ref 1.6–2.6)

## 2022-07-09 LAB — PHOSPHORUS: PHOSPHORUS: 1.9 mg/dL — ABNORMAL LOW (ref 2.4–5.1)

## 2022-07-09 NOTE — Unmapped (Addendum)
Great to see you today!    Your goal tacrolimus trough level is 5-7.    STOP sodium bicarbonate.  Recommended the Shingrix vaccine (at  your pharmacy)  Please contact us if your BP is low causing fatigue or dizziness.     Labs once monthly  Return in 6 months (sooner if issues arise).

## 2022-07-09 NOTE — Unmapped (Signed)
Transplant Coordinator, Clinic Visit   Pt seen today by transplant nephrology for follow up, reviewed medications and symptoms.          07/09/22 0836   BP: 97/61   Pulse: 76   Temp: 36.5 ??C (97.7 ??F)   Weight: (!) 135 kg (297 lb 9.6 oz)   PainSc: 0-No pain       Assessment  BP @ Home: 125/82  BG: 341 AM. Usually 180s-200, will follow up with [facility] q 3mos  HA/Dizziness/Lightheaded: No  Hand tremors: No  Numbness/tingling: No  Fevers/Chills/sweats: No  Chest Pain/SOB: No  N/V/Heartburn: No  Diarrhea/constipation: No  UTI symptoms: No  Swelling: No  Pain: 0  Incision/Drain/Foley: N/A    Good appetite; reports adequate hydration    Any new medications? No  Immunosuppressant last taken: 9:30PM    Immunization status: UTD    Functional Score: 100   Normal no complaints; no evidence of  disease.

## 2022-07-09 NOTE — Unmapped (Signed)
[facility]   TRANSPLANT FOLLOW UP     PCP: Cher Nakai, DO   Kidney transplant coordinator: Artis Flock    Date of Visit at Transplant clinic: 07/09/2022     Assessment/Recommendations:     # s/p deceased donor kidney transplant 01/28/2016   Native kidney disease T1DM.  Graft function: Cr 1.4-1.8, without proteinuria.  DSAs: none detected Jan 2024  STOP sodium bicarb, watch labs  Has LUE AVF which thrombosed, no issues    # Immunosuppression  Tacrolimus (Prograf) 2 mg BID, goal trough 5-7 ng/mL  Mycophenolate (Myfortic) 360 mg BID    # Acute issues today  none    # BP management   Goal 130/80, as tolerated.  - amlodipine 10 mg daily  - carvedilol 25 mg daily  - olmesartan 20 mg daily    # Infectious disease  CMV D+/R-, EBV D+/R+    # Anemia screening  Hb normal    # Cardiovascular: primary CAD prevention  # History of stroke Sept 2015 due to uncontrolled HTN - LLE weakness, treated at [facility], with full recovery.  -The 10-year ASCVD risk score (Arnett DK, et al., 2019) is: 8.5%  - Atorvastatin 40 mg daily  - ASA 81 mg daily    # CKD-BMD  Ca and phos normal  PTH 99.6 07/31/21    # Electrolytes  Mg 1.5 off supp  K acceptable    # Comorbidities   Type 1 DM: HbA1c 7.2 as of Jan 2024   - NPH 20 units AM, 28 units PM  - Humalog 8 units + correction with meals  Endocrinologist: Dr. Tedd Sias [facility] clinic in [place]), sees q3 months    Weight management:  - had weight loss benefit with Trulicity, but stopped due to diarrhea with increased dose  - stopped Ozempic because of GI symptoms  - follows up with nutritionist Greta Doom) and weight management clinic for weight loss    # Immunizations  - Recommended the Shingrix vaccine  Immunization History   Administered Date(s) Administered    COVID-19 VACCINE,MRNA(MODERNA)(PF) 04/27/2019, 06/01/2019, 11/01/2019, 06/27/2020, 12/19/2021    Covid-19 Vac, (36yr+) (Spikevax) Monovalent Xbb.1.5 Moder  12/19/2021    Influenza Vaccine Quad(IM)6 MO-Adult(PF) 03/03/2017, 10/27/2017, 11/25/2018, 01/04/2020, 02/27/2021, 12/05/2021    Influenza Virus Vaccine, unspecified formulation 11/09/2015, 03/03/2017, 10/27/2017    Pneumococcal Conjugate 20-valent 03/07/2022       # Cancer screening  Colonoscopy: scheduled for 03/31/22  Skin: recommend yearly dermatology evaluation    # Follow up:  Labs monthly  Visits return in 6 months      Kidney Transplant History:   Date of Transplant: 01/28/2016 (Kidney)  Type of Transplant: DDKT, DCD  KDPI: 89%  Cold ischemic time: 1,046 minutes (17hr )  Warm ischemic time: 49 minutes  cPRA: 0%  HLA match:   Blood type: Donor O, Recipient O POS  ID: CMV D+/R-, EBV D+/R+  Native Kidney Disease: T1DM   Native kidney biopsy: no   Pre-transplant dialysis course: HD since 12/08/2012  Post-Transplant Course:    Delayed graft function requiring dialysis: Yes   Other complications: no  Prior Transplants: None  Induction: Campath  Early steroid withdrawal: Yes    Biopsies:   Zero-Hour Biopsy: Moderate to severe arteriosclerosis observed in some of the vascular cross sections; Mild acute tubular injury.    History of Presenting Illness:     Since the last visit:  - following with endocrinologist for blood sugars  - home BPs  120s    Concerns about nonadherence: No    Social: Lives in [place].    Review of Systems:   A 12-system review was negative except as documented in the HPI.    Physical Exam:     BP 97/61 (BP Site: R Arm, BP Position: Sitting, BP Cuff Size: Large)  - Pulse 76  - Temp 36.5 ??C (97.7 ??F) (Temporal)  - Wt (!) 135 kg (297 lb 9.6 oz)  - BMI 39.27 kg/m??   Constitutional:  Well-appearing in NAD  Eyes:  anicteric sclerae  ENT:  MMM  CV:  RRR, no m/r/g, no JVD, extremities WWP with no edema  Resp:  Good air movement, CTAB  GI:  Abdomen soft, NTND, +bs  MSK:  Grossly normal, exam is limited  Skin:  Normal turgor, no rash  Neuro:  Grossly normal, exam is limited  Psych:  Normal affect    Allergies: No Known Allergies Current Medications:   Current Outpatient Medications   Medication Sig Dispense Refill    acetaminophen (TYLENOL) 325 MG tablet Take 1-2 tablets (325-650 mg total) by mouth every four (4) hours as needed for pain. 100 tablet 0    amlodipine (NORVASC) 10 MG tablet Take 1 tablet (10 mg total) by mouth daily. 90 tablet 3    aspirin (ECOTRIN) 81 MG tablet Take 1 tablet (81 mg total) by mouth daily. 30 tablet 11    atorvastatin (LIPITOR) 40 MG tablet Take 1 tablet (40 mg total) by mouth every evening. 90 tablet 3    blood sugar diagnostic Strp Test Blood Sugar four times a day; E11.9 120 strip 5    carvedilol (COREG) 25 MG tablet Take 1 tablet (25 mg total) by mouth Two (2) times a day. 180 tablet 3    cholecalciferol, vitamin D3, (VITAMIN D3) 25 mcg (1,000 unit) Chew Chew 1 tablet daily.      HUMALOG U-100 INSULIN 100 unit/mL injection TAKE UP TO 130 UNITS DAILY VIA INSULIN PUMP (Patient not taking: Reported on 07/09/2022)      insulin lispro (HUMALOG) 100 unit/mL injection pen Inject 8 Units under the skin Three (3) times a day before meals. Dx code: E11.9 Diabetes (Patient taking differently: Inject 8 Units under the skin continuous. Dx code: E11.9 Diabetes) 15 mL 5    insulin NPH (HUMULIN,NOVOLIN) 100 unit/mL injection Inject 20 units daily at 8:00 AM and inject 28 units daily at 10 PM.      insulin syringe-needle U-100 (BD VEO INSULIN SYRINGE UF) 1/2 mL 31 gauge x 15/64 (6 mm) Syrg CHECK BLOOD SUGAR 4 TIMES DAILY. 100 each 11    mycophenolate (MYFORTIC) 180 MG EC tablet Take 2 tablets (360 mg total) by mouth Two (2) times a day. 360 tablet 3    olmesartan (BENICAR) 20 MG tablet Take 1 tablet (20 mg total) by mouth daily. 90 tablet 3    omeprazole (PRILOSEC) 40 MG capsule Take 1 capsule (40 mg total) by mouth daily. 90 capsule 3    sodium bicarbonate 650 mg tablet Take 1 tablet (650 mg total) by mouth Two (2) times a day. 100 tablet 5    tacrolimus (PROGRAF) 1 MG capsule Take 2 capsules (2 mg total) by mouth two (2) times a day. 540 capsule 3     No current facility-administered medications for this visit.       Past Medical History:   Past Medical History:   Diagnosis Date    CVA (cerebral vascular accident) (CMS-HCC) 10/2013  Diabetes mellitus (CMS-HCC)     Type 1    Diabetic ketoacidosis associated with type 1 diabetes mellitus (CMS-HCC) 02/28/2016    ESRD (end stage renal disease) (CMS-HCC)     HTN (hypertension)         Laboratory studies:   Reviewed recent results.

## 2022-07-13 MED FILL — TACROLIMUS 1 MG CAPSULE, IMMEDIATE-RELEASE: ORAL | 45 days supply | Qty: 180 | Fill #1

## 2022-08-08 MED FILL — OMEPRAZOLE 40 MG CAPSULE,DELAYED RELEASE: ORAL | 90 days supply | Qty: 90 | Fill #1

## 2022-08-08 MED FILL — ATORVASTATIN 40 MG TABLET: ORAL | 90 days supply | Qty: 90 | Fill #3

## 2022-08-08 MED FILL — OLMESARTAN 20 MG TABLET: ORAL | 90 days supply | Qty: 90 | Fill #3

## 2022-08-08 MED FILL — CARVEDILOL 25 MG TABLET: ORAL | 90 days supply | Qty: 180 | Fill #3

## 2022-08-20 ENCOUNTER — Ambulatory Visit: Admit: 2022-08-20 | Discharge: 2022-08-21 | Payer: BLUE CROSS/BLUE SHIELD

## 2022-08-20 DIAGNOSIS — Z940 Kidney transplant status: Principal | ICD-10-CM

## 2022-08-20 LAB — URINALYSIS WITH MICROSCOPY
BACTERIA: NONE SEEN /HPF
BILIRUBIN UA: NEGATIVE
BLOOD UA: NEGATIVE
GLUCOSE UA: NEGATIVE
KETONES UA: NEGATIVE
LEUKOCYTE ESTERASE UA: NEGATIVE
NITRITE UA: NEGATIVE
PH UA: 5.5 (ref 5.0–9.0)
PROTEIN UA: NEGATIVE
RBC UA: 1 /HPF (ref <3)
SPECIFIC GRAVITY UA: 1.015 (ref 1.005–1.030)
SQUAMOUS EPITHELIAL: <1 /HPF (ref 0–5)
UROBILINOGEN UA: 0.2
WBC UA: <1 /HPF (ref <2)

## 2022-08-20 LAB — COMPREHENSIVE METABOLIC PANEL
ALBUMIN: 3.8 g/dL (ref 3.4–5.0)
ALKALINE PHOSPHATASE: 93 U/L (ref 46–116)
ALT (SGPT): 29 U/L (ref 10–49)
ANION GAP: 8 mmol/L (ref 5–14)
AST (SGOT): 32 U/L (ref <=34)
BILIRUBIN TOTAL: 0.5 mg/dL (ref 0.3–1.2)
BLOOD UREA NITROGEN: 21 mg/dL (ref 9–23)
BUN / CREAT RATIO: 13
CALCIUM: 10.2 mg/dL (ref 8.7–10.4)
CHLORIDE: 111 mmol/L — ABNORMAL HIGH (ref 98–107)
CO2: 22 mmol/L (ref 20.0–31.0)
CREATININE: 1.59 mg/dL — ABNORMAL HIGH
EGFR CKD-EPI (2021) MALE: 54 mL/min/{1.73_m2} — ABNORMAL LOW (ref >=60)
GLUCOSE RANDOM: 71 mg/dL (ref 70–99)
POTASSIUM: 3.9 mmol/L (ref 3.4–4.8)
PROTEIN TOTAL: 8.1 g/dL (ref 5.7–8.2)
SODIUM: 141 mmol/L (ref 135–145)

## 2022-08-20 LAB — CBC W/ AUTO DIFF
BASOPHILS ABSOLUTE COUNT: 0 10*9/L (ref 0.0–0.1)
BASOPHILS RELATIVE PERCENT: 0.7 %
EOSINOPHILS ABSOLUTE COUNT: 0.1 10*9/L (ref 0.0–0.5)
EOSINOPHILS RELATIVE PERCENT: 2.2 %
HEMATOCRIT: 41.5 % (ref 39.0–48.0)
HEMOGLOBIN: 14 g/dL (ref 12.9–16.5)
LYMPHOCYTES ABSOLUTE COUNT: 1.8 10*9/L (ref 1.1–3.6)
LYMPHOCYTES RELATIVE PERCENT: 43.6 %
MEAN CORPUSCULAR HEMOGLOBIN CONC: 33.8 g/dL (ref 32.0–36.0)
MEAN CORPUSCULAR HEMOGLOBIN: 28.3 pg (ref 25.9–32.4)
MEAN CORPUSCULAR VOLUME: 83.8 fL (ref 77.6–95.7)
MEAN PLATELET VOLUME: 9 fL (ref 6.8–10.7)
MONOCYTES ABSOLUTE COUNT: 0.4 10*9/L (ref 0.3–0.8)
MONOCYTES RELATIVE PERCENT: 8.9 %
NEUTROPHILS ABSOLUTE COUNT: 1.9 10*9/L (ref 1.8–7.8)
NEUTROPHILS RELATIVE PERCENT: 44.6 %
PLATELET COUNT: 230 10*9/L (ref 150–450)
RED BLOOD CELL COUNT: 4.96 10*12/L (ref 4.26–5.60)
RED CELL DISTRIBUTION WIDTH: 15.1 % (ref 12.2–15.2)
WBC ADJUSTED: 4.2 10*9/L (ref 3.6–11.2)

## 2022-08-20 LAB — PROTEIN / CREATININE RATIO, URINE
CREATININE, URINE: 71.2 mg/dL
PROTEIN URINE: 10.5 mg/dL
PROTEIN/CREAT RATIO, URINE: 0.147

## 2022-08-20 LAB — ALBUMIN / CREATININE URINE RATIO
ALBUMIN QUANT URINE: 1.7 mg/dL
ALBUMIN/CREATININE RATIO: 23.9 ug/mg (ref 0.0–30.0)
CREATININE, URINE: 71.2 mg/dL

## 2022-08-20 LAB — PHOSPHORUS: PHOSPHORUS: 2.3 mg/dL — ABNORMAL LOW (ref 2.4–5.1)

## 2022-08-20 LAB — MAGNESIUM: MAGNESIUM: 1.7 mg/dL (ref 1.6–2.6)

## 2022-08-21 LAB — CMV DNA, QUANTITATIVE, PCR: CMV VIRAL LD: NOT DETECTED

## 2022-08-21 LAB — TACROLIMUS LEVEL, TROUGH: TACROLIMUS, TROUGH: 4.8 ng/mL — ABNORMAL LOW (ref 5.0–15.0)

## 2022-09-04 DIAGNOSIS — Z940 Kidney transplant status: Principal | ICD-10-CM

## 2022-09-04 MED ORDER — MYCOPHENOLATE SODIUM 180 MG TABLET,DELAYED RELEASE
ORAL_TABLET | Freq: Two times a day (BID) | ORAL | 3 refills | 90 days | Status: CP
Start: 2022-09-04 — End: 2023-09-04
  Filled 2022-09-09: qty 360, 90d supply, fill #0

## 2022-09-04 NOTE — Unmapped (Signed)
[facility] Refill Coordination Note    Specialty Medication(s) to be Shipped:   Transplant: mycophenolate mofetil 180 mg and tacrolimus 1mg     Other medication(s) to be shipped:  amlodipine      Patrick Vazquez, DOB: 1975/12/30  Phone: [phone] (home) [phone] (work)      All above HIPAA information was verified with patient.     Was a Nurse, learning disability used for this call? No    Completed refill call assessment today to schedule patient's medication shipment from the [facility] [phone]).  All relevant notes have been reviewed.     Specialty medication(s) and dose(s) confirmed: Regimen is correct and unchanged.   Changes to medications: Donn reports no changes at this time.  Changes to insurance: No  New side effects reported not previously addressed with a pharmacist or physician: None reported  Questions for the pharmacist: No    Confirmed patient received a Conservation officer, historic buildings and a Surveyor, mining with first shipment. The patient will receive a drug information handout for each medication shipped and additional FDA Medication Guides as required.       DISEASE/MEDICATION-SPECIFIC INFORMATION        N/A    SPECIALTY MEDICATION ADHERENCE     Medication Adherence    Patient reported X missed doses in the last month: 0  Specialty Medication: tacrolimus 1 MG capsule (PROGRAF)  Patient is on additional specialty medications: Yes  Additional Specialty Medications: mycophenolate 180 MG EC tablet (MYFORTIC)  Patient Reported Additional Medication X Missed Doses in the Last Month: 0              Were doses missed due to medication being on hold? No      tacrolimus 1 MG capsule (PROGRAF): 16 days of medicine on hand     mycophenolate 180 MG EC tablet (MYFORTIC): 16 days of medicine on hand        REFERRAL TO PHARMACIST     Referral to the pharmacist: Not needed      [facility]     Shipping address confirmed in Epic.       Delivery Scheduled: Yes, Expected medication delivery date: 09/10/2022.     Medication will be delivered via UPS to the prescription address in Epic [place].    Tel Hevia J Helane Gunther   [facility] Specialty Technician

## 2022-09-05 NOTE — Unmapped (Signed)
[facility] Pharm[facility] Clinical Intervention    Type of intervention: Narrow therapeutic index drug    Medication involved: tacrolimus    Problem identified: mfg change per [place] alignment    Intervention performed: pt and clinic ok with switch. Pt knows to alert clinic on switch date as bloodwork may be needed    Follow-up needed: clinic aware    Approximate time spent: 5-10 minutes    Clinical evidence used to support intervention: FDA Orange Book    Result of the intervention: Prevention of an adverse drug event    Thad Ranger, PharmD   [facility] Specialty Pharmacist

## 2022-09-09 MED FILL — AMLODIPINE 10 MG TABLET: ORAL | 90 days supply | Qty: 90 | Fill #3

## 2022-09-09 MED FILL — TACROLIMUS 1 MG CAPSULE, IMMEDIATE-RELEASE: ORAL | 45 days supply | Qty: 180 | Fill #2

## 2022-09-18 ENCOUNTER — Ambulatory Visit: Admit: 2022-09-18 | Discharge: 2022-09-19 | Payer: BLUE CROSS/BLUE SHIELD

## 2022-09-18 DIAGNOSIS — Z940 Kidney transplant status: Principal | ICD-10-CM

## 2022-09-18 LAB — COMPREHENSIVE METABOLIC PANEL
ALBUMIN: 3.8 g/dL (ref 3.4–5.0)
ALKALINE PHOSPHATASE: 100 U/L (ref 46–116)
ALT (SGPT): 30 U/L (ref 10–49)
ANION GAP: 9 mmol/L (ref 5–14)
AST (SGOT): 36 U/L — ABNORMAL HIGH (ref <=34)
BILIRUBIN TOTAL: 0.5 mg/dL (ref 0.3–1.2)
BLOOD UREA NITROGEN: 22 mg/dL (ref 9–23)
BUN / CREAT RATIO: 13
CALCIUM: 10.1 mg/dL (ref 8.7–10.4)
CHLORIDE: 110 mmol/L — ABNORMAL HIGH (ref 98–107)
CO2: 20.5 mmol/L (ref 20.0–31.0)
CREATININE: 1.68 mg/dL — ABNORMAL HIGH
EGFR CKD-EPI (2021) MALE: 50 mL/min/{1.73_m2} — ABNORMAL LOW (ref >=60)
GLUCOSE RANDOM: 41 mg/dL — CL (ref 70–99)
POTASSIUM: 4 mmol/L (ref 3.4–4.8)
PROTEIN TOTAL: 8.1 g/dL (ref 5.7–8.2)
SODIUM: 139 mmol/L (ref 135–145)

## 2022-09-18 LAB — PROTEIN / CREATININE RATIO, URINE
CREATININE, URINE: 93.3 mg/dL
PROTEIN URINE: 27.3 mg/dL
PROTEIN/CREAT RATIO, URINE: 0.293

## 2022-09-18 LAB — URINALYSIS WITH MICROSCOPY
BILIRUBIN UA: NEGATIVE
GLUCOSE UA: NEGATIVE
KETONES UA: NEGATIVE
LEUKOCYTE ESTERASE UA: NEGATIVE
NITRITE UA: NEGATIVE
PH UA: 6 (ref 5.0–9.0)
PROTEIN UA: NEGATIVE
RBC UA: <1 /HPF (ref <3)
SPECIFIC GRAVITY UA: 1.01 (ref 1.005–1.030)
SQUAMOUS EPITHELIAL: 1 /HPF (ref 0–5)
UROBILINOGEN UA: 0.2
WBC UA: 3 /HPF — ABNORMAL HIGH (ref <2)

## 2022-09-18 LAB — CBC W/ AUTO DIFF
BASOPHILS ABSOLUTE COUNT: 0 10*9/L (ref 0.0–0.1)
BASOPHILS RELATIVE PERCENT: 0.8 %
EOSINOPHILS ABSOLUTE COUNT: 0.1 10*9/L (ref 0.0–0.5)
EOSINOPHILS RELATIVE PERCENT: 2 %
HEMATOCRIT: 42.8 % (ref 39.0–48.0)
HEMOGLOBIN: 14 g/dL (ref 12.9–16.5)
LYMPHOCYTES ABSOLUTE COUNT: 2 10*9/L (ref 1.1–3.6)
LYMPHOCYTES RELATIVE PERCENT: 32.3 %
MEAN CORPUSCULAR HEMOGLOBIN CONC: 32.8 g/dL (ref 32.0–36.0)
MEAN CORPUSCULAR HEMOGLOBIN: 28.3 pg (ref 25.9–32.4)
MEAN CORPUSCULAR VOLUME: 86.2 fL (ref 77.6–95.7)
MEAN PLATELET VOLUME: 8.6 fL (ref 6.8–10.7)
MONOCYTES ABSOLUTE COUNT: 0.7 10*9/L (ref 0.3–0.8)
MONOCYTES RELATIVE PERCENT: 10.8 %
NEUTROPHILS ABSOLUTE COUNT: 3.3 10*9/L (ref 1.8–7.8)
NEUTROPHILS RELATIVE PERCENT: 54.1 %
PLATELET COUNT: 270 10*9/L (ref 150–450)
RED BLOOD CELL COUNT: 4.96 10*12/L (ref 4.26–5.60)
RED CELL DISTRIBUTION WIDTH: 15 % (ref 12.2–15.2)
WBC ADJUSTED: 6.1 10*9/L (ref 3.6–11.2)

## 2022-09-18 LAB — ALBUMIN / CREATININE URINE RATIO
ALBUMIN QUANT URINE: 6.5 mg/dL
ALBUMIN/CREATININE RATIO: 69.7 ug/mg — ABNORMAL HIGH (ref 0.0–30.0)
CREATININE, URINE: 93.3 mg/dL

## 2022-09-18 LAB — MAGNESIUM: MAGNESIUM: 1.7 mg/dL (ref 1.6–2.6)

## 2022-09-18 LAB — PHOSPHORUS: PHOSPHORUS: 2.7 mg/dL (ref 2.4–5.1)

## 2022-09-18 NOTE — Unmapped (Signed)
Glucose resulted 41 - Called to check on patient, he was at the grocery store and was alerted by his phone that BG was low. He stopped to eat at [employer] and feeling ok now. Asked patient to monitor closely and call back if needed. No other needs at this time.

## 2022-09-19 LAB — CMV DNA, QUANTITATIVE, PCR
CMV QUANT: <35 [IU]/mL — ABNORMAL HIGH (ref <0)
CMV VIRAL LD: DETECTED — AB

## 2022-09-19 LAB — TACROLIMUS LEVEL, TROUGH: TACROLIMUS, TROUGH: 4.1 ng/mL — ABNORMAL LOW (ref 5.0–15.0)

## 2022-10-08 ENCOUNTER — Ambulatory Visit: Admit: 2022-10-08 | Discharge: 2022-10-09 | Payer: BLUE CROSS/BLUE SHIELD

## 2022-10-08 DIAGNOSIS — Z940 Kidney transplant status: Principal | ICD-10-CM

## 2022-10-08 LAB — COMPREHENSIVE METABOLIC PANEL
ALBUMIN: 3.4 g/dL (ref 3.4–5.0)
ALKALINE PHOSPHATASE: 96 U/L (ref 46–116)
ALT (SGPT): 20 U/L (ref 10–49)
ANION GAP: 9 mmol/L (ref 5–14)
AST (SGOT): 23 U/L (ref <=34)
BILIRUBIN TOTAL: 0.4 mg/dL (ref 0.3–1.2)
BLOOD UREA NITROGEN: 17 mg/dL (ref 9–23)
BUN / CREAT RATIO: 10
CALCIUM: 9.7 mg/dL (ref 8.7–10.4)
CHLORIDE: 112 mmol/L — ABNORMAL HIGH (ref 98–107)
CO2: 19.3 mmol/L — ABNORMAL LOW (ref 20.0–31.0)
CREATININE: 1.7 mg/dL — ABNORMAL HIGH
EGFR CKD-EPI (2021) MALE: 49 mL/min/{1.73_m2} — ABNORMAL LOW (ref >=60)
GLUCOSE RANDOM: 134 mg/dL (ref 70–179)
POTASSIUM: 4.3 mmol/L (ref 3.4–4.8)
PROTEIN TOTAL: 7.4 g/dL (ref 5.7–8.2)
SODIUM: 140 mmol/L (ref 135–145)

## 2022-10-08 LAB — CBC W/ AUTO DIFF
BASOPHILS ABSOLUTE COUNT: 0 10*9/L (ref 0.0–0.1)
BASOPHILS RELATIVE PERCENT: 0.9 %
EOSINOPHILS ABSOLUTE COUNT: 0.1 10*9/L (ref 0.0–0.5)
EOSINOPHILS RELATIVE PERCENT: 3 %
HEMATOCRIT: 39.7 % (ref 39.0–48.0)
HEMOGLOBIN: 12.9 g/dL (ref 12.9–16.5)
LYMPHOCYTES ABSOLUTE COUNT: 1.7 10*9/L (ref 1.1–3.6)
LYMPHOCYTES RELATIVE PERCENT: 41 %
MEAN CORPUSCULAR HEMOGLOBIN CONC: 32.4 g/dL (ref 32.0–36.0)
MEAN CORPUSCULAR HEMOGLOBIN: 27.7 pg (ref 25.9–32.4)
MEAN CORPUSCULAR VOLUME: 85.5 fL (ref 77.6–95.7)
MEAN PLATELET VOLUME: 9.1 fL (ref 6.8–10.7)
MONOCYTES ABSOLUTE COUNT: 0.5 10*9/L (ref 0.3–0.8)
MONOCYTES RELATIVE PERCENT: 12.8 %
NEUTROPHILS ABSOLUTE COUNT: 1.8 10*9/L (ref 1.8–7.8)
NEUTROPHILS RELATIVE PERCENT: 42.3 %
PLATELET COUNT: 231 10*9/L (ref 150–450)
RED BLOOD CELL COUNT: 4.64 10*12/L (ref 4.26–5.60)
RED CELL DISTRIBUTION WIDTH: 14.8 % (ref 12.2–15.2)
WBC ADJUSTED: 4.2 10*9/L (ref 3.6–11.2)

## 2022-10-08 LAB — MAGNESIUM: MAGNESIUM: 1.5 mg/dL — ABNORMAL LOW (ref 1.6–2.6)

## 2022-10-08 LAB — PHOSPHORUS: PHOSPHORUS: 2.6 mg/dL (ref 2.4–5.1)

## 2022-10-08 LAB — CMV DNA, QUANTITATIVE, PCR: CMV VIRAL LD: NOT DETECTED

## 2022-10-09 LAB — TACROLIMUS LEVEL, TROUGH: TACROLIMUS, TROUGH: 4.2 ng/mL — ABNORMAL LOW (ref 5.0–15.0)

## 2022-10-29 MED ORDER — CARVEDILOL 25 MG TABLET
ORAL_TABLET | Freq: Two times a day (BID) | ORAL | 3 refills | 90 days
Start: 2022-10-29 — End: 2023-10-29

## 2022-10-29 MED ORDER — ATORVASTATIN 40 MG TABLET
ORAL_TABLET | Freq: Every evening | ORAL | 3 refills | 90 days
Start: 2022-10-29 — End: 2023-10-29

## 2022-10-29 MED ORDER — OLMESARTAN 20 MG TABLET
ORAL_TABLET | Freq: Every day | ORAL | 3 refills | 90 days
Start: 2022-10-29 — End: 2023-10-29

## 2022-10-29 NOTE — Unmapped (Signed)
[facility] Pharmacy Refill Coordination Note    Specialty Medication(s) to be Shipped:   Transplant: tacrolimus 1mg     Other medication(s) to be shipped:  omeprazole,olmesartan,carvedilol , atorvastatin      Patrick Vazquez, DOB: 1975/10/06  Phone: [phone] (home) [phone] (work)      All above HIPAA information was verified with patient.     Was a Nurse, learning disability used for this call? No    Completed refill call assessment today to schedule patient's medication shipment from the [facility] [phone]).  All relevant notes have been reviewed.     Specialty medication(s) and dose(s) confirmed: Regimen is correct and unchanged.   Changes to medications: Dawid reports no changes at this time.  Changes to insurance: No  New side effects reported not previously addressed with a pharmacist or physician: None reported  Questions for the pharmacist: No    Confirmed patient received a Conservation officer, historic buildings and a Surveyor, mining with first shipment. The patient will receive a drug information handout for each medication shipped and additional FDA Medication Guides as required.       DISEASE/MEDICATION-SPECIFIC INFORMATION        N/A    SPECIALTY MEDICATION ADHERENCE     Medication Adherence    Patient reported X missed doses in the last month: 0  Specialty Medication: tacrolimus 1 MG capsule (PROGRAF)  Patient is on additional specialty medications: No              Were doses missed due to medication being on hold? No      tacrolimus 1 MG capsule (PROGRAF): 7 days of medicine on hand       REFERRAL TO PHARMACIST     Referral to the pharmacist: Not needed      [facility]     Shipping address confirmed in Epic.       Delivery Scheduled: Yes, Expected medication delivery date: 11/05/2022.     Medication will be delivered via UPS to the prescription address in Epic [place].    Lamario Mani J Helane Gunther   [facility] Specialty Technician

## 2022-10-30 MED ORDER — ATORVASTATIN 40 MG TABLET
ORAL | 3 refills | 90 days | Status: CP
Start: 2022-10-30 — End: 2023-10-30
  Filled 2022-11-04: qty 90, 90d supply, fill #0

## 2022-10-30 MED ORDER — OLMESARTAN 20 MG TABLET
ORAL_TABLET | Freq: Every day | ORAL | 3 refills | 90 days | Status: CP
Start: 2022-10-30 — End: 2023-10-30
  Filled 2022-11-04: qty 90, 90d supply, fill #0

## 2022-10-30 MED ORDER — CARVEDILOL 25 MG TABLET
ORAL_TABLET | Freq: Two times a day (BID) | ORAL | 3 refills | 90 days | Status: CP
Start: 2022-10-30 — End: 2023-10-30
  Filled 2022-11-04: qty 180, 90d supply, fill #0

## 2022-11-03 ENCOUNTER — Ambulatory Visit: Admit: 2022-11-03 | Discharge: 2022-11-04 | Payer: BLUE CROSS/BLUE SHIELD

## 2022-11-03 DIAGNOSIS — Z940 Kidney transplant status: Principal | ICD-10-CM

## 2022-11-03 LAB — PROTEIN / CREATININE RATIO, URINE
CREATININE, URINE: 155.2 mg/dL
PROTEIN URINE: 19.5 mg/dL
PROTEIN/CREAT RATIO, URINE: 0.126

## 2022-11-03 LAB — CBC W/ AUTO DIFF
BASOPHILS ABSOLUTE COUNT: 0 10*9/L (ref 0.0–0.1)
BASOPHILS RELATIVE PERCENT: 0.6 %
EOSINOPHILS ABSOLUTE COUNT: 0.1 10*9/L (ref 0.0–0.5)
EOSINOPHILS RELATIVE PERCENT: 1.1 %
HEMATOCRIT: 41.6 % (ref 39.0–48.0)
HEMOGLOBIN: 13.5 g/dL (ref 12.9–16.5)
LYMPHOCYTES ABSOLUTE COUNT: 2 10*9/L (ref 1.1–3.6)
LYMPHOCYTES RELATIVE PERCENT: 32.8 %
MEAN CORPUSCULAR HEMOGLOBIN CONC: 32.5 g/dL (ref 32.0–36.0)
MEAN CORPUSCULAR HEMOGLOBIN: 27.6 pg (ref 25.9–32.4)
MEAN CORPUSCULAR VOLUME: 85 fL (ref 77.6–95.7)
MEAN PLATELET VOLUME: 9.3 fL (ref 6.8–10.7)
MONOCYTES ABSOLUTE COUNT: 0.4 10*9/L (ref 0.3–0.8)
MONOCYTES RELATIVE PERCENT: 7.3 %
NEUTROPHILS ABSOLUTE COUNT: 3.5 10*9/L (ref 1.8–7.8)
NEUTROPHILS RELATIVE PERCENT: 58.2 %
PLATELET COUNT: 231 10*9/L (ref 150–450)
RED BLOOD CELL COUNT: 4.89 10*12/L (ref 4.26–5.60)
RED CELL DISTRIBUTION WIDTH: 14.1 % (ref 12.2–15.2)
WBC ADJUSTED: 6.1 10*9/L (ref 3.6–11.2)

## 2022-11-03 LAB — COMPREHENSIVE METABOLIC PANEL
ALBUMIN: 3.6 g/dL (ref 3.4–5.0)
ALKALINE PHOSPHATASE: 96 U/L (ref 46–116)
ALT (SGPT): 16 U/L (ref 10–49)
ANION GAP: 8 mmol/L (ref 5–14)
AST (SGOT): 18 U/L (ref <=34)
BILIRUBIN TOTAL: 0.4 mg/dL (ref 0.3–1.2)
BLOOD UREA NITROGEN: 26 mg/dL — ABNORMAL HIGH (ref 9–23)
BUN / CREAT RATIO: 14
CALCIUM: 10.1 mg/dL (ref 8.7–10.4)
CHLORIDE: 109 mmol/L — ABNORMAL HIGH (ref 98–107)
CO2: 19.9 mmol/L — ABNORMAL LOW (ref 20.0–31.0)
CREATININE: 1.87 mg/dL — ABNORMAL HIGH
EGFR CKD-EPI (2021) MALE: 44 mL/min/{1.73_m2} — ABNORMAL LOW (ref >=60)
GLUCOSE RANDOM: 104 mg/dL (ref 70–179)
POTASSIUM: 4.3 mmol/L (ref 3.4–4.8)
PROTEIN TOTAL: 7.7 g/dL (ref 5.7–8.2)
SODIUM: 137 mmol/L (ref 135–145)

## 2022-11-03 LAB — URINALYSIS WITH MICROSCOPY
BACTERIA: NONE SEEN /HPF
BILIRUBIN UA: NEGATIVE
BLOOD UA: NEGATIVE
GLUCOSE UA: 300 — AB
KETONES UA: NEGATIVE
LEUKOCYTE ESTERASE UA: NEGATIVE
NITRITE UA: NEGATIVE
PH UA: 5.5 (ref 5.0–9.0)
PROTEIN UA: NEGATIVE
RBC UA: 2 /HPF (ref <=3)
SPECIFIC GRAVITY UA: 1.015 (ref 1.003–1.030)
SQUAMOUS EPITHELIAL: <1 /HPF (ref 0–5)
UROBILINOGEN UA: <2
WBC UA: 2 /HPF (ref <=2)

## 2022-11-03 LAB — MAGNESIUM: MAGNESIUM: 1.4 mg/dL — ABNORMAL LOW (ref 1.6–2.6)

## 2022-11-03 LAB — PHOSPHORUS: PHOSPHORUS: 1.5 mg/dL — ABNORMAL LOW (ref 2.4–5.1)

## 2022-11-03 LAB — CMV DNA, QUANTITATIVE, PCR: CMV VIRAL LD: NOT DETECTED

## 2022-11-03 LAB — ALBUMIN / CREATININE URINE RATIO
ALBUMIN QUANT URINE: 1.2 mg/dL
ALBUMIN/CREATININE RATIO: 7.7 ug/mg (ref 0.0–30.0)
CREATININE, URINE: 155.2 mg/dL

## 2022-11-04 LAB — TACROLIMUS LEVEL, TROUGH: TACROLIMUS, TROUGH: 4.9 ng/mL — ABNORMAL LOW (ref 5.0–15.0)

## 2022-11-04 MED FILL — OMEPRAZOLE 40 MG CAPSULE,DELAYED RELEASE: ORAL | 90 days supply | Qty: 90 | Fill #2

## 2022-11-04 MED FILL — TACROLIMUS 1 MG CAPSULE, IMMEDIATE-RELEASE: ORAL | 45 days supply | Qty: 180 | Fill #3

## 2022-12-02 MED ORDER — AMLODIPINE 10 MG TABLET
ORAL_TABLET | Freq: Every day | ORAL | 3 refills | 90 days
Start: 2022-12-02 — End: 2023-12-02

## 2022-12-04 ENCOUNTER — Ambulatory Visit: Admit: 2022-12-04 | Discharge: 2022-12-05 | Payer: BLUE CROSS/BLUE SHIELD

## 2022-12-04 DIAGNOSIS — Z940 Kidney transplant status: Principal | ICD-10-CM

## 2022-12-04 LAB — CBC W/ AUTO DIFF
BASOPHILS ABSOLUTE COUNT: 0 10*9/L (ref 0.0–0.1)
BASOPHILS RELATIVE PERCENT: 0.6 %
EOSINOPHILS ABSOLUTE COUNT: 0.1 10*9/L (ref 0.0–0.5)
EOSINOPHILS RELATIVE PERCENT: 1.7 %
HEMATOCRIT: 43.2 % (ref 39.0–48.0)
HEMOGLOBIN: 14.2 g/dL (ref 12.9–16.5)
LYMPHOCYTES ABSOLUTE COUNT: 1.4 10*9/L (ref 1.1–3.6)
LYMPHOCYTES RELATIVE PERCENT: 41.4 %
MEAN CORPUSCULAR HEMOGLOBIN CONC: 32.8 g/dL (ref 32.0–36.0)
MEAN CORPUSCULAR HEMOGLOBIN: 27.6 pg (ref 25.9–32.4)
MEAN CORPUSCULAR VOLUME: 84.3 fL (ref 77.6–95.7)
MEAN PLATELET VOLUME: 8.6 fL (ref 6.8–10.7)
MONOCYTES ABSOLUTE COUNT: 0.4 10*9/L (ref 0.3–0.8)
MONOCYTES RELATIVE PERCENT: 11.7 %
NEUTROPHILS ABSOLUTE COUNT: 1.5 10*9/L — ABNORMAL LOW (ref 1.8–7.8)
NEUTROPHILS RELATIVE PERCENT: 44.6 %
PLATELET COUNT: 228 10*9/L (ref 150–450)
RED BLOOD CELL COUNT: 5.12 10*12/L (ref 4.26–5.60)
RED CELL DISTRIBUTION WIDTH: 14.5 % (ref 12.2–15.2)
WBC ADJUSTED: 3.3 10*9/L — ABNORMAL LOW (ref 3.6–11.2)

## 2022-12-04 LAB — COMPREHENSIVE METABOLIC PANEL
ALBUMIN: 3.5 g/dL (ref 3.4–5.0)
ALKALINE PHOSPHATASE: 90 U/L (ref 46–116)
ALT (SGPT): 19 U/L (ref 10–49)
ANION GAP: 10 mmol/L (ref 5–14)
AST (SGOT): 24 U/L (ref <=34)
BILIRUBIN TOTAL: 0.5 mg/dL (ref 0.3–1.2)
BLOOD UREA NITROGEN: 17 mg/dL (ref 9–23)
BUN / CREAT RATIO: 13
CALCIUM: 9.5 mg/dL (ref 8.7–10.4)
CHLORIDE: 108 mmol/L — ABNORMAL HIGH (ref 98–107)
CO2: 21.9 mmol/L (ref 20.0–31.0)
CREATININE: 1.34 mg/dL — ABNORMAL HIGH
EGFR CKD-EPI (2021) MALE: 65 mL/min/{1.73_m2} (ref >=60)
GLUCOSE RANDOM: 199 mg/dL — ABNORMAL HIGH (ref 70–99)
POTASSIUM: 4.3 mmol/L (ref 3.4–4.8)
PROTEIN TOTAL: 7.9 g/dL (ref 5.7–8.2)
SODIUM: 140 mmol/L (ref 135–145)

## 2022-12-04 LAB — MAGNESIUM: MAGNESIUM: 1.5 mg/dL — ABNORMAL LOW (ref 1.6–2.6)

## 2022-12-04 LAB — CMV DNA, QUANTITATIVE, PCR
CMV QUANT: <35 [IU]/mL — ABNORMAL HIGH (ref <0)
CMV VIRAL LD: DETECTED — AB

## 2022-12-04 LAB — PHOSPHORUS: PHOSPHORUS: 3.5 mg/dL (ref 2.4–5.1)

## 2022-12-04 LAB — TACROLIMUS LEVEL, TROUGH: TACROLIMUS, TROUGH: 4.2 ng/mL — ABNORMAL LOW (ref 5.0–15.0)

## 2022-12-05 NOTE — Unmapped (Signed)
[facility] Delivery Pharm[facility] Refill Coordination Note    Specialty Medication(s) to be Shipped:   Transplant: mycophenolate mofetil 180mg     Other medication(s) to be shipped:  amlodipine     Patrick Vazquez, DOB: 04/02/1975  Phone: [phone] (home) [phone] (work)      All above HIPAA information was verified with patient.     Was a Nurse, learning disability used for this call? No    Completed refill call assessment today to schedule patient's medication shipment from the [facility]  [phone]).  All relevant notes have been reviewed.     Specialty medication(s) and dose(s) confirmed: Regimen is correct and unchanged.   Changes to medications: [name] reports no changes at this time.  Changes to insurance: No  New side effects reported not previously addressed with a pharmacist or physician: None reported  Questions for the pharmacist: No    Confirmed patient received a Conservation officer, historic buildings and a Surveyor, mining with first shipment. The patient will receive a drug information handout for each medication shipped and additional FDA Medication Guides as required.       DISEASE/MEDICATION-SPECIFIC INFORMATION        N/A    SPECIALTY MEDICATION ADHERENCE     Medication Adherence    Patient reported X missed doses in the last month: 0  Specialty Medication: mycophenolate 180 MG EC tablet (MYFORTIC)  Patient is on additional specialty medications: No              Were doses missed due to medication being on hold? No    mycophenolate 180 mg: 10 days of medicine on hand       REFERRAL TO PHARMACIST     Referral to the pharmacist: Not needed      [facility]     Shipping address confirmed in Epic.       Delivery Scheduled: Yes, Expected medication delivery date: 12/12/22.     Medication will be delivered via UPS to the prescription address in Epic [place].    [name]   [facility] Pharm[facility]  Specialty Technician

## 2022-12-08 DIAGNOSIS — Z940 Kidney transplant status: Principal | ICD-10-CM

## 2022-12-08 MED ORDER — TACROLIMUS 1 MG CAPSULE, IMMEDIATE-RELEASE
ORAL_CAPSULE | ORAL | 3 refills | 90 days | Status: CP
Start: 2022-12-08 — End: 2023-12-08
  Filled 2022-12-17: qty 450, 90d supply, fill #0

## 2022-12-08 NOTE — Unmapped (Signed)
Reviewed tacrolimus trough 4.2 with Dr. Elvera Maria - Recommended to increase dose to 3 mg/2 mg daily. Patient verbalized understanding, will repeat labs in next 2 weeks. No other needs at this time.

## 2022-12-10 NOTE — Unmapped (Addendum)
[facility] Pharmacist has reviewed a new prescription for tacrolimus that indicates a dose increase.  Patient was counseled on this dosage change by coordinator LP- see epic note from 10/14.  Next refill call date adjusted if necessary.        Clinical Assessment Needed For: Dose Change  Medication: tacrolimus 1 MG capsule (PROGRAF)  Last Fill Date/Day Supply: 11/04/2022 / 45  Copay $4  Was previous dose already scheduled to fill: No    Notes to Pharmacist:

## 2022-12-11 MED ORDER — AMLODIPINE 10 MG TABLET
ORAL_TABLET | ORAL | 3 refills | 90 days | Status: CP
Start: 2022-12-11 — End: 2023-12-11
  Filled 2022-12-11: qty 90, 90d supply, fill #0

## 2022-12-11 MED FILL — MYCOPHENOLATE SODIUM 180 MG TABLET,DELAYED RELEASE: ORAL | 90 days supply | Qty: 360 | Fill #1

## 2022-12-12 NOTE — Unmapped (Signed)
duplicate

## 2022-12-12 NOTE — Unmapped (Signed)
[facility] Delivery Pharmacy Clinical Assessment & Refill Coordination Note    Patrick Vazquez, Roff: May 01, 1975  Phone: [phone] (home) [phone] (work)    All above HIPAA information was verified with patient.     Was a Nurse, learning disability used for this call? No    Specialty Medication(s):   Transplant:  mycophenolic acid 180mg  and tacrolimus 1mg      Current Outpatient Medications   Medication Sig Dispense Refill    acetaminophen (TYLENOL) 325 MG tablet Take 1-2 tablets (325-650 mg total) by mouth every four (4) hours as needed for pain. 100 tablet 0    amlodipine (NORVASC) 10 MG tablet Take 1 tablet (10 mg total) by mouth daily. 90 tablet 3    aspirin (ECOTRIN) 81 MG tablet Take 1 tablet (81 mg total) by mouth daily. 30 tablet 11    atorvastatin (LIPITOR) 40 MG tablet Take 1 tablet (40 mg total) by mouth every evening. 90 tablet 3    blood sugar diagnostic Strp Test Blood Sugar four times a day; E11.9 120 strip 5    carvedilol (COREG) 25 MG tablet Take 1 tablet (25 mg total) by mouth Two (2) times a day. 180 tablet 3    cholecalciferol, vitamin D3, (VITAMIN D3) 25 mcg (1,000 unit) Chew Chew 1 tablet daily.      HUMALOG U-100 INSULIN 100 unit/mL injection TAKE UP TO 130 UNITS DAILY VIA INSULIN PUMP (Patient not taking: Reported on 07/09/2022)      insulin lispro (HUMALOG) 100 unit/mL injection pen Inject 8 Units under the skin Three (3) times a day before meals. Dx code: E11.9 Diabetes (Patient taking differently: Inject 8 Units under the skin continuous. Dx code: E11.9 Diabetes) 15 mL 5    insulin NPH (HUMULIN,NOVOLIN) 100 unit/mL injection Inject 20 units daily at 8:00 AM and inject 28 units daily at 10 PM.      insulin syringe-needle U-100 (BD VEO INSULIN SYRINGE UF) 1/2 mL 31 gauge x 15/64 (6 mm) Syrg CHECK BLOOD SUGAR 4 TIMES DAILY. 100 each 11    mycophenolate (MYFORTIC) 180 MG EC tablet Take 2 tablets (360 mg total) by mouth Two (2) times a day. 360 tablet 3    olmesartan (BENICAR) 20 MG tablet Take 1 tablet (20 mg total) by mouth daily. 90 tablet 3    omeprazole (PRILOSEC) 40 MG capsule Take 1 capsule (40 mg total) by mouth daily. 90 capsule 3    tacrolimus (PROGRAF) 1 MG capsule Take 3 capsules (3 mg total) by mouth daily AND 2 capsules (2 mg total) nightly. 450 capsule 3     No current facility-administered medications for this visit.        Changes to medications: Abad reports no changes at this time.    No Known Allergies    Changes to allergies: No    SPECIALTY MEDICATION ADHERENCE     Tacrolimus 1mg   : 10 days of medicine on hand   Mycophenolate 180mg   : 95 days of medicine on hand       Medication Adherence    Patient reported X missed doses in the last month: 0  Specialty Medication: tacrolimus 1mg   Patient is on additional specialty medications: Yes  Additional Specialty Medications: Mycophenolate 180mg   Patient Reported Additional Medication X Missed Doses in the Last Month: 0  Patient is on more than two specialty medications: No          Specialty medication(s) dose(s) confirmed: Patient reports changes to the regimen as follows:  tac is now 3/2, new rx already on file      Are there any concerns with adherence? No    Adherence counseling provided? Not needed    CLINICAL MANAGEMENT AND INTERVENTION      Clinical Benefit Assessment:    Do you feel the medicine is effective or helping your condition? Yes    Clinical Benefit counseling provided? Not needed    Adverse Effects Assessment:    Are you experiencing any side effects? No    Are you experiencing difficulty administering your medicine? No    Quality of Life Assessment:    Quality of Life    Rheumatology  Oncology  Dermatology  Cystic Fibrosis          How many days over the past month did your transplant  keep you from your normal activities? For example, brushing your teeth or getting up in the morning. 0    Have you discussed this with your provider? Not needed    Acute Infection Status:    Acute infections noted within Epic: No active infections  Patient reported infection: None    Therapy Appropriateness:    Is therapy appropriate based on current medication list, adverse reactions, adherence, clinical benefit and progress toward achieving therapeutic goals? Yes, therapy is appropriate and should be continued     DISEASE/MEDICATION-SPECIFIC INFORMATION      N/A    Solid Organ Transplant: Not Applicable    PATIENT SPECIFIC NEEDS     Does the patient have any physical, cognitive, or cultural barriers? No    Is the patient high risk? Yes, patient is taking a REMS drug. Medication is dispensed in compliance with REMS program    Did the patient require a clinical intervention? No    Does the patient require physician intervention or other additional services (i.e., nutrition, smoking cessation, social work)? No    SOCIAL DETERMINANTS OF HEALTH     At the [facility], we have learned that life circumstances - like trouble affording food, housing, utilities, or transportation can affect the health of many of our patients.   That is why we wanted to ask: are you currently experiencing any life circumstances that are negatively impacting your health and/or quality of life? Patient declined to answer    Social Determinants of Health     Food Insecurity: Not on file   Internet Connectivity: Not on file   Housing/Utilities: Not on file   Tobacco Use: Medium Risk (09/01/2022)    Received from [facility]    Patient History     Smoking Tobacco Use: Former     Smokeless Tobacco Use: Never     Passive Exposure: Not on file   Transportation Needs: Not on file   Alcohol Use: Not on file   Interpersonal Safety: Unknown (12/12/2022)    Interpersonal Safety     Unsafe Where You Currently Live: Not on file     Physically Hurt by Anyone: Not on file     Abused by Anyone: Not on file   Physical Activity: Not on file   Intimate Partner Violence: Not on file   Stress: Not on file   Substance Use: Not on file   Social Connections: Not on file   Financial Resource Strain: Not on file   Depression: At risk (03/03/2022)    PHQ-2     PHQ-2 Score: 3   Health Literacy: Not on file       Would you be  willing to receive help with any of the needs that you have identified today? Not applicable       SHIPPING     Specialty Medication(s) to be Shipped:   Transplant: tacrolimus 1mg     Other medication(s) to be shipped: No additional medications requested for fill at this time     Changes to insurance: No    Delivery Scheduled: Yes, Expected medication delivery date: 12/18/2022.     Medication will be delivered via UPS to the confirmed prescription address in [facility]    The patient will receive a drug information handout for each medication shipped and additional FDA Medication Guides as required.  Verified that patient has previously received a Conservation officer, historic buildings and a Surveyor, mining.    The patient or caregiver noted above participated in the development of this care plan and knows that they can request review of or adjustments to the care plan at any time.      All of the patient's questions and concerns have been addressed.    Thad Ranger, PharmD   [facility] Delivery Pharm[facility] Specialty Pharmacist

## 2022-12-31 ENCOUNTER — Ambulatory Visit: Admit: 2022-12-31 | Discharge: 2023-01-01 | Payer: BLUE CROSS/BLUE SHIELD

## 2022-12-31 DIAGNOSIS — Z940 Kidney transplant status: Principal | ICD-10-CM

## 2022-12-31 LAB — CBC W/ AUTO DIFF
BASOPHILS ABSOLUTE COUNT: 0 10*9/L (ref 0.0–0.1)
BASOPHILS RELATIVE PERCENT: 0.4 %
EOSINOPHILS ABSOLUTE COUNT: 0.1 10*9/L (ref 0.0–0.5)
EOSINOPHILS RELATIVE PERCENT: 2.5 %
HEMATOCRIT: 40.2 % (ref 39.0–48.0)
HEMOGLOBIN: 13.3 g/dL (ref 12.9–16.5)
LYMPHOCYTES ABSOLUTE COUNT: 1.6 10*9/L (ref 1.1–3.6)
LYMPHOCYTES RELATIVE PERCENT: 32 %
MEAN CORPUSCULAR HEMOGLOBIN CONC: 33.1 g/dL (ref 32.0–36.0)
MEAN CORPUSCULAR HEMOGLOBIN: 27.5 pg (ref 25.9–32.4)
MEAN CORPUSCULAR VOLUME: 83.1 fL (ref 77.6–95.7)
MEAN PLATELET VOLUME: 9 fL (ref 6.8–10.7)
MONOCYTES ABSOLUTE COUNT: 0.4 10*9/L (ref 0.3–0.8)
MONOCYTES RELATIVE PERCENT: 8.5 %
NEUTROPHILS ABSOLUTE COUNT: 2.9 10*9/L (ref 1.8–7.8)
NEUTROPHILS RELATIVE PERCENT: 56.6 %
PLATELET COUNT: 241 10*9/L (ref 150–450)
RED BLOOD CELL COUNT: 4.84 10*12/L (ref 4.26–5.60)
RED CELL DISTRIBUTION WIDTH: 14.4 % (ref 12.2–15.2)
WBC ADJUSTED: 5.1 10*9/L (ref 3.6–11.2)

## 2022-12-31 LAB — ALBUMIN / CREATININE URINE RATIO
ALBUMIN QUANT URINE: 9.6 mg/dL
ALBUMIN/CREATININE RATIO: 70.6 ug/mg — ABNORMAL HIGH (ref 0.0–30.0)
CREATININE, URINE: 136 mg/dL

## 2022-12-31 LAB — COMPREHENSIVE METABOLIC PANEL
ALBUMIN: 3.4 g/dL (ref 3.4–5.0)
ALKALINE PHOSPHATASE: 95 U/L (ref 46–116)
ALT (SGPT): 14 U/L (ref 10–49)
ANION GAP: 7 mmol/L (ref 5–14)
BILIRUBIN TOTAL: 0.5 mg/dL (ref 0.3–1.2)
BLOOD UREA NITROGEN: 21 mg/dL (ref 9–23)
BUN / CREAT RATIO: 14
CALCIUM: 9.9 mg/dL (ref 8.7–10.4)
CHLORIDE: 110 mmol/L — ABNORMAL HIGH (ref 98–107)
CO2: 22.3 mmol/L (ref 20.0–31.0)
CREATININE: 1.46 mg/dL — ABNORMAL HIGH
EGFR CKD-EPI (2021) MALE: 59 mL/min/{1.73_m2} — ABNORMAL LOW (ref >=60)
GLUCOSE RANDOM: 207 mg/dL — ABNORMAL HIGH (ref 70–179)
PROTEIN TOTAL: 7.4 g/dL (ref 5.7–8.2)
SODIUM: 139 mmol/L (ref 135–145)

## 2022-12-31 LAB — URINALYSIS WITH MICROSCOPY
BACTERIA: NONE SEEN /HPF
BILIRUBIN UA: NEGATIVE
BLOOD UA: NEGATIVE
GLUCOSE UA: 100 — AB
KETONES UA: NEGATIVE
LEUKOCYTE ESTERASE UA: NEGATIVE
NITRITE UA: NEGATIVE
PH UA: 5.5 (ref 5.0–9.0)
RBC UA: <1 /HPF (ref <=3)
SPECIFIC GRAVITY UA: 1.015 (ref 1.003–1.030)
SQUAMOUS EPITHELIAL: <1 /HPF (ref 0–5)
UROBILINOGEN UA: <2
WBC UA: <1 /HPF (ref <=2)

## 2022-12-31 LAB — PROTEIN / CREATININE RATIO, URINE
CREATININE, URINE: 136 mg/dL
PROTEIN URINE: 30.4 mg/dL
PROTEIN/CREAT RATIO, URINE: 0.224

## 2022-12-31 LAB — CMV DNA, QUANTITATIVE, PCR: CMV VIRAL LD: NOT DETECTED

## 2022-12-31 LAB — PHOSPHORUS: PHOSPHORUS: 2.7 mg/dL (ref 2.4–5.1)

## 2022-12-31 LAB — MAGNESIUM: MAGNESIUM: 1.4 mg/dL — ABNORMAL LOW (ref 1.6–2.6)

## 2023-01-01 DIAGNOSIS — Z79899 Other long term (current) drug therapy: Principal | ICD-10-CM

## 2023-01-01 DIAGNOSIS — Z940 Kidney transplant status: Principal | ICD-10-CM

## 2023-01-01 LAB — TACROLIMUS LEVEL, TROUGH: TACROLIMUS, TROUGH: 4.9 ng/mL — ABNORMAL LOW (ref 5.0–15.0)

## 2023-01-02 ENCOUNTER — Ambulatory Visit: Admit: 2023-01-02 | Discharge: 2023-01-03 | Payer: BLUE CROSS/BLUE SHIELD

## 2023-01-02 DIAGNOSIS — Z940 Kidney transplant status: Principal | ICD-10-CM

## 2023-01-02 DIAGNOSIS — Z79899 Other long term (current) drug therapy: Principal | ICD-10-CM

## 2023-01-02 DIAGNOSIS — T8619 Other complication of kidney transplant: Principal | ICD-10-CM

## 2023-01-02 DIAGNOSIS — N186 End stage renal disease: Principal | ICD-10-CM

## 2023-01-02 DIAGNOSIS — Z23 Encounter for immunization: Principal | ICD-10-CM

## 2023-01-02 NOTE — Unmapped (Signed)
Transplant Coordinator, Clinic Visit   Pt seen today by transplant nephrology for follow up, reviewed medications and symptoms.          01/02/23 1423   BP: 141/93   Pulse: 75   Temp: 36.4 ??C (97.6 ??F)   Weight: (!) 123.8 kg (273 lb)   Height: 185.4 cm (6' 0.99)   PainSc: 0-No pain       Assessment  BP @ Home: 120s/90s  BG: ~170s, follows endo  HA/Dizziness/Lightheaded: No  Hand tremors: No  Numbness/tingling: No  Fevers/Chills/sweats: No  Chest Pain/SOB: No  N/V/Heartburn: No  Diarrhea/constipation: No  UTI symptoms: No  Swelling: No  Pain: 0  Incision/Drain/Foley: N/A    Good appetite; reports adequate hydration.    Any new medications? No  Immunosuppressant last taken: 8-8:30 PM    Immunization status: Flu today, declined covid

## 2023-01-02 NOTE — Unmapped (Addendum)
Great to see you today!    To interpret your labs:  Kidney function is estimated by creatinine. Typical creatinine for you is 1.4-1.8. If it is higher that means we need to think about why!  Tacrolimus: goal level 5-7 when checked 12 hours from your last dose.    Vaccines:  - COVID vaccine given today 01/02/23  - we recommend you get the flu vaccine and the Shingrix vaccine at your pharmacy or PCP

## 2023-01-02 NOTE — Unmapped (Signed)
[place] [facility]   TRANSPLANT FOLLOW UP     PCP: Teresita Madura, MD   Kidney transplant coordinator: Francene Finders    Date of Visit at Transplant clinic: 01/02/2023     Assessment/Recommendations:     # s/p deceased donor kidney transplant 01/28/2016   Native kidney disease T1DM.  Graft function: Cr 1.4-1.8, without proteinuria.  DSAs: none detected Jan 2024  Has LUE AVF which thrombosed, no issues    # Immunosuppression  Tacrolimus (Prograf) 3 mg AM, 2 mg PM, goal trough 5-7 ng/mL   Mycophenolate (Myfortic) 360 mg BID    # Acute issues today  none    # BP management   Goal 130/80, as tolerated.  - amlodipine 10 mg daily  - carvedilol 25 mg daily  - olmesartan 20 mg daily    # Infectious disease  CMV D+/R-, EBV D+/R+    # Anemia screening  Hb normal    # Cardiovascular: primary CAD prevention  # History of stroke Sept 2015 due to uncontrolled HTN - LLE weakness, treated at [facility], with full recovery.  -The 10-year ASCVD risk score (Arnett DK, et al., 2019) is: 17.5%  - Atorvastatin 40 mg daily  - ASA 81 mg daily    # CKD-BMD  Ca and phos normal  PTH 99.6 07/31/21    # Electrolytes  Mg not needing supp    # Comorbidities   Type 1 DM: HbA1c 7.2 as of Jan 2024   - NPH 38 units AM, 30units PM  - Humalog SSI meals  Endocrinologist: Dr. Tedd S[facility] in [place]), sees q3 months    Weight management:  - had weight loss benefit with Trulicity, but stopped due to diarrhea with increased dose  - stopped Ozempic because of GI symptoms  - follows up with nutritionist Greta Doom) and weight management clinic for weight loss    # Immunizations  - Recommended the Shingrix vaccine  Immunization History   Administered Date(s) Administered    COVID-19 VACCINE,MRNA(MODERNA)(PF) 04/27/2019, 06/01/2019, 11/01/2019, 06/27/2020, 12/19/2021    Covid-19 Vac, (16yr+) (Spikevax) Monovalent Moderna 12/19/2021    Influenza Vaccine Quad(IM)6 MO-Adult(PF) 03/03/2017, 10/27/2017, 11/25/2018, 01/04/2020, 02/27/2021, 12/05/2021    Influenza Virus Vaccine, unspecified formulation 11/09/2015, 03/03/2017, 10/27/2017    Pneumococcal Conjugate 20-valent 03/07/2022       # Cancer screening  Colonoscopy: scheduled for 03/31/22  Skin: recommend yearly dermatology evaluation    # Follow up:  Labs monthly  Visits return in 6 months      Kidney Transplant History:   Date of Transplant: 01/28/2016 (Kidney)  Type of Transplant: DDKT, DCD  KDPI: 89%  Cold ischemic time: 1,046 minutes (17hr )  Warm ischemic time: 49 minutes  cPRA: 0%  HLA match:   Blood type: Donor O, Recipient O POS  ID: CMV D+/R-, EBV D+/R+  Native Kidney Disease: T1DM   Native kidney biopsy: no   Pre-transplant dialysis course: HD since 12/08/2012  Post-Transplant Course:    Delayed graft function requiring dialysis: Yes   Other complications: no  Prior Transplants: None  Induction: Campath  Early steroid withdrawal: Yes    Biopsies:   Zero-Hour Biopsy: Moderate to severe arteriosclerosis observed in some of the vascular cross sections; Mild acute tubular injury.    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    Review of Systems:   A 12-system review was negative except as documented in the HPI.    Physical Exam:     BP 141/93 (BP Site: R Arm, BP Position: Sitting, BP Cuff Size: Medium)  - Pulse 75  - Temp 36.4 ??C (97.6 ??F) (Temporal)  - Ht 185.4 cm (6' 0.99)  - Wt (!) 123.8 kg (273 lb)  - BMI 36.03 kg/m??   Constitutional:  Well-appearing in NAD  Eyes:  anicteric sclerae  ENT:  MMM  CV:  RRR, no m/r/g, no JVD, extremities WWP with no edema  Resp:  Good air movement, CTAB  GI:  Abdomen soft, NTND, +bs  MSK:  Grossly normal, exam is limited  Skin:  Normal turgor, no rash  Neuro:  Grossly normal, exam is limited  Psych:  Normal affect    Allergies: No Known Allergies     Current Medications:   Current Outpatient Medications   Medication Sig Dispense Refill    acetaminophen (TYLENOL) 325 MG tablet Take 1-2 tablets (325-650 mg total) by mouth every four (4) hours as needed for pain. 100 tablet 0    amlodipine (NORVASC) 10 MG tablet Take 1 tablet (10 mg total) by mouth daily. 90 tablet 3    aspirin (ECOTRIN) 81 MG tablet Take 1 tablet (81 mg total) by mouth daily. 30 tablet 11    atorvastatin (LIPITOR) 40 MG tablet Take 1 tablet (40 mg total) by mouth every evening. 90 tablet 3    blood sugar diagnostic Strp Test Blood Sugar four times a day; E11.9 120 strip 5    carvedilol (COREG) 25 MG tablet Take 1 tablet (25 mg total) by mouth Two (2) times a day. 180 tablet 3    cholecalciferol, vitamin D3, (VITAMIN D3) 25 mcg (1,000 unit) Chew Chew 1 tablet daily.      HUMALOG U-100 INSULIN 100 unit/mL injection       insulin NPH (HUMULIN,NOVOLIN) 100 unit/mL injection Inject 20 units daily at 8:00 AM and inject 28 units daily at 10 PM.      insulin syringe-needle U-100 (BD VEO INSULIN SYRINGE UF) 1/2 mL 31 gauge x 15/64 (6 mm) Syrg CHECK BLOOD SUGAR 4 TIMES DAILY. 100 each 11    mycophenolate (MYFORTIC) 180 MG EC tablet Take 2 tablets (360 mg total) by mouth Two (2) times a day. 360 tablet 3    olmesartan (BENICAR) 20 MG tablet Take 1 tablet (20 mg total) by mouth daily. 90 tablet 3    omeprazole (PRILOSEC) 40 MG capsule Take 1 capsule (40 mg total) by mouth daily. 90 capsule 3    tacrolimus (PROGRAF) 1 MG capsule Take 3 capsules (3 mg total) by mouth daily AND 2 capsules (2 mg total) nightly. 450 capsule 3    insulin lispro (HUMALOG) 100 unit/mL injection pen Inject 8 Units under the skin Three (3) times a day before meals. Dx code: E11.9 Diabetes (Patient taking differently: Inject 8 Units under the skin continuous. Dx code: E11.9 Diabetes) 15 mL 5     No current facility-administered medications for this visit.       Past Medical History:   Past Medical History:   Diagnosis Date    CVA (cerebral vascular accident) (CMS-HCC) 10/2013    Diabetes mellitus (CMS-HCC)     Type 1    Diabetic ketoacidosis associated with type 1 diabetes mellitus (CMS-HCC) 02/28/2016  ESRD (end stage renal disease) (CMS-HCC)     HTN (hypertension)         Laboratory studies:   Reviewed recent results.

## 2023-01-13 NOTE — Unmapped (Signed)
Patient was set up for a refill call from [facility] this week, however just received 90ds of both specialty medications last month ( mycophenolate on 10/17 and tacrolimus on 10/23).  Refill call date adjusted accordingly

## 2023-02-23 MED FILL — CARVEDILOL 25 MG TABLET: ORAL | 90 days supply | Qty: 180 | Fill #1

## 2023-02-23 MED FILL — OMEPRAZOLE 40 MG CAPSULE,DELAYED RELEASE: ORAL | 90 days supply | Qty: 90 | Fill #3

## 2023-02-23 MED FILL — ATORVASTATIN 40 MG TABLET: ORAL | 90 days supply | Qty: 90 | Fill #1

## 2023-02-23 MED FILL — OLMESARTAN 20 MG TABLET: ORAL | 90 days supply | Qty: 90 | Fill #1

## 2023-02-26 NOTE — Unmapped (Signed)
[place] [facility] Clinical Intervention    Type of intervention:  Tracking information    Medication involved: Atorvastatin    Problem identified: Not delivered 02/24/2023    Intervention performed: Gave tracking number     [id number]     Follow-up needed: No    Approximate time spent: 10-15 minutes    Clinical evidence used to support intervention: Chart review    Result of the intervention: Improved medication adherence    Elnora Morrison, PharmD   [facility] Pharm[facility] Specialty Pharmacist

## 2023-02-27 DIAGNOSIS — Z940 Kidney transplant status: Principal | ICD-10-CM

## 2023-02-27 DIAGNOSIS — Z79899 Other long term (current) drug therapy: Principal | ICD-10-CM

## 2023-02-27 MED ORDER — OMEPRAZOLE 40 MG CAPSULE,DELAYED RELEASE
ORAL_CAPSULE | Freq: Every day | ORAL | 3 refills | 90.00 days | Status: CP
Start: 2023-02-27 — End: 2024-02-27
  Filled 2023-02-27: qty 90, 90d supply, fill #0

## 2023-02-27 MED FILL — CARVEDILOL 25 MG TABLET: ORAL | 90 days supply | Qty: 180 | Fill #2

## 2023-02-27 MED FILL — ATORVASTATIN 40 MG TABLET: ORAL | 90 days supply | Qty: 90 | Fill #2

## 2023-02-27 MED FILL — OLMESARTAN 20 MG TABLET: ORAL | 90 days supply | Qty: 90 | Fill #2

## 2023-02-27 NOTE — Unmapped (Signed)
[facility] - Replacement Package    Patrick Vazquez 's package containing atorvastatin, carvedilol, olmesartan, and omeprazole is being replaced due to shipped to incorrect address.    UPS Tracking Number: [id number]    Replacement package approved and scheduled for delivery via UPS to the prescription address in Epic WAM. Expected delivery 02/28/23

## 2023-03-03 NOTE — Unmapped (Signed)
Patrick Vazquez has been contacted in regards to their refill of Tacrolimus & Mycophenolate . At this time, they have declined refill due to  he requested a follow-up in 2 weeks  . Refill assessment call date has been updated per the patient's request.

## 2023-03-10 DIAGNOSIS — Z940 Kidney transplant status: Principal | ICD-10-CM

## 2023-03-10 DIAGNOSIS — Z79899 Other long term (current) drug therapy: Principal | ICD-10-CM

## 2023-03-11 ENCOUNTER — Ambulatory Visit: Admit: 2023-03-11 | Discharge: 2023-03-11 | Payer: BLUE CROSS/BLUE SHIELD

## 2023-03-11 ENCOUNTER — Inpatient Hospital Stay: Admit: 2023-03-11 | Discharge: 2023-03-11 | Payer: BLUE CROSS/BLUE SHIELD

## 2023-03-11 DIAGNOSIS — Z23 Encounter for immunization: Principal | ICD-10-CM

## 2023-03-11 DIAGNOSIS — Z940 Kidney transplant status: Principal | ICD-10-CM

## 2023-03-11 DIAGNOSIS — Z79899 Other long term (current) drug therapy: Principal | ICD-10-CM

## 2023-03-11 LAB — COMPREHENSIVE METABOLIC PANEL
ALBUMIN: 3.9 g/dL (ref 3.4–5.0)
ALKALINE PHOSPHATASE: 107 U/L (ref 46–116)
ALT (SGPT): 32 U/L (ref 10–49)
ANION GAP: 13 mmol/L (ref 5–14)
AST (SGOT): 36 U/L — ABNORMAL HIGH (ref <=34)
BILIRUBIN TOTAL: 0.5 mg/dL (ref 0.3–1.2)
BLOOD UREA NITROGEN: 37 mg/dL — ABNORMAL HIGH (ref 9–23)
BUN / CREAT RATIO: 19
CALCIUM: 10.4 mg/dL (ref 8.7–10.4)
CHLORIDE: 103 mmol/L (ref 98–107)
CO2: 21.8 mmol/L (ref 20.0–31.0)
CREATININE: 2 mg/dL — ABNORMAL HIGH (ref 0.73–1.18)
EGFR CKD-EPI (2021) MALE: 40 mL/min/{1.73_m2} — ABNORMAL LOW (ref >=60)
GLUCOSE RANDOM: 146 mg/dL (ref 70–179)
POTASSIUM: 4.4 mmol/L (ref 3.4–4.8)
PROTEIN TOTAL: 8.6 g/dL — ABNORMAL HIGH (ref 5.7–8.2)
SODIUM: 138 mmol/L (ref 135–145)

## 2023-03-11 LAB — BILIRUBIN, DIRECT: BILIRUBIN DIRECT: 0.2 mg/dL (ref 0.00–0.30)

## 2023-03-11 LAB — BK VIRUS QUANTITATIVE PCR, BLOOD: BK BLOOD RESULT: NOT DETECTED

## 2023-03-11 LAB — URINALYSIS WITH MICROSCOPY
BACTERIA: NONE SEEN /HPF
BILIRUBIN UA: NEGATIVE
BLOOD UA: NEGATIVE
GLUCOSE UA: 1000 — AB
LEUKOCYTE ESTERASE UA: NEGATIVE
NITRITE UA: NEGATIVE
PH UA: 5.5 (ref 5.0–9.0)
PROTEIN UA: NEGATIVE
RBC UA: <1 /HPF (ref <=3)
SPECIFIC GRAVITY UA: 1.012 (ref 1.003–1.030)
SQUAMOUS EPITHELIAL: <1 /HPF (ref 0–5)
UROBILINOGEN UA: <2
WBC UA: 1 /HPF (ref <=2)

## 2023-03-11 LAB — LIPID PANEL
CHOLESTEROL/HDL RATIO SCREEN: 3.8 (ref 1.0–4.5)
CHOLESTEROL: 152 mg/dL (ref <=200)
HDL CHOLESTEROL: 40 mg/dL (ref 40–60)
LDL CHOLESTEROL CALCULATED: 79 mg/dL (ref 40–99)
NON-HDL CHOLESTEROL: 112 mg/dL (ref 70–130)
TRIGLYCERIDES: 163 mg/dL — ABNORMAL HIGH (ref 0–150)
VLDL CHOLESTEROL CAL: 32.6 mg/dL (ref 11–50)

## 2023-03-11 LAB — CBC W/ AUTO DIFF
BASOPHILS ABSOLUTE COUNT: 0 10*9/L (ref 0.0–0.1)
BASOPHILS RELATIVE PERCENT: 0.5 %
EOSINOPHILS ABSOLUTE COUNT: 0.1 10*9/L (ref 0.0–0.5)
EOSINOPHILS RELATIVE PERCENT: 1.4 %
HEMATOCRIT: 42.5 % (ref 39.0–48.0)
HEMOGLOBIN: 14 g/dL (ref 12.9–16.5)
LYMPHOCYTES ABSOLUTE COUNT: 2 10*9/L (ref 1.1–3.6)
LYMPHOCYTES RELATIVE PERCENT: 41.9 %
MEAN CORPUSCULAR HEMOGLOBIN CONC: 33.1 g/dL (ref 32.0–36.0)
MEAN CORPUSCULAR HEMOGLOBIN: 28.1 pg (ref 25.9–32.4)
MEAN CORPUSCULAR VOLUME: 85.1 fL (ref 77.6–95.7)
MEAN PLATELET VOLUME: 8.9 fL (ref 6.8–10.7)
MONOCYTES ABSOLUTE COUNT: 0.5 10*9/L (ref 0.3–0.8)
MONOCYTES RELATIVE PERCENT: 9.8 %
NEUTROPHILS ABSOLUTE COUNT: 2.3 10*9/L (ref 1.8–7.8)
NEUTROPHILS RELATIVE PERCENT: 46.4 %
PLATELET COUNT: 242 10*9/L (ref 150–450)
RED BLOOD CELL COUNT: 4.99 10*12/L (ref 4.26–5.60)
RED CELL DISTRIBUTION WIDTH: 15.2 % (ref 12.2–15.2)
WBC ADJUSTED: 4.9 10*9/L (ref 3.6–11.2)

## 2023-03-11 LAB — EBV QUANTITATIVE PCR, BLOOD: EBV VIRAL LOAD RESULT: NOT DETECTED

## 2023-03-11 LAB — PROTEIN / CREATININE RATIO, URINE
CREATININE, URINE: 118.2 mg/dL
PROTEIN URINE: 14.8 mg/dL
PROTEIN/CREAT RATIO, URINE: 0.125

## 2023-03-11 LAB — ALBUMIN / CREATININE URINE RATIO
ALBUMIN QUANT URINE: 0.7 mg/dL
ALBUMIN/CREATININE RATIO: 5.9 ug/mg (ref 0.0–30.0)
CREATININE, URINE: 118.9 mg/dL

## 2023-03-11 LAB — MAGNESIUM: MAGNESIUM: 2.1 mg/dL (ref 1.6–2.6)

## 2023-03-11 LAB — PARATHYROID HORMONE (PTH): PARATHYROID HORMONE INTACT: 94.5 pg/mL — ABNORMAL HIGH (ref 18.5–88.1)

## 2023-03-11 LAB — TACROLIMUS LEVEL, TROUGH: TACROLIMUS, TROUGH: 3.8 ng/mL — ABNORMAL LOW (ref 5.0–15.0)

## 2023-03-11 LAB — FERRITIN: FERRITIN: 437.2 ng/mL — ABNORMAL HIGH (ref 10.5–307.3)

## 2023-03-11 LAB — HEMOGLOBIN A1C
ESTIMATED AVERAGE GLUCOSE: 174 mg/dL
HEMOGLOBIN A1C: 7.7 % — ABNORMAL HIGH (ref 4.8–5.6)

## 2023-03-11 LAB — IRON & TIBC
IRON SATURATION: 32 % (ref 20–55)
IRON: 92 ug/dL (ref 65–175)
TOTAL IRON BINDING CAPACITY: 288 ug/dL (ref 250–425)

## 2023-03-11 LAB — CMV DNA, QUANTITATIVE, PCR: CMV VIRAL LD: NOT DETECTED

## 2023-03-11 LAB — PHOSPHORUS: PHOSPHORUS: 2.5 mg/dL (ref 2.4–5.1)

## 2023-03-11 NOTE — Unmapped (Signed)
[facility]   TRANSPLANT FOLLOW UP     PCP: Osie Cheeks, FNP   Kidney transplant coordinator: Francene Finders    Date of Visit at Transplant clinic: 03/11/2023     Assessment/Recommendations:     # s/p deceased donor kidney transplant 01/28/2016   Native kidney disease T1DM.  Graft function: Cr 1.4-1.8, without proteinuria. Cr up to 2.0, he thinks less hydrated, will repeat labs after PO hydration  DSAs: none detected Jan 2024  Has LUE AVF which thrombosed, no issues    # Immunosuppression  Tacrolimus (Prograf) 3 mg AM, 2 mg PM, goal trough 5-7 ng/mL    - level 1/15 will be well timed level  Mycophenolate (Myfortic) 360 mg BID    # Acute issues today  none    # BP management   Goal 130/80, as tolerated.  - amlodipine 10 mg daily  - carvedilol 25 mg daily  - olmesartan 20 mg daily    # Infectious disease  CMV D+/R-, EBV D+/R+    # Anemia screening  Hb normal    # Cardiovascular: primary CAD prevention  # History of stroke Sept 2015 due to uncontrolled HTN - LLE weakness, treated at [facility], with full recovery.  -The 10-year ASCVD risk score (Arnett DK, et al., 2019) is: 17.5%  - Atorvastatin 40 mg daily  - ASA 81 mg daily    # CKD-BMD  Ca and phos normal  PTH 99.6 07/31/21    # Electrolytes  Mg not needing supp    # Comorbidities   Type 1 DM: HbA1c 7.2 as of Jan 2024   - NPH 38 units AM, 30units PM  - Humalog SSI meals  Endocrinologist: Dr. Tedd Sias [facility] clinic in [place]), sees q3 months    Weight management:  - had weight loss benefit with Trulicity, but stopped due to diarrhea with increased dose  - stopped Ozempic because of GI symptoms  - follows up with nutritionist Greta Doom) and weight management clinic for weight loss    # Immunizations  - Recommended the Shingrix vaccine  Immunization History   Administered Date(s) Administered    COVID-19 VACCINE,MRNA(MODERNA)(PF) 04/27/2019, 06/01/2019, 11/01/2019, 06/27/2020, 12/19/2021    Covid-19 Vac, (38yr+) (Comirnaty) Mrna Pfizer  01/02/2023    Covid-19 Vac, (29yr+) (Spikevax) Monovalent Moderna 12/19/2021    Influenza Vaccine Quad(IM)6 MO-Adult(PF) 03/03/2017, 10/27/2017, 11/25/2018, 01/04/2020, 02/27/2021, 12/05/2021    Influenza Virus Vaccine, unspecified formulation 11/09/2015, 03/03/2017, 10/27/2017    Pneumococcal Conjugate 20-valent 03/07/2022       # Cancer screening  Colonoscopy: scheduled for 03/31/22  Skin: recommend yearly dermatology evaluation    # Follow up:  Labs monthly  Visits return in 6 months      Kidney Transplant History:   Date of Transplant: 01/28/2016 (Kidney)  Type of Transplant: DDKT, DCD  KDPI: 89%  Cold ischemic time: 1,046 minutes (17hr )  Warm ischemic time: 49 minutes  cPRA: 0%  HLA match:   Blood type: Donor O, Recipient O POS  ID: CMV D+/R-, EBV D+/R+  Native Kidney Disease: T1DM   Native kidney biopsy: no   Pre-transplant dialysis course: HD since 12/08/2012  Post-Transplant Course:    Delayed graft function requiring dialysis: Yes   Other complications: no  Prior Transplants: None  Induction: Campath  Early steroid withdrawal: Yes    Biopsies:   Zero-Hour Biopsy: Moderate to severe arteriosclerosis observed in some of the vascular cross sections; Mild acute tubular injury.  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    Review of Systems:   A 12-system review was negative except as documented in the HPI.    Physical Exam:     There were no vitals taken for this visit.  Constitutional:  Well-appearing in NAD  Eyes:  anicteric sclerae  ENT:  MMM  CV:  RRR, no m/r/g, no JVD, extremities WWP with no edema  Resp:  Good air movement, CTAB  GI:  Abdomen soft, NTND, +bs  MSK:  Grossly normal, exam is limited  Skin:  Normal turgor, no rash  Neuro:  Grossly normal, exam is limited  Psych:  Normal affect    Allergies: No Known Allergies     Current Medications:   Current Outpatient Medications   Medication Sig Dispense Refill    acetaminophen (TYLENOL) 325 MG tablet Take 1-2 tablets (325-650 mg total) by mouth every four (4) hours as needed for pain. 100 tablet 0    amlodipine (NORVASC) 10 MG tablet Take 1 tablet (10 mg total) by mouth daily. 90 tablet 3    aspirin (ECOTRIN) 81 MG tablet Take 1 tablet (81 mg total) by mouth daily. 30 tablet 11    atorvastatin (LIPITOR) 40 MG tablet Take 1 tablet (40 mg total) by mouth every evening. 90 tablet 3    blood sugar diagnostic Strp Test Blood Sugar four times a day; E11.9 120 strip 5    carvedilol (COREG) 25 MG tablet Take 1 tablet (25 mg total) by mouth Two (2) times a day. 180 tablet 3    cholecalciferol, vitamin D3, (VITAMIN D3) 25 mcg (1,000 unit) Chew Chew 1 tablet daily.      HUMALOG U-100 INSULIN 100 unit/mL injection       insulin lispro (HUMALOG) 100 unit/mL injection pen Inject 8 Units under the skin Three (3) times a day before meals. Dx code: E11.9 Diabetes (Patient taking differently: Inject 8 Units under the skin continuous. Dx code: E11.9 Diabetes) 15 mL 5    insulin NPH (HUMULIN,NOVOLIN) 100 unit/mL injection Inject 20 units daily at 8:00 AM and inject 28 units daily at 10 PM.      insulin syringe-needle U-100 (BD VEO INSULIN SYRINGE UF) 1/2 mL 31 gauge x 15/64 (6 mm) Syrg CHECK BLOOD SUGAR 4 TIMES DAILY. 100 each 11    mycophenolate (MYFORTIC) 180 MG EC tablet Take 2 tablets (360 mg total) by mouth Two (2) times a day. 360 tablet 3    olmesartan (BENICAR) 20 MG tablet Take 1 tablet (20 mg total) by mouth daily. 90 tablet 3    omeprazole (PRILOSEC) 40 MG capsule Take 1 capsule (40 mg total) by mouth daily. 90 capsule 3    tacrolimus (PROGRAF) 1 MG capsule Take 3 capsules (3 mg total) by mouth daily AND 2 capsules (2 mg total) nightly. 450 capsule 3     No current facility-administered medications for this visit.       Past Medical History:   Past Medical History:   Diagnosis Date    CVA (cerebral vascular accident) (CMS-HCC) 10/2013    Diabetes mellitus (CMS-HCC)     Type 1    Diabetic ketoacidosis associated with type 1 diabetes mellitus (CMS-HCC) 02/28/2016  ESRD (end stage renal disease) (CMS-HCC)     HTN (hypertension)         Laboratory studies:   Reviewed recent results.

## 2023-03-11 NOTE — Unmapped (Signed)
For good hydration: recommend 70-80 oz of healthy fluids daily (a mix of water and other non-sugary drinks). This is about 4 standard water bottles.

## 2023-03-11 NOTE — Unmapped (Signed)
Transplant Coordinator, Clinic Visit   Pt seen today by transplant nephrology for follow up, reviewed medications and symptoms.     There were no vitals filed for this visit.    Assessment  BP @ Home: 130s/80s  BG: 200s in AM, 130-150s during the day - Follow up w/ endo next [facility]  HA/Dizziness/Lightheaded: No  Hand tremors: No  Numbness/tingling: No  Fevers/Chills/sweats: No  Chest Pain/SOB: No  N/V/Heartburn: No  Diarrhea/constipation: No  UTI symptoms: No  Swelling: No  Pain: 0  Incision/Drain/Foley: N/A    Good appetite; reports adequate hydration.    Any new medications? No  Immunosuppressant last taken: 9 PM    Immunization status: Flu today, covid completed    Repeat labs next week d/t increased Cr

## 2023-03-12 DIAGNOSIS — Z940 Kidney transplant status: Principal | ICD-10-CM

## 2023-03-12 LAB — VITAMIN D 25 HYDROXY: VITAMIN D, TOTAL (25OH): 17.9 ng/mL — ABNORMAL LOW (ref 20.0–80.0)

## 2023-03-12 MED ORDER — TACROLIMUS 1 MG CAPSULE, IMMEDIATE-RELEASE
ORAL_CAPSULE | ORAL | 3 refills | 90.00 days | Status: CP
Start: 2023-03-12 — End: 2024-03-11
  Filled 2023-03-17: qty 540, 90d supply, fill #0

## 2023-03-12 NOTE — Unmapped (Signed)
Reviewed tacrolimus trough 3.8 with Dr. Elvera Maria - Recommended to increase to 3 mg BID. Patient verbalized understanding, will repeat labs in 1-2 weeks. No other needs at this time.

## 2023-03-13 NOTE — Unmapped (Signed)
[facility] Refill Coordination Note    Specialty Medication(s) to be Shipped:   Transplant: mycophenolate mofetil 180mg  and tacrolimus 1mg     Other medication(s) to be shipped:  amlodipine     Patrick Vazquez, DOB: 01/29/1976  Phone: [phone] (home) [phone] (work)      All above HIPAA information was verified with patient.     Was a Nurse, learning disability used for this call? No    Completed refill call assessment today to schedule patient's medication shipment from the [facility]  [phone]).  All relevant notes have been reviewed.     Specialty medication(s) and dose(s) confirmed: Regimen is correct and unchanged.   Changes to medications: [name] reports no changes at this time.  Changes to insurance: No  New side effects reported not previously addressed with a pharmacist or physician: None reported  Questions for the pharmacist: No    Confirmed patient received a Conservation officer, historic buildings and a Surveyor, mining with first shipment. The patient will receive a drug information handout for each medication shipped and additional FDA Medication Guides as required.       DISEASE/MEDICATION-SPECIFIC INFORMATION        N/A    SPECIALTY MEDICATION ADHERENCE     Medication Adherence    Specialty Medication: tacrolimus 1 MG capsule (PROGRAF)  Patient is on additional specialty medications: Yes  Additional Specialty Medications: mycophenolate 180 MG EC tablet (MYFORTIC)  Patient Reported Additional Medication X Missed Doses in the Last Month: 0  Informant: patient              Were doses missed due to medication being on hold? No     mycophenolate 180 MG EC tablet (MYFORTIC): 7-10 days of medicine on hand    tacrolimus 1 MG capsule (PROGRAF): 7-10 days of medicine on hand       REFERRAL TO PHARMACIST     Referral to the pharmacist: Not needed      [facility]     Shipping address confirmed in Epic.       Delivery Scheduled: Yes, Expected medication delivery date: 03/18/23.     Medication will be delivered via UPS to the prescription address in Epic WAM.    Patrick Vazquez   [facility]  Specialty Technician

## 2023-03-17 MED FILL — MYCOPHENOLATE SODIUM 180 MG TABLET,DELAYED RELEASE: ORAL | 90 days supply | Qty: 360 | Fill #2

## 2023-03-17 MED FILL — AMLODIPINE 10 MG TABLET: ORAL | 90 days supply | Qty: 90 | Fill #1

## 2023-03-18 LAB — HLA DS POST TRANSPLANT
ANTI-DONOR DRW #1 MFI: 39 MFI
ANTI-DONOR DRW #2 MFI: 5 MFI
ANTI-DONOR HLA-A #1 MFI: 20 MFI
ANTI-DONOR HLA-A #2 MFI: 35 MFI
ANTI-DONOR HLA-B #1 MFI: 1 MFI
ANTI-DONOR HLA-B #2 MFI: 19 MFI
ANTI-DONOR HLA-C #1 MFI: 12 MFI
ANTI-DONOR HLA-C #2 MFI: 0 MFI
ANTI-DONOR HLA-DQB #1 MFI: 127 MFI
ANTI-DONOR HLA-DQB #2 MFI: 133 MFI
ANTI-DONOR HLA-DR #2 MFI: 37 MFI

## 2023-03-18 LAB — FSAB CLASS 1 ANTIBODY SPECIFICITY: HLA CLASS 1 ANTIBODY RESULT: NEGATIVE

## 2023-03-18 LAB — FSAB CLASS 2 ANTIBODY SPECIFICITY: HLA CL2 AB RESULT: NEGATIVE

## 2023-03-19 LAB — VITAMIN D 1,25 DIHYDROXY
VITAMIN D 1,25 D2: 13 pg/mL
VITAMIN D 1,25 D3: 29 pg/mL
VITAMIN D,1,25 (OH) 2, TOTAL: 42 pg/mL

## 2023-03-31 ENCOUNTER — Ambulatory Visit: Admit: 2023-03-31 | Discharge: 2023-04-01 | Payer: BLUE CROSS/BLUE SHIELD

## 2023-03-31 DIAGNOSIS — Z940 Kidney transplant status: Principal | ICD-10-CM

## 2023-03-31 LAB — CBC W/ AUTO DIFF
BASOPHILS ABSOLUTE COUNT: 0 10*9/L (ref 0.0–0.1)
BASOPHILS RELATIVE PERCENT: 0.7 %
EOSINOPHILS ABSOLUTE COUNT: 0.1 10*9/L (ref 0.0–0.5)
EOSINOPHILS RELATIVE PERCENT: 1.4 %
HEMATOCRIT: 41.8 % (ref 39.0–48.0)
HEMOGLOBIN: 13.8 g/dL (ref 12.9–16.5)
LYMPHOCYTES ABSOLUTE COUNT: 1.6 10*9/L (ref 1.1–3.6)
LYMPHOCYTES RELATIVE PERCENT: 34.8 %
MEAN CORPUSCULAR HEMOGLOBIN CONC: 33 g/dL (ref 32.0–36.0)
MEAN CORPUSCULAR HEMOGLOBIN: 28.1 pg (ref 25.9–32.4)
MEAN CORPUSCULAR VOLUME: 85.1 fL (ref 77.6–95.7)
MEAN PLATELET VOLUME: 8.7 fL (ref 6.8–10.7)
MONOCYTES ABSOLUTE COUNT: 0.5 10*9/L (ref 0.3–0.8)
MONOCYTES RELATIVE PERCENT: 10.1 %
NEUTROPHILS ABSOLUTE COUNT: 2.4 10*9/L (ref 1.8–7.8)
NEUTROPHILS RELATIVE PERCENT: 53 %
PLATELET COUNT: 224 10*9/L (ref 150–450)
RED BLOOD CELL COUNT: 4.91 10*12/L (ref 4.26–5.60)
RED CELL DISTRIBUTION WIDTH: 15.5 % — ABNORMAL HIGH (ref 12.2–15.2)
WBC ADJUSTED: 4.5 10*9/L (ref 3.6–11.2)

## 2023-03-31 LAB — COMPREHENSIVE METABOLIC PANEL
ALBUMIN: 3.5 g/dL (ref 3.4–5.0)
ALKALINE PHOSPHATASE: 107 U/L (ref 46–116)
ALT (SGPT): 26 U/L (ref 10–49)
ANION GAP: 10 mmol/L (ref 5–14)
AST (SGOT): 29 U/L (ref <=34)
BILIRUBIN TOTAL: 0.5 mg/dL (ref 0.3–1.2)
BLOOD UREA NITROGEN: 22 mg/dL (ref 9–23)
BUN / CREAT RATIO: 12
CALCIUM: 9.6 mg/dL (ref 8.7–10.4)
CHLORIDE: 106 mmol/L (ref 98–107)
CO2: 22.8 mmol/L (ref 20.0–31.0)
CREATININE: 1.78 mg/dL — ABNORMAL HIGH (ref 0.73–1.18)
EGFR CKD-EPI (2021) MALE: 46 mL/min/{1.73_m2} — ABNORMAL LOW (ref >=60)
GLUCOSE RANDOM: 275 mg/dL — ABNORMAL HIGH (ref 70–179)
POTASSIUM: 5.8 mmol/L — ABNORMAL HIGH (ref 3.4–4.8)
PROTEIN TOTAL: 7.6 g/dL (ref 5.7–8.2)
SODIUM: 139 mmol/L (ref 135–145)

## 2023-03-31 LAB — MAGNESIUM: MAGNESIUM: 1.9 mg/dL (ref 1.6–2.6)

## 2023-03-31 LAB — PROTEIN / CREATININE RATIO, URINE
CREATININE, URINE: 110.8 mg/dL
PROTEIN URINE: 22.1 mg/dL
PROTEIN/CREAT RATIO, URINE: 0.199

## 2023-03-31 LAB — URINALYSIS WITH MICROSCOPY
BACTERIA: NONE SEEN /HPF
BILIRUBIN UA: NEGATIVE
BLOOD UA: NEGATIVE
GLUCOSE UA: 500 — AB
KETONES UA: NEGATIVE
LEUKOCYTE ESTERASE UA: NEGATIVE
NITRITE UA: NEGATIVE
PH UA: 5.5 (ref 5.0–9.0)
PROTEIN UA: NEGATIVE
RBC UA: <1 /HPF (ref <=3)
SPECIFIC GRAVITY UA: 1.013 (ref 1.003–1.030)
SQUAMOUS EPITHELIAL: <1 /HPF (ref 0–5)
UROBILINOGEN UA: <2
WBC UA: <1 /HPF (ref <=2)

## 2023-03-31 LAB — PHOSPHORUS: PHOSPHORUS: 3.5 mg/dL (ref 2.4–5.1)

## 2023-03-31 LAB — ALBUMIN / CREATININE URINE RATIO
ALBUMIN QUANT URINE: 2.8 mg/dL
ALBUMIN/CREATININE RATIO: 25.3 ug/mg (ref 0.0–30.0)
CREATININE, URINE: 110.8 mg/dL

## 2023-03-31 LAB — CMV DNA, QUANTITATIVE, PCR: CMV VIRAL LD: NOT DETECTED

## 2023-03-31 MED ORDER — SODIUM ZIRCONIUM CYCLOSILICATE 10 GRAM ORAL POWDER PACKET
PACK | Freq: Every day | ORAL | 2 refills | 30.00 days | Status: CP
Start: 2023-03-31 — End: 2023-06-29

## 2023-03-31 NOTE — Unmapped (Signed)
Reviewed K 5.8 with Dr. Elvera Maria - Recommended to start [facility] daily, repeat labs next week then may decrease to 3x weekly depending on level. Reviewed potassium-rich foods with patient and provided educational website for further information.

## 2023-04-01 LAB — TACROLIMUS LEVEL, TROUGH: TACROLIMUS, TROUGH: 5.8 ng/mL (ref 5.0–15.0)

## 2023-05-13 ENCOUNTER — Ambulatory Visit: Admit: 2023-05-13 | Discharge: 2023-05-14 | Payer: BLUE CROSS/BLUE SHIELD

## 2023-05-13 DIAGNOSIS — Z940 Kidney transplant status: Principal | ICD-10-CM

## 2023-05-13 LAB — CBC W/ AUTO DIFF
BASOPHILS ABSOLUTE COUNT: 0 10*9/L (ref 0.0–0.1)
BASOPHILS RELATIVE PERCENT: 0.8 %
EOSINOPHILS ABSOLUTE COUNT: 0.2 10*9/L (ref 0.0–0.5)
EOSINOPHILS RELATIVE PERCENT: 3.7 %
HEMATOCRIT: 45.6 % (ref 39.0–48.0)
HEMOGLOBIN: 14.9 g/dL (ref 12.9–16.5)
LYMPHOCYTES ABSOLUTE COUNT: 1.7 10*9/L (ref 1.1–3.6)
LYMPHOCYTES RELATIVE PERCENT: 36.5 %
MEAN CORPUSCULAR HEMOGLOBIN CONC: 32.8 g/dL (ref 32.0–36.0)
MEAN CORPUSCULAR HEMOGLOBIN: 27.9 pg (ref 25.9–32.4)
MEAN CORPUSCULAR VOLUME: 85 fL (ref 77.6–95.7)
MEAN PLATELET VOLUME: 8.7 fL (ref 6.8–10.7)
MONOCYTES ABSOLUTE COUNT: 0.4 10*9/L (ref 0.3–0.8)
MONOCYTES RELATIVE PERCENT: 8.7 %
NEUTROPHILS ABSOLUTE COUNT: 2.4 10*9/L (ref 1.8–7.8)
NEUTROPHILS RELATIVE PERCENT: 50.3 %
PLATELET COUNT: 232 10*9/L (ref 150–450)
RED BLOOD CELL COUNT: 5.36 10*12/L (ref 4.26–5.60)
RED CELL DISTRIBUTION WIDTH: 14.8 % (ref 12.2–15.2)
WBC ADJUSTED: 4.7 10*9/L (ref 3.6–11.2)

## 2023-05-13 LAB — COMPREHENSIVE METABOLIC PANEL
ALBUMIN: 3.8 g/dL (ref 3.4–5.0)
ALKALINE PHOSPHATASE: 116 U/L (ref 46–116)
ALT (SGPT): 38 U/L (ref 10–49)
ANION GAP: 11 mmol/L (ref 5–14)
AST (SGOT): 29 U/L (ref <=34)
BILIRUBIN TOTAL: 0.7 mg/dL (ref 0.3–1.2)
BLOOD UREA NITROGEN: 19 mg/dL (ref 9–23)
BUN / CREAT RATIO: 14
CALCIUM: 10.2 mg/dL (ref 8.7–10.4)
CHLORIDE: 105 mmol/L (ref 98–107)
CO2: 24.2 mmol/L (ref 20.0–31.0)
CREATININE: 1.38 mg/dL — ABNORMAL HIGH (ref 0.73–1.18)
EGFR CKD-EPI (2021) MALE: 63 mL/min/{1.73_m2} (ref >=60)
GLUCOSE RANDOM: 143 mg/dL — ABNORMAL HIGH (ref 70–99)
POTASSIUM: 4.1 mmol/L (ref 3.4–4.8)
PROTEIN TOTAL: 8.4 g/dL — ABNORMAL HIGH (ref 5.7–8.2)
SODIUM: 140 mmol/L (ref 135–145)

## 2023-05-13 LAB — URINALYSIS WITH MICROSCOPY
BACTERIA: NONE SEEN /HPF
BILIRUBIN UA: NEGATIVE
BLOOD UA: NEGATIVE
GLUCOSE UA: 300 — AB
KETONES UA: NEGATIVE
LEUKOCYTE ESTERASE UA: NEGATIVE
NITRITE UA: NEGATIVE
PH UA: 6.5 (ref 5.0–9.0)
RBC UA: 1 /HPF (ref <=3)
SPECIFIC GRAVITY UA: 1.01 (ref 1.003–1.030)
SQUAMOUS EPITHELIAL: <1 /HPF (ref 0–5)
UROBILINOGEN UA: <2
WBC UA: <1 /HPF (ref <=2)

## 2023-05-13 LAB — PROTEIN / CREATININE RATIO, URINE
CREATININE, URINE: 64.5 mg/dL
PROTEIN URINE: 26 mg/dL
PROTEIN/CREAT RATIO, URINE: 0.403

## 2023-05-13 LAB — TACROLIMUS LEVEL, TROUGH: TACROLIMUS, TROUGH: 6.4 ng/mL (ref 5.0–15.0)

## 2023-05-13 LAB — MAGNESIUM: MAGNESIUM: 1.6 mg/dL (ref 1.6–2.6)

## 2023-05-13 LAB — ALBUMIN / CREATININE URINE RATIO
ALBUMIN QUANT URINE: 7.1 mg/dL
ALBUMIN/CREATININE RATIO: 110.1 ug/mg — ABNORMAL HIGH (ref 0.0–30.0)
CREATININE, URINE: 64.5 mg/dL

## 2023-05-13 LAB — CMV DNA, QUANTITATIVE, PCR: CMV VIRAL LD: NOT DETECTED

## 2023-05-13 LAB — PHOSPHORUS: PHOSPHORUS: 2.7 mg/dL (ref 2.4–5.1)

## 2023-06-03 ENCOUNTER — Ambulatory Visit: Admit: 2023-06-03 | Discharge: 2023-06-04

## 2023-06-03 DIAGNOSIS — Z940 Kidney transplant status: Principal | ICD-10-CM

## 2023-06-03 LAB — CBC W/ AUTO DIFF
BASOPHILS ABSOLUTE COUNT: 0 10*9/L (ref 0.0–0.1)
BASOPHILS RELATIVE PERCENT: 0.7 %
EOSINOPHILS ABSOLUTE COUNT: 0.1 10*9/L (ref 0.0–0.5)
EOSINOPHILS RELATIVE PERCENT: 2 %
HEMATOCRIT: 43.5 % (ref 39.0–48.0)
HEMOGLOBIN: 14.8 g/dL (ref 12.9–16.5)
LYMPHOCYTES ABSOLUTE COUNT: 2 10*9/L (ref 1.1–3.6)
LYMPHOCYTES RELATIVE PERCENT: 40.2 %
MEAN CORPUSCULAR HEMOGLOBIN CONC: 33.9 g/dL (ref 32.0–36.0)
MEAN CORPUSCULAR HEMOGLOBIN: 28 pg (ref 25.9–32.4)
MEAN CORPUSCULAR VOLUME: 82.7 fL (ref 77.6–95.7)
MEAN PLATELET VOLUME: 9.2 fL (ref 6.8–10.7)
MONOCYTES ABSOLUTE COUNT: 0.4 10*9/L (ref 0.3–0.8)
MONOCYTES RELATIVE PERCENT: 8.7 %
NEUTROPHILS ABSOLUTE COUNT: 2.4 10*9/L (ref 1.8–7.8)
NEUTROPHILS RELATIVE PERCENT: 48.4 %
PLATELET COUNT: 227 10*9/L (ref 150–450)
RED BLOOD CELL COUNT: 5.27 10*12/L (ref 4.26–5.60)
RED CELL DISTRIBUTION WIDTH: 15.3 % — ABNORMAL HIGH (ref 12.2–15.2)
WBC ADJUSTED: 4.9 10*9/L (ref 3.6–11.2)

## 2023-06-03 LAB — COMPREHENSIVE METABOLIC PANEL
ALBUMIN: 3.8 g/dL (ref 3.4–5.0)
ALKALINE PHOSPHATASE: 124 U/L — ABNORMAL HIGH (ref 46–116)
ALT (SGPT): 32 U/L (ref 10–49)
ANION GAP: 13 mmol/L (ref 5–14)
AST (SGOT): 31 U/L (ref <=34)
BILIRUBIN TOTAL: 0.6 mg/dL (ref 0.3–1.2)
BLOOD UREA NITROGEN: 19 mg/dL (ref 9–23)
BUN / CREAT RATIO: 13
CALCIUM: 10.2 mg/dL (ref 8.7–10.4)
CHLORIDE: 103 mmol/L (ref 98–107)
CO2: 22.5 mmol/L (ref 20.0–31.0)
CREATININE: 1.46 mg/dL — ABNORMAL HIGH (ref 0.73–1.18)
EGFR CKD-EPI (2021) MALE: 59 mL/min/1.73m2 — ABNORMAL LOW (ref >=60)
GLUCOSE RANDOM: 184 mg/dL — ABNORMAL HIGH (ref 70–99)
POTASSIUM: 4.4 mmol/L (ref 3.4–4.8)
PROTEIN TOTAL: 8.3 g/dL — ABNORMAL HIGH (ref 5.7–8.2)
SODIUM: 138 mmol/L (ref 135–145)

## 2023-06-03 LAB — PROTEIN / CREATININE RATIO, URINE
CREATININE, URINE: 72.4 mg/dL
PROTEIN URINE: 41 mg/dL
PROTEIN/CREAT RATIO, URINE: 0.566

## 2023-06-03 LAB — URINALYSIS WITH MICROSCOPY
BACTERIA: NONE SEEN /HPF
BILIRUBIN UA: NEGATIVE
GLUCOSE UA: 200 — AB
KETONES UA: NEGATIVE
LEUKOCYTE ESTERASE UA: NEGATIVE
NITRITE UA: NEGATIVE
PH UA: 6 (ref 5.0–9.0)
RBC UA: 1 /HPF (ref <=3)
SPECIFIC GRAVITY UA: 1.01 (ref 1.003–1.030)
SQUAMOUS EPITHELIAL: <1 /HPF (ref 0–5)
UROBILINOGEN UA: <2
WBC UA: <1 /HPF (ref <=2)

## 2023-06-03 LAB — ALBUMIN / CREATININE URINE RATIO
ALBUMIN QUANT URINE: 17.1 mg/dL
ALBUMIN/CREATININE RATIO: 236.2 ug/mg — ABNORMAL HIGH (ref 0.0–30.0)
CREATININE, URINE: 72.4 mg/dL

## 2023-06-03 LAB — PHOSPHORUS: PHOSPHORUS: 1.9 mg/dL — ABNORMAL LOW (ref 2.4–5.1)

## 2023-06-03 LAB — MAGNESIUM: MAGNESIUM: 1.7 mg/dL (ref 1.6–2.6)

## 2023-06-04 LAB — CMV DNA, QUANTITATIVE, PCR: CMV VIRAL LD: NOT DETECTED

## 2023-06-04 LAB — TACROLIMUS LEVEL, TROUGH: TACROLIMUS, TROUGH: 6.4 ng/mL (ref 5.0–15.0)

## 2023-06-16 NOTE — Unmapped (Signed)
 The [facility] has made a second and final attempt to reach this patient to refill the following medication:mycophenolate  180 MG EC tablet (MYFORTIC ) and tacrolimus  1 MG capsule (PROGRAF ).      We have left voicemails on the following phone numbers: [phone], have sent a MyChart message, and have sent a text message to the following phone numbers: [phone] .    Dates contacted: 06/11/2023 - 06/16/2023  Last scheduled delivery: 03/18/2023    The patient may be at risk of non-compliance with this medication. The patient should call the [facility] at [phone]  Option 4, then Option 4: Infectious Disease, Transplant to refill medication.    Arlester Bence   [facility]

## 2023-06-30 NOTE — Unmapped (Signed)
 [facility] Refill Coordination Note    Specialty Medication(s) to be Shipped:   Transplant: mycophenolate mofetil 180mg  and tacrolimus 1mg     Other medication(s) to be shipped: amlodipine     Patrick Vazquez, DOB: 1975/04/09  Phone: [phone] (home) [phone] (work)      All above HIPAA information was verified with patient.     Was a Nurse, learning disability used for this call? No    Completed refill call assessment today to schedule patient's medication shipment from the [facility]  [phone]).  All relevant notes have been reviewed.     Specialty medication(s) and dose(s) confirmed: Regimen is correct and unchanged.   Changes to medications: Jaivian reports no changes at this time.  Changes to insurance: No  New side effects reported not previously addressed with a pharmacist or physician: None reported  Questions for the pharmacist: No    Confirmed patient received a Conservation officer, historic buildings and a Surveyor, mining with first shipment. The patient will receive a drug information handout for each medication shipped and additional FDA Medication Guides as required.       DISEASE/MEDICATION-SPECIFIC INFORMATION        N/A    SPECIALTY MEDICATION ADHERENCE     Medication Adherence    Patient reported X missed doses in the last month: 0  Specialty Medication: mycophenolate 180 MG EC tablet (MYFORTIC)  Patient is on additional specialty medications: Yes  Additional Specialty Medications: tacrolimus 1 MG capsule (PROGRAF)  Patient Reported Additional Medication X Missed Doses in the Last Month: 0  Patient is on more than two specialty medications: No  Any gaps in refill history greater than 2 weeks in the last 3 months: no  Demonstrates understanding of importance of adherence: yes  Informant: patient  Confirmed plan for next specialty medication refill: delivery by pharmacy  Refills needed for supportive medications: not needed          Refill Coordination    Has the Patients' Contact Information Changed: No  Is the Shipping Address Different: No         Were doses missed due to medication being on hold? No    tacrolimus 1  mg: 7 days of medicine on hand   mycophenolate 180  mg: 7 days of medicine on hand       REFERRAL TO PHARMACIST     Referral to the pharmacist: Not needed      [facility]     Shipping address confirmed in Epic.     Cost and Payment: Patient has a copay of $12. They are aware and have authorized the pharmacy to charge the credit card on file.    Delivery Scheduled: Yes, Expected medication delivery date: 07/02/23.     Medication will be delivered via UPS to the prescription address in Epic WAM.    Loretta Romp   [facility]  Specialty Technician

## 2023-07-01 MED FILL — MYCOPHENOLATE SODIUM 180 MG TABLET,DELAYED RELEASE: ORAL | 90 days supply | Qty: 360 | Fill #3

## 2023-07-01 MED FILL — AMLODIPINE 10 MG TABLET: ORAL | 90 days supply | Qty: 90 | Fill #2

## 2023-07-01 MED FILL — TACROLIMUS 1 MG CAPSULE, IMMEDIATE-RELEASE: ORAL | 90 days supply | Qty: 540 | Fill #1

## 2023-07-16 ENCOUNTER — Ambulatory Visit: Admit: 2023-07-16 | Payer: Medicaid (Managed Care)

## 2023-07-16 DIAGNOSIS — Z940 Kidney transplant status: Principal | ICD-10-CM

## 2023-07-22 DIAGNOSIS — Z940 Kidney transplant status: Principal | ICD-10-CM

## 2023-07-23 ENCOUNTER — Encounter: Payer: Self-pay | Admitting: Physical Therapy

## 2023-07-23 ENCOUNTER — Ambulatory Visit: Attending: Nurse Practitioner | Admitting: Physical Therapy

## 2023-07-23 DIAGNOSIS — I69352 Hemiplegia and hemiparesis following cerebral infarction affecting left dominant side: Secondary | ICD-10-CM | POA: Diagnosis present

## 2023-07-23 DIAGNOSIS — R2689 Other abnormalities of gait and mobility: Secondary | ICD-10-CM | POA: Diagnosis present

## 2023-07-23 DIAGNOSIS — R2681 Unsteadiness on feet: Secondary | ICD-10-CM | POA: Diagnosis present

## 2023-07-23 NOTE — Therapy (Unsigned)
 OUTPATIENT PHYSICAL THERAPY NEURO EVALUATION   Patient Name: [name] MRN: [id number] DOB:02-12-1976, 48 y.o., male Today's Date: 07/24/2023   PCP: Cristobal Donning., PA-C REFERRING PROVIDER: Ayesha Lente, FNP  END OF SESSION:  PT End of Session - 07/24/23 4098     Visit Number 1    Number of Visits 5   eval + 4 visits   Authorization Type [place] Medicaid [employer]    PT Start Time 1402    PT Stop Time 1446    PT Time Calculation (min) 44 min    Activity Tolerance Patient tolerated treatment well    Behavior During Therapy WFL for tasks assessed/performed             Past Medical History:  Diagnosis Date   Chronic kidney disease    GERD (gastroesophageal reflux disease)    Hypertension    Type 1 diabetes (HCC)    Past Surgical History:  Procedure Laterality Date   AV FISTULA PLACEMENT Left 07/28/2014   Procedure: ARTERIOVENOUS (AV) FISTULA CREATION;  Surgeon: Jackquelyn Mass, MD;  Location: [facility] ORS;  Service: Vascular;  Laterality: Left;   ESOPHAGOGASTRODUODENOSCOPY N/A 04/08/2013   Procedure: ESOPHAGOGASTRODUODENOSCOPY (EGD);  Surgeon: Alyce Jubilee, MD;  Location: [facility] SU[facility];  Service: Endoscopy;  Laterality: N/A;  8:30   INSERTION OF DIALYSIS CATHETER  11/2012   PERIPHERAL VASCULAR CATHETERIZATION N/A 10/23/2014   Procedure: Dialysis/Perma Catheter Removal;  Surgeon: Celso College, MD;  Location: [facility] INVASIVE CV LAB;  Service: Cardiovascular;  Laterality: N/A;   Patient Active Problem List   Diagnosis Date Noted   Aftercare following organ transplant 05/14/2016   Chronic pain syndrome 02/28/2016   Diabetic ketoacidosis associated with type 1 diabetes mellitus (HCC) 02/28/2016   Hypertension 02/28/2016   Kidney replaced by transplant 01/28/2016   ESRD on hemodialysis (HCC) 06/01/2015   Uncontrolled insulin dependent type 1 diabetes mellitus 06/01/2015   Diastolic dysfunction, left ventricle 11/15/2014   Former smoker 11/15/2014   Gastritis  11/15/2014   Gastroesophageal reflux disease with esophagitis 11/15/2014   Hypertension secondary to other renal disorders (CODE) 11/15/2014   Type 1 diabetes mellitus (HCC) 11/15/2014   Dyspepsia 03/16/2013    ONSET DATE:  Referral date   REFERRING DIAGNOSIS:  Z86.73 (ICD-10-CM) - Personal history of transient ischemic attack (TIA), and cerebral infarction without residual deficits  THERAPY DIAG:  Hemiplegia and hemiparesis following cerebral infarction affecting left dominant side (HCC)  Other abnormalities of gait and mobility  Unsteadiness on feet  Rationale for Evaluation and Treatment: Rehabilitation  SUBJECTIVE:  SUBJECTIVE STATEMENT: Pt reports he almost fell going up steps at wrestling event at [facility] on 07-10-23 (due to imbalance): pt states he had a major stroke in 2015 and received HH PT but never received OP PT services.  Pt then had kidney transplant in 2017 and says everything was focused on this recovery and not so much on his Lt hemiparesis.  Pt reports he recently started dating someone and she questioned why is gait is different, and not normal.  Pt reports [place] when walking up steps to get to his seat at the wrestling event and then decided he needed to pursue therapy and try to determine what is going on with him.   Pt accompanied by: self  PERTINENT HISTORY: kidney transplant Dec. 2017; h/o CVA 2015 with Lt hemiparesis, type 1 DM   PAIN:  Are you having pain? No  PRECAUTIONS: None  RED FLAGS: None   WEIGHT BEARING RESTRICTIONS: No  FALLS: Has patient fallen in last 6 months? No  LIVING ENVIRONMENT: Lives with: lives alone Lives in: House/apartment - lives on 2nd level - in an apartment Stairs: Yes: External: 12 steps; can  reach both; pt reports he has more difficulty going down steps than going up Has following equipment at home: None  PLOF: Independent  PATIENT GOALS: increase strength Lt leg and improve balance  OBJECTIVE:  Note: Objective measures were completed at Evaluation unless otherwise noted.  DIAGNOSTIC FINDINGS: N/A  COGNITION: Overall cognitive status: Within functional limits for tasks assessed   SENSATION: WFL  COORDINATION: WNL's bil. LE's  POSTURE: No Significant postural limitations  LOWER EXTREMITY ROM:  WNL's bil. LE's   LOWER EXTREMITY MMT:    MMT Right Eval Left Eval  Hip flexion 5 4  Hip extension 4+ 4-  Hip abduction    Hip adduction    Hip internal rotation    Hip external rotation    Knee flexion  4-  Knee extension 5   Ankle dorsiflexion 5 4  Ankle plantarflexion 5 4-  Ankle inversion    Ankle eversion    (Blank rows = not tested)  BED MOBILITY:  Findings: Independent  TRANSFERS: Sit to stand: Complete Independence  Assistive device utilized: None     Able to stand from mat table without UE support   STAIRS: Not tested GAIT: Findings: Gait Characteristics: step through pattern, Distance walked: 150', Assistive device utilized:None, Level of assistance: Complete Independence, and Comments: decreased push off LLE due to Lt plantarflexor weakness  FUNCTIONAL TESTS:  Timed up and go (TUG): NT 10 meter walk test: 11.74 secs= 2.79 ft/sec without device MCTSIB: Condition 1: Avg of 3 trials: 30 sec, Condition 2: Avg of 3 trials: 30 sec, Condition 3: Avg of 3 trials: 30 sec, Condition 4: Avg of 3 trials: 14.94 sec, and Total Score: 105/120 SLS = RLE = 6.3 secs:  LLE= 7.62 secs (3rd trial)  TREATMENT DATE: 07-23-23 Medbridge HEP:    Access Code: [facility] URL: [email or web address] Date: 07/24/2023 Prepared by:  Johnnette Nakayama  Exercises - Single Leg Heel Raise  - 1 x daily - 7 x weekly - 3 sets - 10 reps - 3-4 sec hold - Supine Hip Flexion with Resistance  - 1 x daily - 7 x weekly - 3 sets - 10 reps - Seated Hamstring Curl with Anchored Resistance  - 1 x daily - 7 x weekly - 3 sets - 10 reps - Sidelying Hip Abduction and Extension with Loop Band  - 1 x daily - 7 x weekly - 3 sets - 10 reps - Romberg Stance Eyes Closed on Foam Pad  - 1 x daily - 7 x weekly - 1 sets - 1-2 reps - 30 sec  hold - Single Leg Stance with Support  - 1 x daily - 7 x weekly - 1-2 sets - 2 reps - 10  sec hold   Pt education:  HEP, POC, and eval results Person educated: Patient Education method: Explanation, Demonstration, and Handouts Education comprehension: verbalized understanding, returned demonstration, and needs further education  HOME EXERCISE PROGRAM: See above  GOALS: Goals reviewed with patient? Yes  SHORT TERM GOALS: same as LTG's as ELOS = 4 weeks    LONG TERM GOALS: Target date: 08-24-23  Pt will increase gait velocity to >/= 3.2 ft/sec without device for increased gait efficiency. Baseline: 2.79 ft/sec without use of assistive device Goal status: INITIAL  2.  Pt will subjectively report at least 25% improvement of left leg feeling "heavy" after amb. 30" for walking program. Baseline: Pt reports LLE feels very heavy and impacts gait pattern after ambulating 30" - results in decreased gait speed Goal status: INITIAL  3.  Pt will demonstrate improved balance and increased vestibular input by standing for 30 secs on compliant surface with feet together with minimal postural sway. Baseline: 14.94 secs with moderate postural sway Goal status: INITIAL  4.  Demonstrate improved balance by performing SLS on LLE for at least 10 secs for increased safety with step negotiation and stepping over objects.  Baseline: 7.62 secs on LLE on 3rd trial Goal status: INITIAL  5.  Independent in HEP for LLE strengthening  and balance/vestibular exercises. Baseline: Dependent Goal status: INITIAL   ASSESSMENT:  CLINICAL IMPRESSION: Patient is a 48 y.o. gentleman who was seen today for physical therapy evaluation and treatment for mild LLE weakness and decreased high level balance skills due to h/o Rt CVA.  Pt presents with mild gait deviations including decreased push off LLE due to Lt plantarflexor weakness and due to decreased strength in Lt hip musc. and Lt hamstring weakness.  Pt also demonstrates decreased balance with decreased SLS on each leg and decreased vestibular input in maintaining balance as evidenced by inability to stand with EC on compliant surface for 30 secs in Romberg position.  Pt will benefit from skilled PT to address LLE weakness and gait and balance deficits.    OBJECTIVE IMPAIRMENTS: Abnormal gait, decreased balance, and decreased strength.   ACTIVITY LIMITATIONS: stairs and locomotion level  PARTICIPATION LIMITATIONS: community activity and yard work  PERSONAL FACTORS: Past/current experiences, Time since onset of injury/illness/exacerbation, and 1 comorbidity: h/o CVA I 2015 are also affecting patient's functional outcome.   REHAB POTENTIAL: Excellent  CLINICAL DECISION MAKING: Evolving/moderate complexity  EVALUATION COMPLEXITY: Moderate  PLAN:  PT FREQUENCY: 1x/week  PT DURATION: 4 weeks+ eval  PLANNED INTERVENTIONS: 97110-Therapeutic exercises, 97530- Therapeutic activity,  16109- Neuromuscular re-education, [phone]- Self Care, and 09811- Gait training  PLAN FOR NEXT SESSION: check HEP - add exercises with use of green theraband; continue LLE strengthening   Arionna Hoggard, Celeste Cola, PT 07/24/2023, 11:35 AM   Check all possible CPT codes: 97110- Therapeutic Exercise, [phone]- Neuro Re-education, [phone] - Gait Training, [phone] - Therapeutic Activities, and 57846 - Self Care    Check all conditions that are expected to impact treatment: Diabetes mellitus and Neurological  condition and/or seizures   If treatment provided at initial evaluation, no treatment charged due to lack of authorization.

## 2023-07-30 ENCOUNTER — Ambulatory Visit: Payer: Self-pay | Attending: Nurse Practitioner | Admitting: Physical Therapy

## 2023-07-30 DIAGNOSIS — R2681 Unsteadiness on feet: Secondary | ICD-10-CM | POA: Diagnosis present

## 2023-07-30 DIAGNOSIS — I69352 Hemiplegia and hemiparesis following cerebral infarction affecting left dominant side: Secondary | ICD-10-CM | POA: Diagnosis present

## 2023-07-30 DIAGNOSIS — R2689 Other abnormalities of gait and mobility: Secondary | ICD-10-CM | POA: Insufficient documentation

## 2023-07-30 NOTE — Therapy (Unsigned)
 OUTPATIENT PHYSICAL THERAPY NEURO TREATMENT NOTE   Patient Name: [name] MRN: [id number] DOB:1975/09/22, 48 y.o., male Today's Date: 07/31/2023   PCP: Cristobal Donning., PA-C REFERRING PROVIDER: Ayesha Lente, FNP  END OF SESSION:  PT End of Session - 07/31/23 1954     Visit Number 2    Number of Visits 5   eval + 4 visits   Authorization Type [place] Medicaid Healthy Blue    PT Start Time 1536    PT Stop Time 1623    PT Time Calculation (min) 47 min    Activity Tolerance Patient tolerated treatment well    Behavior During Therapy WFL for tasks assessed/performed              Past Medical History:  Diagnosis Date   Chronic kidney disease    GERD (gastroesophageal reflux disease)    Hypertension    Type 1 diabetes (HCC)    Past Surgical History:  Procedure Laterality Date   AV FISTULA PLACEMENT Left 07/28/2014   Procedure: ARTERIOVENOUS (AV) FISTULA CREATION;  Surgeon: Jackquelyn Mass, MD;  Location: [facility];  Service: Vascular;  Laterality: Left;   ESOPHAGOGASTRODUODENOSCOPY N/A 04/08/2013   Procedure: ESOPHAGOGASTRODUODENOSCOPY (EGD);  Surgeon: Alyce Jubilee, MD;  [facility];  Service: Endoscopy;  Laterality: N/A;  8:30   INSERTION OF DIALYSIS CATHETER  11/2012   PERIPHERAL VASCULAR CATHETERIZATION N/A 10/23/2014   Procedure: Dialysis/Perma Catheter Removal;  Surgeon: Celso College, MD;  Location: [facility];  Service: Cardiovascular;  Laterality: N/A;   Patient Active Problem List   Diagnosis Date Noted   Aftercare following organ transplant 05/14/2016   Chronic pain syndrome 02/28/2016   Diabetic ketoacidosis associated with type 1 diabetes mellitus (HCC) 02/28/2016   Hypertension 02/28/2016   Kidney replaced by transplant 01/28/2016   ESRD on hemodialysis (HCC) 06/01/2015   Uncontrolled insulin dependent type 1 diabetes mellitus 06/01/2015   Diastolic dysfunction, left ventricle 11/15/2014   Former smoker 11/15/2014    Gastritis 11/15/2014   Gastroesophageal reflux disease with esophagitis 11/15/2014   Hypertension secondary to other renal disorders (CODE) 11/15/2014   Type 1 diabetes mellitus (HCC) 11/15/2014   Dyspepsia 03/16/2013    ONSET DATE:  Referral date   REFERRING DIAGNOSIS:  Z86.73 (ICD-10-CM) - Personal history of transient ischemic attack (TIA), and cerebral infarction without residual deficits  THERAPY DIAG:  Other abnormalities of gait and mobility  Unsteadiness on feet  Hemiplegia and hemiparesis following cerebral infarction affecting left dominant side (HCC)  Rationale for Evaluation and Treatment: Rehabilitation  SUBJECTIVE:  SUBJECTIVE STATEMENT: Pt reports he has been trying to think about putting his left heel down first when he walks to have a more normal gait pattern.  Pt requests that his leg length be measured - says his girlfriend thinks he has one leg longer than the other. Says HEP is going well Pt accompanied by: self  PERTINENT HISTORY: kidney transplant Dec. 2017; h/o CVA 2015 with Lt hemiparesis, type 1 DM   PAIN:  Are you having pain? No  PRECAUTIONS: None  RED FLAGS: None   WEIGHT BEARING RESTRICTIONS: No  FALLS: Has patient fallen in last 6 months? No  LIVING ENVIRONMENT: Lives with: lives alone Lives in: House/apartment - lives on 2nd level - in an apartment Stairs: Yes: External: 12 steps; can reach both; pt reports he has more difficulty going down steps than going up Has following equipment at home: None  PLOF: Independent  PATIENT GOALS: increase strength Lt leg and improve balance  OBJECTIVE:  Note: Objective measures were completed at Evaluation unless otherwise noted.  DIAGNOSTIC FINDINGS: N/A  COGNITION: Overall cognitive status: Within functional  limits for tasks assessed   SENSATION: WFL  COORDINATION: WNL's bil. LE's  POSTURE: No Significant postural limitations  LOWER EXTREMITY ROM:  WNL's bil. LE's   LOWER EXTREMITY MMT:    MMT Right Eval Left Eval  Hip flexion 5 4  Hip extension 4+ 4-  Hip abduction    Hip adduction    Hip internal rotation    Hip external rotation    Knee flexion  4-  Knee extension 5   Ankle dorsiflexion 5 4  Ankle plantarflexion 5 4-  Ankle inversion    Ankle eversion    (Blank rows = not tested)  BED MOBILITY:  Findings: Independent  TRANSFERS: Sit to stand: Complete Independence  Assistive device utilized: None     Able to stand from mat table without UE support   STAIRS: Not tested GAIT: Findings: Gait Characteristics: step through pattern, Distance walked: 150', Assistive device utilized:None, Level of assistance: Complete Independence, and Comments: decreased push off LLE due to Lt plantarflexor weakness  FUNCTIONAL TESTS:  Timed up and go (TUG): NT 10 meter walk test: 11.74 secs= 2.79 ft/sec without device MCTSIB: Condition 1: Avg of 3 trials: 30 sec, Condition 2: Avg of 3 trials: 30 sec, Condition 3: Avg of 3 trials: 30 sec, Condition 4: Avg of 3 trials: 14.94 sec, and Total Score: 105/120 SLS = RLE = 6.3 secs:  LLE= 7.62 secs (3rd trial)                                                                                                                               TREATMENT DATE: 07-30-23  Gait:  leg length measured per pt's request - RLE 38";  LLE 38.5" Heel wedge trialed in Rt shoe - pt reported he felt uneven with use of the heel wedge; peeled layer to reduce thickness - placed  in Rt shoe - no significant difference noted - pt informed that gait deviations appear to be secondary to LLE weakness and not true leg length discrepancy  Pt gait trained 115' x 1 rep without device - increased Lt heel contact at initial stance noted  NeuroRe-ed: Standing Balance: Surface:  Airex Position: Narrow Base of Support Feet Hip Width Apart Completed with: Eyes Open and Eyes Closed; Head Turns x 5 Reps and Head Nods x 5 Reps  Marching on foam - EO 10 reps:  EC 10 reps:  marching with EO with horizontal head turns 10 reps; marching with EC with horizontal head turns 10 reps  Alternate stepping down to floor with head turn to opposite side - 5 reps each side with CGA for assist with balance recovery  Amb. On tiptoes 2 laps along counter (10' x 4); amb. On heels 2 laps along counter (10' x 4) for balance and ankle strengthening  TherEx:   Pt performed following exercises for HEP for LLE strengthening; - green theraband used for resistance  Access Code: [email or web address] URL: [email or web address] Date: 07/31/2023 Prepared by: Johnnette Nakayama  Exercises - Seated Ankle Dorsiflexion with Resistance  - 1 x daily - 7 x weekly - 3 sets - 10 reps - 5 hold - Standing Repeated Hip Flexion with Resistance  - 1 x daily - 7 x weekly - 3 sets - 10 reps - Hip and Knee Flexion with Anchored Resistance  - 1 x daily - 7 x weekly - 3 sets - 10 reps - Standing Repeated Hip Extension with Resistance  - 1 x daily - 7 x weekly - 3 sets - 10 reps - Standing Hip Abduction with Theraband Resistance  - 1 x daily - 7 x weekly - 3 sets - 10 reps - Toe Walking  - 1 x daily - 7 x weekly - 1-2 sets - 10 reps - Heel Walking  - 1 x daily - 7 x weekly - 1-2 sets - 10 reps     Medbridge HEP:    Access Code: [facility] URL: [email or web address] Date: 07/24/2023 Prepared by: Johnnette Nakayama  Exercises - Single Leg Heel Raise  - 1 x daily - 7 x weekly - 3 sets - 10 reps - 3-4 sec hold - Supine Hip Flexion with Resistance  - 1 x daily - 7 x weekly - 3 sets - 10 reps - Seated Hamstring Curl with Anchored Resistance  - 1 x daily - 7 x weekly - 3 sets - 10 reps - Sidelying Hip Abduction and Extension with Loop Band  - 1 x daily - 7 x weekly - 3 sets - 10 reps - Romberg Stance Eyes  Closed on Foam Pad  - 1 x daily - 7 x weekly - 1 sets - 1-2 reps - 30 sec  hold - Single Leg Stance with Support  - 1 x daily - 7 x weekly - 1-2 sets - 2 reps - 10  sec hold   Pt education:  HEP, POC, and eval results Person educated: Patient Education method: Explanation, Demonstration, and Handouts Education comprehension: verbalized understanding, returned demonstration, and needs further education  HOME EXERCISE PROGRAM: See above  GOALS: Goals reviewed with patient? Yes  SHORT TERM GOALS: same as LTG's as ELOS = 4 weeks    LONG TERM GOALS: Target date: 08-24-23  Pt will increase gait velocity to >/= 3.2 ft/sec without device for increased gait efficiency. Baseline: 2.79 ft/sec without use of assistive device Goal status:  INITIAL  2.  Pt will subjectively report at least 25% improvement of left leg feeling "heavy" after amb. 30" for walking program. Baseline: Pt reports LLE feels very heavy and impacts gait pattern after ambulating 30" - results in decreased gait speed Goal status: INITIAL  3.  Pt will demonstrate improved balance and increased vestibular input by standing for 30 secs on compliant surface with feet together with minimal postural sway. Baseline: 14.94 secs with moderate postural sway Goal status: INITIAL  4.  Demonstrate improved balance by performing SLS on LLE for at least 10 secs for increased safety with step negotiation and stepping over objects.  Baseline: 7.62 secs on LLE on 3rd trial Goal status: INITIAL  5.  Independent in HEP for LLE strengthening and balance/vestibular exercises. Baseline: Dependent Goal status: INITIAL   ASSESSMENT:  CLINICAL IMPRESSION: PT session focused on LLE strengthening with use of green theraband given for PRE's for updated HEP with focus on Lt hip and dorsiflexor and plantarflexor strengthening.  Pt's gait pattern is improved with less deviations noted with pt demonstrating increased Lt initial heel contact in stance  phase of gait with less foot flat contact.  Pt requested leg length measurement, inquiring if he has a LLD.  Measurement revealed approx. 1/2" difference but difficult to accurately palpate ASIS due to pt's body habitus.  Informed pt that deviations appear to be secondary to LLE weakness and not a true LLD.  Pt verbalized understanding and agreement.  Cont with POC.     OBJECTIVE IMPAIRMENTS: Abnormal gait, decreased balance, and decreased strength.   ACTIVITY LIMITATIONS: stairs and locomotion level  PARTICIPATION LIMITATIONS: community activity and yard work  PERSONAL FACTORS: Past/current experiences, Time since onset of injury/illness/exacerbation, and 1 comorbidity: h/o CVA I 2015 are also affecting patient's functional outcome.   REHAB POTENTIAL: Excellent  CLINICAL DECISION MAKING: Evolving/moderate complexity  EVALUATION COMPLEXITY: Moderate  PLAN:  PT FREQUENCY: 1x/week  PT DURATION: 4 weeks+ eval  PLANNED INTERVENTIONS: 97110-Therapeutic exercises, 97530- Therapeutic activity, 97112- Neuromuscular re-education, 97535- Self Care, and 16109- Gait training  PLAN FOR NEXT SESSION: check HEP - add exercises with use of green theraband; continue LLE strengthening, vestibular/balance exercises; plantarflexion exercise off side of mat table   Prarthana Parlin, Celeste Cola, PT 07/31/2023, 7:56 PM   Check all possible CPT codes: 97110- Therapeutic Exercise, [id number]- Neuro Re-education, [phone] - Gait Training, [phone] - Therapeutic Activities, and 29562 - Self Care    Check all conditions that are expected to impact treatment: Diabetes mellitus and Neurological condition and/or seizures   If treatment provided at initial evaluation, no treatment charged due to lack of authorization.

## 2023-07-31 ENCOUNTER — Encounter: Payer: Self-pay | Admitting: Physical Therapy

## 2023-08-04 NOTE — Unmapped (Signed)
 Created in Error Patient isn't due for refills yet - RC date updated     Thank you,    Arlester Bence, CPhT  Specialty Pharmacy Technician  [facility]  (P): [phone]

## 2023-08-11 ENCOUNTER — Ambulatory Visit: Admit: 2023-08-11 | Discharge: 2023-08-12 | Payer: Medicaid (Managed Care)

## 2023-08-11 DIAGNOSIS — Z940 Kidney transplant status: Principal | ICD-10-CM

## 2023-08-11 LAB — URINALYSIS WITH MICROSCOPY
BACTERIA: NONE SEEN /HPF
BILIRUBIN UA: NEGATIVE
BLOOD UA: NEGATIVE
GLUCOSE UA: >1000 — AB
KETONES UA: NEGATIVE
LEUKOCYTE ESTERASE UA: NEGATIVE
NITRITE UA: NEGATIVE
PH UA: 5 (ref 5.0–9.0)
PROTEIN UA: NEGATIVE
RBC UA: 1 /HPF (ref <=3)
SPECIFIC GRAVITY UA: 1.018 (ref 1.003–1.030)
SQUAMOUS EPITHELIAL: <1 /HPF (ref 0–5)
UROBILINOGEN UA: <2
WBC UA: <1 /HPF (ref <=2)

## 2023-08-11 LAB — COMPREHENSIVE METABOLIC PANEL
ALBUMIN: 3.9 g/dL (ref 3.4–5.0)
ALKALINE PHOSPHATASE: 100 U/L (ref 46–116)
ALT (SGPT): 31 U/L (ref 10–49)
ANION GAP: 12 mmol/L (ref 5–14)
AST (SGOT): 31 U/L (ref <=34)
BILIRUBIN TOTAL: 0.7 mg/dL (ref 0.3–1.2)
BLOOD UREA NITROGEN: 30 mg/dL — ABNORMAL HIGH (ref 9–23)
BUN / CREAT RATIO: 18
CALCIUM: 9.7 mg/dL (ref 8.7–10.4)
CHLORIDE: 106 mmol/L (ref 98–107)
CO2: 17.3 mmol/L — ABNORMAL LOW (ref 20.0–31.0)
CREATININE: 1.69 mg/dL — ABNORMAL HIGH (ref 0.73–1.18)
EGFR CKD-EPI (2021) MALE: 49 mL/min/1.73m2 — ABNORMAL LOW (ref >=60)
GLUCOSE RANDOM: 372 mg/dL — ABNORMAL HIGH (ref 70–99)
POTASSIUM: 4.5 mmol/L (ref 3.4–4.8)
PROTEIN TOTAL: 8 g/dL (ref 5.7–8.2)
SODIUM: 135 mmol/L (ref 135–145)

## 2023-08-11 LAB — CBC W/ AUTO DIFF
BASOPHILS ABSOLUTE COUNT: 0 10*9/L (ref 0.0–0.1)
BASOPHILS RELATIVE PERCENT: 0.5 %
EOSINOPHILS ABSOLUTE COUNT: 0 10*9/L (ref 0.0–0.5)
EOSINOPHILS RELATIVE PERCENT: 0.9 %
HEMATOCRIT: 40.9 % (ref 39.0–48.0)
HEMOGLOBIN: 13.6 g/dL (ref 12.9–16.5)
LYMPHOCYTES ABSOLUTE COUNT: 1.3 10*9/L (ref 1.1–3.6)
LYMPHOCYTES RELATIVE PERCENT: 26.1 %
MEAN CORPUSCULAR HEMOGLOBIN CONC: 33.2 g/dL (ref 32.0–36.0)
MEAN CORPUSCULAR HEMOGLOBIN: 27.5 pg (ref 25.9–32.4)
MEAN CORPUSCULAR VOLUME: 82.8 fL (ref 77.6–95.7)
MEAN PLATELET VOLUME: 8.9 fL (ref 6.8–10.7)
MONOCYTES ABSOLUTE COUNT: 0.3 10*9/L (ref 0.3–0.8)
MONOCYTES RELATIVE PERCENT: 6 %
NEUTROPHILS ABSOLUTE COUNT: 3.4 10*9/L (ref 1.8–7.8)
NEUTROPHILS RELATIVE PERCENT: 66.5 %
PLATELET COUNT: 215 10*9/L (ref 150–450)
RED BLOOD CELL COUNT: 4.94 10*12/L (ref 4.26–5.60)
RED CELL DISTRIBUTION WIDTH: 14.8 % (ref 12.2–15.2)
WBC ADJUSTED: 5.1 10*9/L (ref 3.6–11.2)

## 2023-08-11 LAB — TACROLIMUS LEVEL: TACROLIMUS BLOOD: 5.7 ng/mL

## 2023-08-11 LAB — PROTEIN / CREATININE RATIO, URINE
CREATININE, URINE: 105.7 mg/dL
PROTEIN URINE: 19.6 mg/dL
PROTEIN/CREAT RATIO, URINE: 0.185

## 2023-08-11 LAB — MAGNESIUM: MAGNESIUM: 1.5 mg/dL — ABNORMAL LOW (ref 1.6–2.6)

## 2023-08-11 LAB — PHOSPHORUS: PHOSPHORUS: 1.9 mg/dL — ABNORMAL LOW (ref 2.4–5.1)

## 2023-08-11 LAB — ALBUMIN / CREATININE URINE RATIO
ALBUMIN QUANT URINE: 4.6 mg/dL
ALBUMIN/CREATININE RATIO: 43.5 ug/mg — ABNORMAL HIGH (ref 0.0–30.0)
CREATININE, URINE: 105.7 mg/dL

## 2023-08-11 NOTE — Unmapped (Signed)
 Reviewed labs with patient - Mg 1.5, currently taking mag ox 400 mg daily, will increase to BID. Phos 1.9 - will try to increase milk, cheese, yogurt, almonds. If unable to bring it up may suggest phos supplements. Patient agreeable to plan, no other needs at this time.

## 2023-08-18 ENCOUNTER — Ambulatory Visit: Payer: Self-pay | Admitting: Physical Therapy

## 2023-08-18 DIAGNOSIS — R2689 Other abnormalities of gait and mobility: Secondary | ICD-10-CM | POA: Diagnosis not present

## 2023-08-18 DIAGNOSIS — I69352 Hemiplegia and hemiparesis following cerebral infarction affecting left dominant side: Secondary | ICD-10-CM

## 2023-08-18 MED FILL — CARVEDILOL 25 MG TABLET: ORAL | 90 days supply | Qty: 180 | Fill #3

## 2023-08-18 MED FILL — OMEPRAZOLE 40 MG CAPSULE,DELAYED RELEASE: ORAL | 90 days supply | Qty: 90 | Fill #1

## 2023-08-18 NOTE — Therapy (Unsigned)
 OUTPATIENT PHYSICAL THERAPY NEURO TREATMENT NOTE   Patient Name: [name] MRN: [id number] DOB:1975/12/12, 48 y.o., male Today's Date: 08/20/2023   PCP: Casimir Curtistine DASEN., PA-C REFERRING PROVIDER: Margarete Maeola DASEN, FNP  END OF SESSION:  PT End of Session - 08/20/23 1922     Visit Number 3    Number of Visits 6   eval + 4 visits   Date for PT Re-Evaluation 09/04/23    Authorization Type [place] Medicaid Healthy Blue    Authorization Time Period 07-30-23 - 09-27-23    Authorization - Visit Number 2    Authorization - Number of Visits 5    PT Start Time 1535    PT Stop Time 1621    PT Time Calculation (min) 46 min    Activity Tolerance Patient tolerated treatment well    Behavior During Therapy WFL for tasks assessed/performed            Past Medical History:  Diagnosis Date   Chronic kidney disease    GERD (gastroesophageal reflux disease)    Hypertension    Type 1 diabetes (HCC)    Past Surgical History:  Procedure Laterality Date   AV FISTULA PLACEMENT Left 07/28/2014   Procedure: ARTERIOVENOUS (AV) FISTULA CREATION;  Surgeon: Cordella KANDICE Shawl, MD;  Location: [facility] OR[facility];  Service: Vascular;  Laterality: Left;   ESOPHAGOGASTRODUODENOSCOPY N/A 04/08/2013   Procedure: ESOPHAGOGASTRODUODENOSCOPY (EGD);  Surgeon: Margo LITTIE Haddock, MD;  [facility];  Service: Endoscopy;  Laterality: N/A;  8:30   INSERTION OF DIALYSIS CATHETER  11/2012   PERIPHERAL VASCULAR CATHETERIZATION N/A 10/23/2014   Procedure: Dialysis/Perma Catheter Removal;  Surgeon: Selinda GORMAN Gu, MD;  Location: [facility];  Service: Cardiovascular;  Laterality: N/A;   Patient Active Problem List   Diagnosis Date Noted   Aftercare following organ transplant 05/14/2016   Chronic pain syndrome 02/28/2016   Diabetic ketoacidosis associated with type 1 diabetes mellitus (HCC) 02/28/2016   Hypertension 02/28/2016   Kidney replaced by transplant 01/28/2016   ESRD on hemodialysis (HCC) 06/01/2015    Uncontrolled insulin dependent type 1 diabetes mellitus 06/01/2015   Diastolic dysfunction, left ventricle 11/15/2014   Former smoker 11/15/2014   Gastritis 11/15/2014   Gastroesophageal reflux disease with esophagitis 11/15/2014   Hypertension secondary to other renal disorders (CODE) 11/15/2014   Type 1 diabetes mellitus (HCC) 11/15/2014   Dyspepsia 03/16/2013    ONSET DATE:  Referral date   REFERRING DIAGNOSIS:  Z86.73 (ICD-10-CM) - Personal history of transient ischemic attack (TIA), and cerebral infarction without residual deficits  THERAPY DIAG:  Other abnormalities of gait and mobility  Hemiplegia and hemiparesis following cerebral infarction affecting left dominant side (HCC)  Rationale for Evaluation and Treatment: Rehabilitation  SUBJECTIVE:  SUBJECTIVE STATEMENT: Pt reports he continues to do exercises at home; has thought about ordering some ankle weights but hasn't done it yet  Pt accompanied by: self  PERTINENT HISTORY: kidney transplant Dec. 2017; h/o CVA 2015 with Lt hemiparesis, type 1 DM   PAIN:  Are you having pain? No  PRECAUTIONS: None  RED FLAGS: None   WEIGHT BEARING RESTRICTIONS: No  FALLS: Has patient fallen in last 6 months? No  LIVING ENVIRONMENT: Lives with: lives alone Lives in: House/apartment - lives on 2nd level - in an apartment Stairs: Yes: External: 12 steps; can reach both; pt reports he has more difficulty going down steps than going up Has following equipment at home: None  PLOF: Independent  PATIENT GOALS: increase strength Lt leg and improve balance  OBJECTIVE:  Note: Objective measures were completed at Evaluation unless otherwise noted.  DIAGNOSTIC FINDINGS: N/A  COGNITION: Overall cognitive status: Within functional limits for  tasks assessed   SENSATION: WFL  COORDINATION: WNL's bil. LE's  POSTURE: No Significant postural limitations  LOWER EXTREMITY ROM:  WNL's bil. LE's   LOWER EXTREMITY MMT:    MMT Right Eval Left Eval  Hip flexion 5 4  Hip extension 4+ 4-  Hip abduction    Hip adduction    Hip internal rotation    Hip external rotation    Knee flexion  4-  Knee extension 5   Ankle dorsiflexion 5 4  Ankle plantarflexion 5 4-  Ankle inversion    Ankle eversion    (Blank rows = not tested)  BED MOBILITY:  Findings: Independent  TRANSFERS: Sit to stand: Complete Independence  Assistive device utilized: None     Able to stand from mat table without UE support   STAIRS: Not tested GAIT: Findings: Gait Characteristics: step through pattern, Distance walked: 150', Assistive device utilized:None, Level of assistance: Complete Independence, and Comments: decreased push off LLE due to Lt plantarflexor weakness  FUNCTIONAL TESTS:  Timed up and go (TUG): NT 10 meter walk test: 11.74 secs= 2.79 ft/sec without device MCTSIB: Condition 1: Avg of 3 trials: 30 sec, Condition 2: Avg of 3 trials: 30 sec, Condition 3: Avg of 3 trials: 30 sec, Condition 4: Avg of 3 trials: 14.94 sec, and Total Score: 105/120 SLS = RLE = 6.3 secs:  LLE= 7.62 secs (3rd trial)                                                                                                                               TREATMENT DATE: 08-18-23  TherEx:   Pt performed following exercises for HEP for LLE strengthening; - 3#/4# weights used for resistance  Lt SLR 3#10 REPS: 4# 10 REPS Lt HIP FLEXION 4# 10 REPS IN HOOKLYING  Lt HIP EXTENSION CONTROL 4# 10 REPS - lowering leg off side of mat Lt 1/2 BRIDGE 10 REPS BRIDGE WITH RLE EXTENSION BRIDGE WITH MARCHING HIP ABDUCTION with 3# weight 10 REPS; moving  LLE FORWARD/BACK 10 REPS with 3# weight in abducted position LLE CLAM SHELL 4# 10 REPS; 5# 10 REPS Lt KNEE FLEXION in prone position -  3# 10 REPS- cues for controlled descent LLE heel raise 10 reps - pt reports doing this exercise regularly at home  UPDATED HEP AS FOLLOWS:  Medbridge Access Code: [email or web address] URL: [email or web address] Date: 08/20/2023 Prepared by: Rock Kussmaul  Exercises - Supine Active Straight Leg Raise  - 1 x daily - 7 x weekly - 3 sets - 10 reps - Prone Knee Flexion  - 1 x daily - 7 x weekly - 3 sets - 10 reps - Prone Hip Extension  - 1 x daily - 7 x weekly - 3 sets - 10 reps - Prone Hip Extension with Bent Knee - Two Pillows  - 1 x daily - 7 x weekly - 1 sets - 10 reps - Single Leg Bridge  - 1 x daily - 7 x weekly - 1 sets - 10 reps - Marching Bridge  - 1 x daily - 7 x weekly - 1 sets - 10 reps  -------------------------------------------------------------   Access Code: [email or web address] URL: [email or web address] Date: 07/31/2023 Prepared by: Rock Kussmaul  Exercises - Seated Ankle Dorsiflexion with Resistance  - 1 x daily - 7 x weekly - 3 sets - 10 reps - 5 hold - Standing Repeated Hip Flexion with Resistance  - 1 x daily - 7 x weekly - 3 sets - 10 reps - Hip and Knee Flexion with Anchored Resistance  - 1 x daily - 7 x weekly - 3 sets - 10 reps - Standing Repeated Hip Extension with Resistance  - 1 x daily - 7 x weekly - 3 sets - 10 reps - Standing Hip Abduction with Theraband Resistance  - 1 x daily - 7 x weekly - 3 sets - 10 reps - Toe Walking  - 1 x daily - 7 x weekly - 1-2 sets - 10 reps - Heel Walking  - 1 x daily - 7 x weekly - 1-2 sets - 10 reps     Medbridge HEP:    Access Code: [facility] URL: [email or web address] Date: 07/24/2023 Prepared by: Rock Kussmaul  Exercises - Single Leg Heel Raise  - 1 x daily - 7 x weekly - 3 sets - 10 reps - 3-4 sec hold - Supine Hip Flexion with Resistance  - 1 x daily - 7 x weekly - 3 sets - 10 reps - Seated Hamstring Curl with Anchored Resistance  - 1 x daily - 7 x weekly - 3 sets - 10 reps - Sidelying Hip  Abduction and Extension with Loop Band  - 1 x daily - 7 x weekly - 3 sets - 10 reps - Romberg Stance Eyes Closed on Foam Pad  - 1 x daily - 7 x weekly - 1 sets - 1-2 reps - 30 sec  hold - Single Leg Stance with Support  - 1 x daily - 7 x weekly - 1-2 sets - 2 reps - 10  sec hold   Pt education:  HEP, POC, and eval results Person educated: Patient Education method: Explanation, Demonstration, and Handouts Education comprehension: verbalized understanding, returned demonstration, and needs further education  HOME EXERCISE PROGRAM: See above  GOALS: Goals reviewed with patient? Yes  SHORT TERM GOALS: same as LTG's as ELOS = 4 weeks    LONG TERM GOALS: Target date: 08-24-23  Pt will increase gait velocity to >/= 3.2 ft/sec without device for increased  gait efficiency. Baseline: 2.79 ft/sec without use of assistive device Goal status: INITIAL  2.  Pt will subjectively report at least 25% improvement of left leg feeling heavy after amb. 30 for walking program. Baseline: Pt reports LLE feels very heavy and impacts gait pattern after ambulating 30 - results in decreased gait speed Goal status: INITIAL  3.  Pt will demonstrate improved balance and increased vestibular input by standing for 30 secs on compliant surface with feet together with minimal postural sway. Baseline: 14.94 secs with moderate postural sway Goal status: INITIAL  4.  Demonstrate improved balance by performing SLS on LLE for at least 10 secs for increased safety with step negotiation and stepping over objects.  Baseline: 7.62 secs on LLE on 3rd trial Goal status: INITIAL  5.  Independent in HEP for LLE strengthening and balance/vestibular exercises. Baseline: Dependent Goal status: INITIAL   ASSESSMENT:  CLINICAL IMPRESSION: Today's PT session focused on strengthening exercises for LLE with use of 3# and 4# weights in supine and prone positions for anti-gravity positions for increased strengthening and  eccentric control.  Pt most challenged by bridging exercises with component added and also with controlled descent of Lt knee extension from prone knee flexion (eccentric Lt hamstring).  Pt tolerated exercises well with minimal c/o fatigue at end of session.  Cont with POC.     OBJECTIVE IMPAIRMENTS: Abnormal gait, decreased balance, and decreased strength.   ACTIVITY LIMITATIONS: stairs and locomotion level  PARTICIPATION LIMITATIONS: community activity and yard work  PERSONAL FACTORS: Past/current experiences, Time since onset of injury/illness/exacerbation, and 1 comorbidity: h/o CVA I 2015 are also affecting patient's functional outcome.   REHAB POTENTIAL: Excellent  CLINICAL DECISION MAKING: Evolving/moderate complexity  EVALUATION COMPLEXITY: Moderate  PLAN:  PT FREQUENCY: 1x/week  PT DURATION: 4 weeks+ eval  PLANNED INTERVENTIONS: 97110-Therapeutic exercises, 97530- Therapeutic activity, 97112- Neuromuscular re-education, 97535- Self Care, and 02883- Gait training  PLAN FOR NEXT SESSION: check HEP - add exercises with use of weights:  continue LLE strengthening, vestibular/balance exercises; plantarflexion exercise off side of mat table   Abuk Selleck, [place], PT 08/20/2023, 7:25 PM   Check all possible CPT codes: 97110- Therapeutic Exercise, [phone]- Neuro Re-education, [phone] - Gait Training, [phone] - Therapeutic Activities, and 02464 - Self Care    Check all conditions that are expected to impact treatment: Diabetes mellitus and Neurological condition and/or seizures   If treatment provided at initial evaluation, no treatment charged due to lack of authorization.

## 2023-08-20 ENCOUNTER — Encounter: Payer: Self-pay | Admitting: Physical Therapy

## 2023-08-25 DIAGNOSIS — Z940 Kidney transplant status: Principal | ICD-10-CM

## 2023-09-01 ENCOUNTER — Ambulatory Visit: Payer: Self-pay | Admitting: Physical Therapy

## 2023-09-08 DIAGNOSIS — N186 End stage renal disease: Principal | ICD-10-CM

## 2023-09-08 DIAGNOSIS — E1022 Type 1 diabetes mellitus with diabetic chronic kidney disease: Principal | ICD-10-CM

## 2023-09-08 DIAGNOSIS — B078 Other viral warts: Principal | ICD-10-CM

## 2023-09-08 DIAGNOSIS — T8619 Other complication of kidney transplant: Principal | ICD-10-CM

## 2023-09-08 DIAGNOSIS — L738 Other specified follicular disorders: Principal | ICD-10-CM

## 2023-09-08 DIAGNOSIS — N1831 Type 1 diabetes mellitus with stage 3a chronic kidney disease: Principal | ICD-10-CM

## 2023-09-08 MED ORDER — CLINDAMYCIN PHOSPHATE 1 % TOPICAL SOLUTION
TOPICAL | 11 refills | 0.00000 days | Status: CP
Start: 2023-09-08 — End: ?

## 2023-09-08 NOTE — Unmapped (Signed)
 Dermatology Note     Assessment and Plan:      Assessment & Plan    Neoplasm Unsp x 1 (scalp): Ddx includes pseudofolliculitis vs verruca plana     Risks, benefits and adverse effects of biopsy were reviewed.  After obtaining verbal consent, the biopsy site was anesthetized using 1% lidocaine  with epinephrine.  Tissue site was obtained using a dermblade and hemostasis was achieved using drysol.  The site was covered with vaselline and a bandaid. Post-procedure wound care instructions were reviewed.      Patient will be contacted with results once available and definitive treatment will be arranged if needed.       Pseudofolliculitis barbae  Dark pigmentation on the scalp and neck, likely related to pseudofolliculitis barbae due to shaving.  - Prescribed clindamycin  solution for daily use on neck and scalp.  - Advised to monitor for improvement over six months and report back if no significant change.  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  - Discussed increased risk of skin cancer development due to immunosuppression.  - Discussed increased risk for verrucae due to immunosuppression.  - Reviewed importance of early detection and treatment.    The patient was advised to call for an appointment should any new, changing, or symptomatic lesions develop.     RTC:  18 months or sooner as needed  _________________________________________________________________      Chief Complaint     No chief complaint on file.      HPI     Patrick Vazquez is a 48 y.o. male who presents as a returning patient (last seen 03/25/2022) to Dermatology for a full body skin exam. Patient reports no specific lesions or rash of concern.     History of Present Illness  Patrick Vazquez is a 48 year old male with immunosuppression who presents with dark splotches on his scalp.    Dark patches on the scalp persist despite shaving and are not associated with itching or discomfort. The patches are distributed around the scalp. He frequently wears a hat and maintains a bald head. He is on transplant medications with no changes since 2017, though doses have been adjusted. No similar pigment changes are noted elsewhere on the body. He is concerned about the appearance of the dark patches.      The patient denies any other new or changing lesions or areas of concern.     Pertinent Past Medical History     Problem List          Endocrine    Type 1 diabetes mellitus with stage 3a chronic kidney disease          Genitourinary    End-stage renal failure with renal transplant    - Primary     Type I DM  ESRD with renal transplant  Chronic immunosuppression with Prograf  (tacrolimus ) and Myfortic  since 2017     Family History:   Negative for melanoma  Positive for ALS  No history of other cancers    Past Medical History, Family History, Social History, Medication List, Allergies, and Problem List were reviewed in the rooming section of Epic.     Physical Examination     Physical Exam: Patient is a well developed african [place] male in no apparent distress.  Alert and oriented x  3.  Skin: Examination of the patient's head, neck, chest, abdomen, back, buttocks, bilateral upper and lower extremities was performed and all areas not specifically commented on were within normal limits:  -- several scattered stuck-on tan macules and papules over trunk and extremities   -- scattered tan macules, rhytids and speckled pigment change c/w previous sun exposure   -- scattered hyperpigmented thin follicularly based papules on the scalp and neck       (Approved Template 03/28/2023)

## 2023-09-08 NOTE — Unmapped (Signed)
 Patient Education        Skin Cancer Prevention: Care Instructions  Your Care Instructions     Skin cancer is the abnormal growth of cells in the skin. It usually appears as a growth that changes in color, shape, or size. This can be a sore that does not heal or a change in a mole. Skin cancer is almost always curable when found early and treated. So it is important to see your doctor if you have any of these changes in your skin.  Skin cancer is the most common type of cancer. It often appears on areas of the body that have been exposed to the sun, such as the head, face, neck, back, chest, or shoulders.  Follow-up care is a key part of your treatment and safety. Be sure to make and go to all appointments, and call your doctor if you are having problems. It's also a good idea to know your test results and keep a list of the medicines you take.  How can you care for yourself at home?  Wear a wide-brimmed hat and long sleeves and pants if you are going to be outdoors for a long time.  Avoid the sun between 10 a.m. and 4 p.m., which is the peak time for UV rays.  Wear sunscreen on exposed skin. Make sure to use a broad-spectrum sunscreen that has a sun protection factor (SPF) of 30 or higher. Use it every day, even when it is cloudy.  Do not use tanning booths or sunlamps.  Use lip balm or cream that has sun protection factor (SPF) to protect your lips from getting sunburned.  Wear sunglasses that block UV rays.  When should you call for help?  Watch closely for changes in your health, and be sure to contact your doctor if:    You see a change in your skin, such as a growth or mole that:  Grows bigger. This may happen very slowly.  Changes color.  Changes shape.  Starts to bleed easily.     You have swollen glands in your armpits, groin, or neck.     You do not get better as expected.   Where can you learn more?  Go to [facility] at [email or web address] forces logistics/support/administrative officer in the Menu. Enter P392 in the search box to learn more about Skin Cancer Prevention: Care Instructions.  Current as of: January 28, 2023  Content Version: 14.5  ?? 2024-2025 [place], [place].   Care instructions adapted under license by [facility]. If you have questions about a medical condition or this instruction, always ask your healthcare professional. Romayne Alderman, [facility], disclaims any warranty or liability for your use of this information.

## 2023-09-10 ENCOUNTER — Ambulatory Visit: Payer: Self-pay | Attending: Nurse Practitioner | Admitting: Physical Therapy

## 2023-09-10 DIAGNOSIS — R2689 Other abnormalities of gait and mobility: Secondary | ICD-10-CM | POA: Diagnosis present

## 2023-09-10 DIAGNOSIS — I69352 Hemiplegia and hemiparesis following cerebral infarction affecting left dominant side: Secondary | ICD-10-CM | POA: Insufficient documentation

## 2023-09-10 DIAGNOSIS — R2681 Unsteadiness on feet: Secondary | ICD-10-CM | POA: Diagnosis present

## 2023-09-10 NOTE — Therapy (Signed)
 OUTPATIENT PHYSICAL THERAPY NEURO TREATMENT NOTE   Patient Name: [name] MRN: [id number] DOB:1975-11-04, 48 y.o., male Today's Date: 09/12/2023   PCP: Casimir Curtistine DASEN., PA-C REFERRING PROVIDER: Margarete Maeola DASEN, FNP  END OF SESSION:  PT End of Session - 09/12/23 1857     Visit Number 4    Number of Visits 6   eval + 4 visits   Date for PT Re-Evaluation 10/02/23    Authorization Type [place] Medicaid [employer]    Authorization Time Period 07-30-23 - 09-27-23    Authorization - Visit Number 4    Authorization - Number of Visits 5    PT Start Time 1535    PT Stop Time 1620    PT Time Calculation (min) 45 min    Activity Tolerance Patient tolerated treatment well    Behavior During Therapy WFL for tasks assessed/performed             Past Medical History:  Diagnosis Date   Chronic kidney disease    GERD (gastroesophageal reflux disease)    Hypertension    Type 1 diabetes (HCC)    Past Surgical History:  Procedure Laterality Date   AV FISTULA PLACEMENT Left 07/28/2014   Procedure: ARTERIOVENOUS (AV) FISTULA CREATION;  Surgeon: Cordella KANDICE Shawl, MD;  Location: [facility] ORS;  Service: Vascular;  Laterality: Left;   ESOPHAGOGASTRODUODENOSCOPY N/A 04/08/2013   Procedure: ESOPHAGOGASTRODUODENOSCOPY (EGD);  Surgeon: Margo LITTIE Haddock, MD;  Location: [facility];  Service: Endoscopy;  Laterality: N/A;  8:30   INSERTION OF DIALYSIS CATHETER  11/2012   PERIPHERAL VASCULAR CATHETERIZATION N/A 10/23/2014   Procedure: Dialysis/Perma Catheter Removal;  Surgeon: Selinda GORMAN Gu, MD;  Location: [facility] INVASIVE CV LAB;  Service: Cardiovascular;  Laterality: N/A;   Patient Active Problem List   Diagnosis Date Noted   Aftercare following organ transplant 05/14/2016   Chronic pain syndrome 02/28/2016   Diabetic ketoacidosis associated with type 1 diabetes mellitus (HCC) 02/28/2016   Hypertension 02/28/2016   Kidney replaced by transplant 01/28/2016   ESRD on hemodialysis (HCC) 06/01/2015    Uncontrolled insulin dependent type 1 diabetes mellitus 06/01/2015   Diastolic dysfunction, left ventricle 11/15/2014   Former smoker 11/15/2014   Gastritis 11/15/2014   Gastroesophageal reflux disease with esophagitis 11/15/2014   Hypertension secondary to other renal disorders (CODE) 11/15/2014   Type 1 diabetes mellitus (HCC) 11/15/2014   Dyspepsia 03/16/2013    ONSET DATE:  Referral date   REFERRING DIAGNOSIS:  Z86.73 (ICD-10-CM) - Personal history of transient ischemic attack (TIA), and cerebral infarction without residual deficits  THERAPY DIAG:  Other abnormalities of gait and mobility - Plan: PT plan of care cert/re-cert  Hemiplegia and hemiparesis following cerebral infarction affecting left dominant side (HCC) - Plan: PT plan of care cert/re-cert  Rationale for Evaluation and Treatment: Rehabilitation  SUBJECTIVE:  SUBJECTIVE STATEMENT: Pt reports he is doing well - was on [place] last week, near [place] area - did a lot of walking.  Continues to concentrate and focus on how he is walking, trying to get his Lt heel down first Pt accompanied by: self  PERTINENT HISTORY: kidney transplant Dec. 2017; h/o CVA 2015 with Lt hemiparesis, type 1 DM   PAIN:  Are you having pain? No  PRECAUTIONS: None  RED FLAGS: None   WEIGHT BEARING RESTRICTIONS: No  FALLS: Has patient fallen in last 6 months? No  LIVING ENVIRONMENT: Lives with: lives alone Lives in: House/apartment - lives on 2nd level - in an apartment Stairs: Yes: External: 12 steps; can reach both; pt reports he has more difficulty going down steps than going up Has following equipment at home: None  PLOF: Independent  PATIENT GOALS: increase strength Lt leg and improve balance  OBJECTIVE:  Note: Objective measures were  completed at Evaluation unless otherwise noted.  DIAGNOSTIC FINDINGS: N/A  COGNITION: Overall cognitive status: Within functional limits for tasks assessed   SENSATION: WFL  COORDINATION: WNL's bil. LE's  POSTURE: No Significant postural limitations  LOWER EXTREMITY ROM:  WNL's bil. LE's   LOWER EXTREMITY MMT:    MMT Right Eval Left Eval  Hip flexion 5 4  Hip extension 4+ 4-  Hip abduction    Hip adduction    Hip internal rotation    Hip external rotation    Knee flexion  4-  Knee extension 5   Ankle dorsiflexion 5 4  Ankle plantarflexion 5 4-  Ankle inversion    Ankle eversion    (Blank rows = not tested)  BED MOBILITY:  Findings: Independent  TRANSFERS: Sit to stand: Complete Independence  Assistive device utilized: None     Able to stand from mat table without UE support   STAIRS: Not tested GAIT: Findings: Gait Characteristics: step through pattern, Distance walked: 150', Assistive device utilized:None, Level of assistance: Complete Independence, and Comments: decreased push off LLE due to Lt plantarflexor weakness  FUNCTIONAL TESTS:  Timed up and go (TUG): NT 10 meter walk test: 11.74 secs= 2.79 ft/sec without device MCTSIB: Condition 1: Avg of 3 trials: 30 sec, Condition 2: Avg of 3 trials: 30 sec, Condition 3: Avg of 3 trials: 30 sec, Condition 4: Avg of 3 trials: 14.94 sec, and Total Score: 105/120 SLS = RLE = 6.3 secs:  LLE= 7.62 secs (3rd trial)                                                                                                                               TREATMENT DATE: 09-10-23  TherEx:   Pt performed following exercises for HEP for LLE strengthening; - 4# used for PRE's  Lt SLR - 4# 10 reps: 2 sets Lt hip flexion with knee flexed - 4# - in hooklying position Lt unilateral bridge 10 reps Bridge with RLE extension10 reps Bridge with marching  10 reps Bridge with hip abdct/adduct 10 reps Lt knee flexion prone 4# 10 reps  Lt  hip extension with knee flexed - hip extension control exercise off side of mat with 3# weight 10 reps  TherAct: Gait velocity - 9.16 secs =  3.58 ft/sec without device SLS - on LLE 3.88 secs average of 3 trials Pt stood on Airex with feet together for 30 secs with min. Postural sway - no LOB occurred    UPDATED HEP AS FOLLOWS:  Medbridge Access Code: [email or web address] URL: [email or web address] Date: 08/20/2023 Prepared by: Rock Kussmaul  Exercises - Supine Active Straight Leg Raise  - 1 x daily - 7 x weekly - 3 sets - 10 reps - Prone Knee Flexion  - 1 x daily - 7 x weekly - 3 sets - 10 reps - Prone Hip Extension  - 1 x daily - 7 x weekly - 3 sets - 10 reps - Prone Hip Extension with Bent Knee - Two Pillows  - 1 x daily - 7 x weekly - 1 sets - 10 reps - Single Leg Bridge  - 1 x daily - 7 x weekly - 1 sets - 10 reps - Marching Bridge  - 1 x daily - 7 x weekly - 1 sets - 10 reps  -------------------------------------------------------------   Access Code: [email or web address] URL: [email or web address] Date: 07/31/2023 Prepared by: Rock Kussmaul  Exercises - Seated Ankle Dorsiflexion with Resistance  - 1 x daily - 7 x weekly - 3 sets - 10 reps - 5 hold - Standing Repeated Hip Flexion with Resistance  - 1 x daily - 7 x weekly - 3 sets - 10 reps - Hip and Knee Flexion with Anchored Resistance  - 1 x daily - 7 x weekly - 3 sets - 10 reps - Standing Repeated Hip Extension with Resistance  - 1 x daily - 7 x weekly - 3 sets - 10 reps - Standing Hip Abduction with Theraband Resistance  - 1 x daily - 7 x weekly - 3 sets - 10 reps - Toe Walking  - 1 x daily - 7 x weekly - 1-2 sets - 10 reps - Heel Walking  - 1 x daily - 7 x weekly - 1-2 sets - 10 reps     Medbridge HEP:    Access Code: [facility] URL: [email or web address] Date: 07/24/2023 Prepared by: Rock Kussmaul  Exercises - Single Leg Heel Raise  - 1 x daily - 7 x weekly - 3 sets - 10 reps - 3-4 sec  hold - Supine Hip Flexion with Resistance  - 1 x daily - 7 x weekly - 3 sets - 10 reps - Seated Hamstring Curl with Anchored Resistance  - 1 x daily - 7 x weekly - 3 sets - 10 reps - Sidelying Hip Abduction and Extension with Loop Band  - 1 x daily - 7 x weekly - 3 sets - 10 reps - Romberg Stance Eyes Closed on Foam Pad  - 1 x daily - 7 x weekly - 1 sets - 1-2 reps - 30 sec  hold - Single Leg Stance with Support  - 1 x daily - 7 x weekly - 1-2 sets - 2 reps - 10  sec hold   Pt education:  HEP, POC, and eval results Person educated: Patient Education method: Explanation, Demonstration, and Handouts Education comprehension: verbalized understanding, returned demonstration, and needs further education  HOME EXERCISE PROGRAM: See above  GOALS: Goals reviewed with patient? Yes  SHORT TERM  GOALS: same as LTG's as ELOS = 4 weeks    LONG TERM GOALS: Target date: 08-24-23  Pt will increase gait velocity to >/= 3.2 ft/sec without device for increased gait efficiency. Baseline: 2.79 ft/sec without use of assistive device;  9.16 secs =  3.58 ft/sec without device Goal status: Goal met 09-10-23  2.  Pt will subjectively report at least 25% improvement of left leg feeling heavy after amb. 30 for walking program. Baseline: Pt reports LLE feels very heavy and impacts gait pattern after ambulating 30 - results in decreased gait speed Goal status: Goal met 09-10-23  3.  Pt will demonstrate improved balance and increased vestibular input by standing for 30 secs on compliant surface with feet together with minimal postural sway. Baseline: 14.94 secs with moderate postural sway:  30 secs -- 09-10-23 Goal status: Goal met 09-10-23  4.  Demonstrate improved balance by performing SLS on LLE for at least 10 secs for increased safety with step negotiation and stepping over objects.  Baseline: 7.62 secs on LLE on 3rd trial; 09-10-23;   3.88 secs average of 3 trials Goal status: In Progress   5.   Independent in HEP for LLE strengthening and balance/vestibular exercises. Baseline: Dependent Goal status: Ongoing  09-10-23   UPDATED LTG'S:  TARGET DATE 10-09-23 (2 ADDITIONAL VISITS ONLY)  1.  Demonstrate improved balance by performing SLS on LLE for at least 10 secs for increased safety with step negotiation and stepping over objects.  Baseline: 7.62 secs on LLE on 3rd trial; 09-10-23;   3.88 secs average of 3 trials Goal status: In Progress   2.  Independent in HEP for LLE strengthening and balance/vestibular exercises. Baseline: Dependent Goal status: Ongoing  09-10-23         ASSESSMENT:  CLINICAL IMPRESSION: Pt has met LTG's #1-3:  LTG #4 is not met in today's session but is ongoing for next 2 weeks.  LTG #5 is ongoing as HEP continues to be updated as appropriate.  Pt demonstrated increased strength in LLE in today's session as able to perform PRE's with 4# weight, increased from 3# used in previous PT session.  Pt is progressing well with improving gait pattern with increased Lt initial heel contact exhibited in stance phase of gait.  Renewal to be completed for 2 visits.  Cont with POC.     OBJECTIVE IMPAIRMENTS: Abnormal gait, decreased balance, and decreased strength.   ACTIVITY LIMITATIONS: stairs and locomotion level  PARTICIPATION LIMITATIONS: community activity and yard work  PERSONAL FACTORS: Past/current experiences, Time since onset of injury/illness/exacerbation, and 1 comorbidity: h/o CVA I 2015 are also affecting patient's functional outcome.   REHAB POTENTIAL: Excellent  CLINICAL DECISION MAKING: Evolving/moderate complexity  EVALUATION COMPLEXITY: Moderate  PLAN:  PT FREQUENCY: 1x/week  PT DURATION: 4 weeks+ eval  PLANNED INTERVENTIONS: 97110-Therapeutic exercises, 97530- Therapeutic activity, 97112- Neuromuscular re-education, 97535- Self Care, and 02883- Gait training  PLAN FOR NEXT SESSION: check HEP - add exercises with use of weights:  continue  LLE strengthening, vestibular/balance exercises; plantarflexion exercise off side of mat table   Madeeha Costantino, [place], PT 09/12/2023, 9:01 PM   Check all possible CPT codes: 97110- Therapeutic Exercise, [phone]- Neuro Re-education, [phone] - Gait Training, [phone] - Therapeutic Activities, and 02464 - Self Care    Check all conditions that are expected to impact treatment: Diabetes mellitus and Neurological condition and/or seizures   If treatment provided at initial evaluation, no treatment charged due to lack of authorization.

## 2023-09-12 ENCOUNTER — Ambulatory Visit: Attending: Internal Medicine

## 2023-09-12 ENCOUNTER — Encounter: Payer: Self-pay | Admitting: Physical Therapy

## 2023-09-15 DIAGNOSIS — B078 Other viral warts: Principal | ICD-10-CM

## 2023-09-15 MED ORDER — FLUOROURACIL 5 % TOPICAL CREAM
TOPICAL | 2 refills | 0.00000 days | Status: CP
Start: 2023-09-15 — End: ?

## 2023-09-15 NOTE — Unmapped (Signed)
-----   Message from Larissa Seen, MD sent at 09/15/2023 12:49 PM EDT -----  Diagnosis   Scalp, shave:     Consistent with flat wart.        Pt notified - will have him use efudex  cream 2x/day for 1-3 weeks as tolerated.     Can schedule appt in 3-4 months in [facility]  ----- Message -----  From: Lab, Background User  Sent: 09/09/2023  11:42 AM EDT  To: Larissa Dennise Seen, MD

## 2023-09-15 NOTE — Unmapped (Signed)
 LVM and Recall sent for 3-75M follow up with [facility] in [facility] clinic

## 2023-09-18 ENCOUNTER — Ambulatory Visit: Admit: 2023-09-18 | Discharge: 2023-09-19 | Payer: Medicaid (Managed Care)

## 2023-09-18 DIAGNOSIS — I151 Hypertension secondary to other renal disorders: Principal | ICD-10-CM

## 2023-09-18 DIAGNOSIS — Z940 Kidney transplant status: Principal | ICD-10-CM

## 2023-09-18 DIAGNOSIS — Z796 Long term current use of immunosuppressive drug: Principal | ICD-10-CM

## 2023-09-18 LAB — URINALYSIS WITH MICROSCOPY
BACTERIA: NONE SEEN /HPF
BILIRUBIN UA: NEGATIVE
BLOOD UA: NEGATIVE
GLUCOSE UA: >1000 — AB
KETONES UA: NEGATIVE
LEUKOCYTE ESTERASE UA: NEGATIVE
NITRITE UA: NEGATIVE
PH UA: 5 (ref 5.0–9.0)
PROTEIN UA: NEGATIVE
RBC UA: <1 /HPF (ref <=3)
SPECIFIC GRAVITY UA: 1.011 (ref 1.003–1.030)
SQUAMOUS EPITHELIAL: <1 /HPF (ref 0–5)
UROBILINOGEN UA: <2
WBC UA: <1 /HPF (ref <=2)

## 2023-09-18 LAB — ALBUMIN / CREATININE URINE RATIO
ALBUMIN QUANT URINE: 1.2 mg/dL
ALBUMIN/CREATININE RATIO: 18 ug/mg (ref 0.0–30.0)
CREATININE, URINE: 66.7 mg/dL

## 2023-09-18 LAB — CBC W/ AUTO DIFF
BASOPHILS ABSOLUTE COUNT: 0 10*9/L (ref 0.0–0.1)
BASOPHILS RELATIVE PERCENT: 0.7 %
EOSINOPHILS ABSOLUTE COUNT: 0.1 10*9/L (ref 0.0–0.5)
EOSINOPHILS RELATIVE PERCENT: 2.6 %
HEMATOCRIT: 39.2 % (ref 39.0–48.0)
HEMOGLOBIN: 12.7 g/dL — ABNORMAL LOW (ref 12.9–16.5)
LYMPHOCYTES ABSOLUTE COUNT: 1.4 10*9/L (ref 1.1–3.6)
LYMPHOCYTES RELATIVE PERCENT: 28.3 %
MEAN CORPUSCULAR HEMOGLOBIN CONC: 32.4 g/dL (ref 32.0–36.0)
MEAN CORPUSCULAR HEMOGLOBIN: 27.3 pg (ref 25.9–32.4)
MEAN CORPUSCULAR VOLUME: 84.1 fL (ref 77.6–95.7)
MEAN PLATELET VOLUME: 9.6 fL (ref 6.8–10.7)
MONOCYTES ABSOLUTE COUNT: 0.4 10*9/L (ref 0.3–0.8)
MONOCYTES RELATIVE PERCENT: 8.8 %
NEUTROPHILS ABSOLUTE COUNT: 3 10*9/L (ref 1.8–7.8)
NEUTROPHILS RELATIVE PERCENT: 59.6 %
PLATELET COUNT: 219 10*9/L (ref 150–450)
RED BLOOD CELL COUNT: 4.66 10*12/L (ref 4.26–5.60)
RED CELL DISTRIBUTION WIDTH: 15.1 % (ref 12.2–15.2)
WBC ADJUSTED: 5 10*9/L (ref 3.6–11.2)

## 2023-09-18 LAB — MAGNESIUM: MAGNESIUM: 1.7 mg/dL (ref 1.6–2.6)

## 2023-09-18 LAB — COMPREHENSIVE METABOLIC PANEL
ALBUMIN: 3.4 g/dL (ref 3.4–5.0)
ALKALINE PHOSPHATASE: 111 U/L (ref 46–116)
ALT (SGPT): 21 U/L (ref 10–49)
ANION GAP: 11 mmol/L (ref 5–14)
AST (SGOT): 22 U/L (ref <=34)
BILIRUBIN TOTAL: 0.3 mg/dL (ref 0.3–1.2)
BLOOD UREA NITROGEN: 28 mg/dL — ABNORMAL HIGH (ref 9–23)
BUN / CREAT RATIO: 18
CALCIUM: 9.1 mg/dL (ref 8.7–10.4)
CHLORIDE: 107 mmol/L (ref 98–107)
CO2: 16.5 mmol/L — ABNORMAL LOW (ref 20.0–31.0)
CREATININE: 1.58 mg/dL — ABNORMAL HIGH (ref 0.73–1.18)
EGFR CKD-EPI (2021) MALE: 54 mL/min/1.73m2 — ABNORMAL LOW (ref >=60)
GLUCOSE RANDOM: 449 mg/dL (ref 70–179)
POTASSIUM: 5.1 mmol/L — ABNORMAL HIGH (ref 3.4–4.8)
PROTEIN TOTAL: 7.7 g/dL (ref 5.7–8.2)
SODIUM: 134 mmol/L — ABNORMAL LOW (ref 135–145)

## 2023-09-18 LAB — PHOSPHORUS: PHOSPHORUS: 1.6 mg/dL — ABNORMAL LOW (ref 2.4–5.1)

## 2023-09-18 LAB — PROTEIN / CREATININE RATIO, URINE
CREATININE, URINE: 66.7 mg/dL
PROTEIN URINE: 10.4 mg/dL
PROTEIN/CREAT RATIO, URINE: 0.156

## 2023-09-18 MED ORDER — HYDROCHLOROTHIAZIDE 12.5 MG TABLET
ORAL_TABLET | Freq: Every day | ORAL | 3 refills | 100.00000 days | Status: CP
Start: 2023-09-18 — End: 2024-10-22

## 2023-09-18 MED ORDER — CARVEDILOL 12.5 MG TABLET
ORAL_TABLET | Freq: Two times a day (BID) | ORAL | 3 refills | 90.00000 days | Status: CP
Start: 2023-09-18 — End: 2024-09-17

## 2023-09-18 NOTE — Unmapped (Signed)
 [facility]   TRANSPLANT FOLLOW UP     PCP: Margarete Maeola [place], FNP   Kidney transplant coordinator: Tinnie CHRISTELLA Hanlon    Date of Visit at Transplant clinic: 09/18/2023     Assessment/Recommendations:     # s/p deceased donor kidney transplant 01/28/2016   Native kidney disease T1DM.  Graft function: Cr 1.4-1.8, without proteinuria. Cr up to 2.0, he thinks less hydrated, will repeat labs after PO hydration  DSAs: none detected Jan 2024  Has LUE AVF which thrombosed, no issues    # Immunosuppression  Tacrolimus  (Prograf ) 3 mg BID, goal trough 5-7 ng/mL   Mycophenolate  (Myfortic ) 360 mg BID    # Acute issues today  none    # BP management   Goal 130/80, as tolerated. His BP is at goal but has some mild leg edema.  - amlodipine  10 mg daily  - carvedilol  reduce to 12.5 mg BID  - add hydrochlorothiazide  12.5 mg daily  - olmesartan  20 mg daily    # Infectious disease  CMV D+/R-, EBV D+/R+    # Anemia screening  Hb normal    # Cardiovascular: primary CAD prevention  # History of stroke Sept 2015 due to uncontrolled HTN - LLE weakness, treated at [facility], with full recovery.  -The 10-year ASCVD risk score (Arnett DK, et al., 2019) is: 14.6%  - Atorvastatin  40 mg daily  - ASA 81 mg daily    # CKD-BMD  Ca and phos normal  PTH 99.6 07/31/21    # Electrolytes  Mg not needing supp    # Comorbidities   Type 1 DM: HbA1c 7.2 as of Jan 2024   - NPH 30 units AM, 42 units PM  - Humalog  SSI meals  - Mounjaro 10 mg weekly  Endocrinologist: Dr. Damian [facility] in [place]), sees q3 months    Weight management:  - had weight loss benefit with Trulicity, but stopped due to diarrhea with increased dose  - stopped Ozempic  because of GI symptoms  - follows up with nutritionist Mateo Glance) and weight management clinic for weight loss    # Immunizations  - Recommended the Shingrix vaccine  Immunization History   Administered Date(s) Administered    COVID-19 VACCINE,MRNA(MODERNA)(PF) 04/27/2019, 06/01/2019, 11/01/2019, 06/27/2020, 12/19/2021    Covid-19 Vac, (21yr+) (Comirnaty) Mrna Pfizer  01/02/2023    Covid-19 Vac, (4yr+) (Spikevax) Monovalent Moderna 12/19/2021    INFLUENZA VACCINE IIV3(IM)(PF)6 MOS UP 03/11/2023    Influenza Vaccine Quad(IM)6 MO-Adult(PF) 03/03/2017, 10/27/2017, 11/25/2018, 01/04/2020, 02/27/2021, 12/05/2021    Influenza Virus Vaccine, unspecified formulation 11/09/2015, 03/03/2017, 10/27/2017    Pneumococcal Conjugate 20-valent 03/07/2022    Pneumococcal conjugate -PCV7 03/07/2022       # Cancer screening  Colonoscopy: scheduled for 03/31/22  Skin: recommend yearly dermatology evaluation    # Follow up:  Labs monthly  Visits return in 6 months      Kidney Transplant History:   Date of Transplant: 01/28/2016 (Kidney)  Type of Transplant: DDKT, DCD  KDPI: 89%  Cold ischemic time: 1,046 minutes (17hr )  Warm ischemic time: 49 minutes  cPRA: 0%  HLA match:   Blood type: Donor O, Recipient O POS  ID: CMV D+/R-, EBV D+/R+  Native Kidney Disease: T1DM   Native kidney biopsy: no   Pre-transplant dialysis course: HD since 12/08/2012  Post-Transplant Course:    Delayed graft function requiring dialysis: Yes   Other complications: no  Prior Transplants: None  Induction: Camp[place]  Early  steroid withdrawal: Yes    Biopsies:   Zero-Hour Biopsy: Moderate to severe arteriosclerosis observed in some of the vascular cross sections; Mild acute tubular injury.    History of Presenting Illness:     Since the last visit:  - doing well, BP at home 120s/70s  - had pulm visit yesterday  - forgot insulin  during a vacation, DM less controlled, he is back on his meds now    Concerns about nonadherence: No    Social: Lives in [place].    Review of Systems:   A 12-system review was negative except as documented in the HPI.    Physical Exam:     BP 126/72 (BP Site: R Arm, BP Position: Sitting, BP Cuff Size: Large)  - Pulse 82  - Temp 36.1 ??C (97 ??F) (Temporal)  - Ht 185.4 cm (6' 1)  - Wt (!) 128.2 kg (282 lb 9.6 oz)  - BMI 37.28 kg/m??   Constitutional:  Well-appearing in NAD  Eyes:  anicteric sclerae  ENT:  MMM  CV:  RRR, no m/r/g, no JVD, extremities WWP with no edema  Resp:  Good air movement, CTAB  GI:  Abdomen soft, NTND, +bs  MSK:  Grossly normal, exam is limited  Skin:  Normal turgor, no rash  Neuro:  Grossly normal, exam is limited  Psych:  Normal affect    Allergies: No Known Allergies     Current Medications:   Current Outpatient Medications   Medication Sig Dispense Refill    acetaminophen  (TYLENOL ) 325 MG tablet Take 1-2 tablets (325-650 mg total) by mouth every four (4) hours as needed for pain. 100 tablet 0    amlodipine  (NORVASC ) 10 MG tablet Take 1 tablet (10 mg total) by mouth daily. 90 tablet 3    aspirin (ECOTRIN) 81 MG tablet Take 1 tablet (81 mg total) by mouth daily. 30 tablet 11    atorvastatin  (LIPITOR) 40 MG tablet Take 1 tablet (40 mg total) by mouth every evening. 90 tablet 3    blood sugar diagnostic Strp Test Blood Sugar four times a day; E11.9 120 strip 5    carvedilol  (COREG ) 25 MG tablet Take 1 tablet (25 mg total) by mouth Two (2) times a day. 180 tablet 3    cholecalciferol , vitamin D3, (VITAMIN D3) 25 mcg (1,000 unit) Chew Chew 1 tablet daily.      clindamycin  (CLEOCIN  T) 1 % external solution Apply to scalp and neck daily to reduce inflammation from ingrown hairs 60 mL 11    fluorouracil  (EFUDEX ) 5 % cream Apply 2x/day for 1-3 weeks as tolerated to treat areas on the scalp and neck 40 g 2    HUMALOG  U-100 INSULIN  100 unit/mL injection       insulin  lispro (HUMALOG ) 100 unit/mL injection pen Inject 8 Units under the skin Three (3) times a day before meals. Dx code: E11.9 Diabetes (Patient taking differently: Inject 8 Units under the skin continuous. Dx code: E11.9 Diabetes) 15 mL 5    insulin  NPH (HUMULIN,NOVOLIN) 100 unit/mL injection Inject 20 units daily at 8:00 AM and inject 28 units daily at 10 PM.      insulin  syringe-needle U-100 (BD VEO INSULIN  SYRINGE UF) 1/2 mL 31 gauge x 15/64 (6 mm) Syrg CHECK BLOOD SUGAR 4 TIMES DAILY. 100 each 11    magnesium  oxide (MAG-OX) 400 mg (241.3 mg elemental magnesium ) tablet Take 1 tablet (400 mg total) by mouth two (2) times a day.      mycophenolate  (  MYFORTIC ) 180 MG EC tablet Take 2 tablets (360 mg total) by mouth Two (2) times a day. 360 tablet 3    olmesartan  (BENICAR ) 20 MG tablet Take 1 tablet (20 mg total) by mouth daily. 90 tablet 3    omeprazole  (PRILOSEC) 40 MG capsule Take 1 capsule (40 mg total) by mouth daily. 90 capsule 3    tacrolimus  (PROGRAF ) 1 MG capsule Take 3 capsules (3 mg total) by mouth daily AND 3 capsules (3 mg total) nightly. 540 capsule 3     No current facility-administered medications for this visit.       Past Medical History:   Past Medical History:   Diagnosis Date    CVA (cerebral vascular accident)    10/2013    Diabetes mellitus        Type 1    Diabetic ketoacidosis associated with type 1 diabetes mellitus    02/28/2016    ESRD (end stage renal disease)        HTN (hypertension)         Laboratory studies:   Reviewed recent results.

## 2023-09-18 NOTE — Unmapped (Signed)
 Today:  To help with your mild swelling which causes you to have stiffness:  - reduce carvedilol  to 12.5 mg twice daily  - add hydrochlorothiazide  12.5 mg once daily  - goal top number BP is 110-130, please call if you're out of range persistently    Dietary advice:  - low to moderate salt (high salt usually worsens swelling)  - half the plate with veggies to help in your weight management goals  - fluid intake: 70-80 oz daily of water, or other healthy fluids (4 standard water bottles daily will get you to this goal - 16.9 oz water bottles)

## 2023-09-18 NOTE — Unmapped (Signed)
 Transplant Coordinator, Clinic Visit   Pt seen today by transplant nephrology for follow up, reviewed medications and symptoms.          09/18/23 1013   Weight: (!) 128.2 kg (282 lb 9.6 oz)   Height: 185.4 cm (6' 1)   PainSc: 0-No pain       Assessment  BP @ Home: 120s/70s  BG: will do better, seen endo yesterday - no changes  HA/Dizziness/Lightheaded: No  Hand tremors: No  Numbness/tingling: No  Fevers/Chills/sweats: No  Chest Pain/SOB: No  N/V/Heartburn: No  Diarrhea/constipation: No  UTI symptoms: No  Swelling: Trace in bilat feet  Incision/Drain/Foley: N/A    Hydration status: 40 oz, advised to increase to 80-100 oz daily    Immunosuppressant last taken: 8:30 PM    Immunization status: UTD    Follow up local nephrologist: Dr. Douglas     Follow up PCP: Dr. Margarete    Lab frequency: Monthly    Plan:  - Will start decrease carvedilol  12.5 mg BID and start hydrochlorothiazide  12.5 mg daily.   - Seen pulmonology and endocrinology yesterday  - Derm follow up on 7/15 - biopsy on head/scalp  - Colonoscopy 03/2022  - Interested in therapy surrounding kidney transplant, advised him to reach out to PCP to refer to local therapist

## 2023-09-19 LAB — CMV DNA, QUANTITATIVE, PCR: CMV VIRAL LD: NOT DETECTED

## 2023-09-19 LAB — TACROLIMUS LEVEL: TACROLIMUS BLOOD: 6.2 ng/mL

## 2023-09-21 LAB — ALLOSURE KIDNEY: ALLOSURE: 0.16 %

## 2023-09-22 ENCOUNTER — Ambulatory Visit: Payer: Self-pay | Admitting: Physical Therapy

## 2023-09-22 DIAGNOSIS — R2689 Other abnormalities of gait and mobility: Secondary | ICD-10-CM

## 2023-09-22 DIAGNOSIS — R2681 Unsteadiness on feet: Secondary | ICD-10-CM

## 2023-09-22 DIAGNOSIS — I69352 Hemiplegia and hemiparesis following cerebral infarction affecting left dominant side: Secondary | ICD-10-CM

## 2023-09-22 NOTE — Therapy (Unsigned)
 OUTPATIENT PHYSICAL THERAPY NEURO TREATMENT NOTE/DISCHARGE SUMMARY   Patient Name: [name] MRN: [id number] DOB:21-May-1975, 48 y.o., male Today's Date: 09/23/2023   PCP: Casimir Curtistine DASEN., PA-C REFERRING PROVIDER: Margarete Maeola DASEN, FNP  END OF SESSION:  PT End of Session - 09/23/23 2003     Visit Number 5    Number of Visits 6   eval + 4 visits   Date for PT Re-Evaluation 10/02/23    Authorization Type [place] Medicaid [employer]    Authorization Time Period 07-30-23 - 09-27-23    Authorization - Visit Number 5    Authorization - Number of Visits 5    PT Start Time 1537    PT Stop Time 1623    PT Time Calculation (min) 46 min    Activity Tolerance Patient tolerated treatment well    Behavior During Therapy WFL for tasks assessed/performed             Past Medical History:  Diagnosis Date   Chronic kidney disease    GERD (gastroesophageal reflux disease)    Hypertension    Type 1 diabetes (HCC)    Past Surgical History:  Procedure Laterality Date   AV FISTULA PLACEMENT Left 07/28/2014   Procedure: ARTERIOVENOUS (AV) FISTULA CREATION;  Surgeon: Cordella KANDICE Shawl, MD;  Location: [facility] ORS;  Service: Vascular;  Laterality: Left;   ESOPHAGOGASTRODUODENOSCOPY N/A 04/08/2013   Procedure: ESOPHAGOGASTRODUODENOSCOPY (EGD);  Surgeon: Margo LITTIE Haddock, MD;  Location: [facility];  Service: Endoscopy;  Laterality: N/A;  8:30   INSERTION OF DIALYSIS CATHETER  11/2012   PERIPHERAL VASCULAR CATHETERIZATION N/A 10/23/2014   Procedure: Dialysis/Perma Catheter Removal;  Surgeon: Selinda GORMAN Gu, MD;  Location: [facility] INVASIVE CV LAB;  Service: Cardiovascular;  Laterality: N/A;   Patient Active Problem List   Diagnosis Date Noted   Aftercare following organ transplant 05/14/2016   Chronic pain syndrome 02/28/2016   Diabetic ketoacidosis associated with type 1 diabetes mellitus (HCC) 02/28/2016   Hypertension 02/28/2016   Kidney replaced by transplant 01/28/2016   ESRD on  hemodialysis (HCC) 06/01/2015   Uncontrolled insulin dependent type 1 diabetes mellitus 06/01/2015   Diastolic dysfunction, left ventricle 11/15/2014   Former smoker 11/15/2014   Gastritis 11/15/2014   Gastroesophageal reflux disease with esophagitis 11/15/2014   Hypertension secondary to other renal disorders (CODE) 11/15/2014   Type 1 diabetes mellitus (HCC) 11/15/2014   Dyspepsia 03/16/2013    ONSET DATE:  Referral date   REFERRING DIAGNOSIS:  Z86.73 (ICD-10-CM) - Personal history of transient ischemic attack (TIA), and cerebral infarction without residual deficits  THERAPY DIAG:  Other abnormalities of gait and mobility  Hemiplegia and hemiparesis following cerebral infarction affecting left dominant side (HCC)  Unsteadiness on feet  Rationale for Evaluation and Treatment: Rehabilitation  SUBJECTIVE:  SUBJECTIVE STATEMENT: Pt reports he continues to do well - still doing exercises at home; wants to be able to do a full sit up, which he can't do at this time - wants to increase abdominal strength Pt accompanied by: self  PERTINENT HISTORY: kidney transplant Dec. 2017; h/o CVA 2015 with Lt hemiparesis, type 1 DM   PAIN:  Are you having pain? No  PRECAUTIONS: None  RED FLAGS: None   WEIGHT BEARING RESTRICTIONS: No  FALLS: Has patient fallen in last 6 months? No  LIVING ENVIRONMENT: Lives with: lives alone Lives in: House/apartment - lives on 2nd level - in an apartment Stairs: Yes: External: 12 steps; can reach both; pt reports he has more difficulty going down steps than going up Has following equipment at home: None  PLOF: Independent  PATIENT GOALS: increase strength Lt leg and improve balance  OBJECTIVE:  Note: Objective measures were completed at Evaluation unless  otherwise noted.  DIAGNOSTIC FINDINGS: N/A  COGNITION: Overall cognitive status: Within functional limits for tasks assessed   SENSATION: WFL  COORDINATION: WNL's bil. LE's  POSTURE: No Significant postural limitations  LOWER EXTREMITY ROM:  WNL's bil. LE's   LOWER EXTREMITY MMT:    MMT Right Eval Left Eval  Hip flexion 5 4  Hip extension 4+ 4-  Hip abduction    Hip adduction    Hip internal rotation    Hip external rotation    Knee flexion  4-  Knee extension 5   Ankle dorsiflexion 5 4  Ankle plantarflexion 5 4-  Ankle inversion    Ankle eversion    (Blank rows = not tested)  BED MOBILITY:  Findings: Independent  TRANSFERS: Sit to stand: Complete Independence  Assistive device utilized: None     Able to stand from mat table without UE support   STAIRS: Not tested GAIT: Findings: Gait Characteristics: step through pattern, Distance walked: 150', Assistive device utilized:None, Level of assistance: Complete Independence, and Comments: decreased push off LLE due to Lt plantarflexor weakness  FUNCTIONAL TESTS:  Timed up and go (TUG): NT 10 meter walk test: 11.74 secs= 2.79 ft/sec without device MCTSIB: Condition 1: Avg of 3 trials: 30 sec, Condition 2: Avg of 3 trials: 30 sec, Condition 3: Avg of 3 trials: 30 sec, Condition 4: Avg of 3 trials: 14.94 sec, and Total Score: 105/120 SLS = RLE = 6.3 secs:  LLE= 7.62 secs (3rd trial)                                                                                                                               TREATMENT DATE: 09-22-23  TherEx:   Pt performed following exercises for HEP for LLE strengthening and core strengthening  Lt knee flexion prone 10 reps  Lt hip extension with knee flexed - 2 sets 10 reps Plank - in prone position - 15 sec hold x 2 reps; side plank in Rt sidelying 10 sec hold x  1 rep;  side plank in Lt sidelying 10 sec hold x 1 rep  Abdominal strengthening - pt reclined on inverted chair  with 1 pillow -10 reps Crunches - hands behind head with elbows abducted 10 reps  NeuroRe-ed:  LLE SLS - average of 3.2 secs Tandem stance 30 secs x 1 rep - each foot position - no LOB  Discussed LTG's and progress; HEP with use of weights for strengthening and balance exercises - SLS and tandem   UPDATED HEP AS FOLLOWS:  Medbridge Access Code: [email or web address] URL: [email or web address] Date: 08/20/2023 Prepared by: Rock Kussmaul  Exercises - Supine Active Straight Leg Raise  - 1 x daily - 7 x weekly - 3 sets - 10 reps - Prone Knee Flexion  - 1 x daily - 7 x weekly - 3 sets - 10 reps - Prone Hip Extension  - 1 x daily - 7 x weekly - 3 sets - 10 reps - Prone Hip Extension with Bent Knee - Two Pillows  - 1 x daily - 7 x weekly - 1 sets - 10 reps - Single Leg Bridge  - 1 x daily - 7 x weekly - 1 sets - 10 reps - Marching Bridge  - 1 x daily - 7 x weekly - 1 sets - 10 reps  -------------------------------------------------------------   Access Code: [email or web address] URL: [email or web address] Date: 07/31/2023 Prepared by: Rock Kussmaul  Exercises - Seated Ankle Dorsiflexion with Resistance  - 1 x daily - 7 x weekly - 3 sets - 10 reps - 5 hold - Standing Repeated Hip Flexion with Resistance  - 1 x daily - 7 x weekly - 3 sets - 10 reps - Hip and Knee Flexion with Anchored Resistance  - 1 x daily - 7 x weekly - 3 sets - 10 reps - Standing Repeated Hip Extension with Resistance  - 1 x daily - 7 x weekly - 3 sets - 10 reps - Standing Hip Abduction with Theraband Resistance  - 1 x daily - 7 x weekly - 3 sets - 10 reps - Toe Walking  - 1 x daily - 7 x weekly - 1-2 sets - 10 reps - Heel Walking  - 1 x daily - 7 x weekly - 1-2 sets - 10 reps     Medbridge HEP:    Access Code: [facility] URL: [email or web address] Date: 07/24/2023 Prepared by: Rock Kussmaul  Exercises - Single Leg Heel Raise  - 1 x daily - 7 x weekly - 3 sets - 10 reps - 3-4 sec hold -  Supine Hip Flexion with Resistance  - 1 x daily - 7 x weekly - 3 sets - 10 reps - Seated Hamstring Curl with Anchored Resistance  - 1 x daily - 7 x weekly - 3 sets - 10 reps - Sidelying Hip Abduction and Extension with Loop Band  - 1 x daily - 7 x weekly - 3 sets - 10 reps - Romberg Stance Eyes Closed on Foam Pad  - 1 x daily - 7 x weekly - 1 sets - 1-2 reps - 30 sec  hold - Single Leg Stance with Support  - 1 x daily - 7 x weekly - 1-2 sets - 2 reps - 10  sec hold   Pt education:  HEP, POC, and eval results Person educated: Patient Education method: Explanation, Demonstration, and Handouts Education comprehension: verbalized understanding, returned demonstration, and needs further education  HOME EXERCISE PROGRAM: See above  GOALS: Goals reviewed with patient? Yes  SHORT TERM GOALS: same as LTG's as ELOS = 4 weeks    LONG TERM GOALS: Target date: 08-24-23  Pt will increase gait velocity to >/= 3.2 ft/sec without device for increased gait efficiency. Baseline: 2.79 ft/sec without use of assistive device;  9.16 secs =  3.58 ft/sec without device Goal status: Goal met 09-10-23  2.  Pt will subjectively report at least 25% improvement of left leg feeling heavy after amb. 30 for walking program. Baseline: Pt reports LLE feels very heavy and impacts gait pattern after ambulating 30 - results in decreased gait speed Goal status: Goal met 09-10-23  3.  Pt will demonstrate improved balance and increased vestibular input by standing for 30 secs on compliant surface with feet together with minimal postural sway. Baseline: 14.94 secs with moderate postural sway:  30 secs -- 09-10-23 Goal status: Goal met 09-10-23  4.  Demonstrate improved balance by performing SLS on LLE for at least 10 secs for increased safety with step negotiation and stepping over objects.  Baseline: 7.62 secs on LLE on 3rd trial; 09-10-23;   3.88 secs average of 3 trials Goal status: In Progress   5.  Independent in  HEP for LLE strengthening and balance/vestibular exercises. Baseline: Dependent Goal status: Ongoing  09-10-23   UPDATED LTG'S:  TARGET DATE 10-09-23 (2 ADDITIONAL VISITS ONLY)  1.  Demonstrate improved balance by performing SLS on LLE for at least 10 secs for increased safety with step negotiation and stepping over objects.  Baseline: 7.62 secs on LLE on 3rd trial; 09-10-23;   3.88 secs average of 3 trials;  09-22-23 average of 3.2 secs on LLE Goal status: Not met 09-22-23  2.  Independent in HEP for LLE strengthening and balance/vestibular exercises. Baseline: Dependent Goal status:  Goal met 09-22-23         ASSESSMENT:  CLINICAL IMPRESSION: Pt has met updated LTG #2 but LTG #1 not met as pt unable to perform SLS on LLE for 10 secs in today's session.  Pt has made good progress in PT and reports he understands exercises for HEP for continued LLE strengthening.  Gait and balance are WNL's.  Pt is discharged due to goals achieved.     OBJECTIVE IMPAIRMENTS: Abnormal gait, decreased balance, and decreased strength.   ACTIVITY LIMITATIONS: stairs and locomotion level  PARTICIPATION LIMITATIONS: community activity and yard work  PERSONAL FACTORS: Past/current experiences, Time since onset of injury/illness/exacerbation, and 1 comorbidity: h/o CVA I 2015 are also affecting patient's functional outcome.   REHAB POTENTIAL: Excellent  CLINICAL DECISION MAKING: Evolving/moderate complexity  EVALUATION COMPLEXITY: Moderate  PLAN:  PT FREQUENCY: 1x/week  PT DURATION: 4 weeks+ eval  PLANNED INTERVENTIONS: 97110-Therapeutic exercises, 97530- Therapeutic activity, 97112- Neuromuscular re-education, 97535- Self Care, and 02883- Gait training  PLAN FOR NEXT SESSION: N/A - D/C  PHYSICAL THERAPY DISCHARGE SUMMARY  Visits from Start of Care: 5  Current functional level related to goals / functional outcomes: See above for progress towards goals   Remaining deficits: Minimally  decreased strength in LLE; decreased SLS on LLE   Education / Equipment: Pt has been instructed in HEP for LLE strengthening and balance exercises. Pt reports compliance with HEP.   Patient agrees to discharge. Patient goals were met. Patient is being discharged due to meeting the stated rehab goals. No further needs indentified at this time.    Roxanna [place], PT 09/23/2023, 8:10 PM   Check all possible CPT codes: 02889- Therapeutic Exercise, [phone]- Neuro Re-education, [phone] - Gait  Training, 02469 - Therapeutic Activities, and [phone] - Self Care    Check all conditions that are expected to impact treatment: Diabetes mellitus and Neurological condition and/or seizures   If treatment provided at initial evaluation, no treatment charged due to lack of authorization.

## 2023-09-23 ENCOUNTER — Encounter: Payer: Self-pay | Admitting: Physical Therapy

## 2023-09-23 DIAGNOSIS — Z940 Kidney transplant status: Principal | ICD-10-CM

## 2023-09-23 MED ORDER — MYCOPHENOLATE SODIUM 180 MG TABLET,DELAYED RELEASE
ORAL_TABLET | Freq: Two times a day (BID) | ORAL | 3 refills | 90.00000 days | Status: CP
Start: 2023-09-23 — End: 2024-09-22
  Filled 2023-10-05: qty 360, 90d supply, fill #0

## 2023-09-24 NOTE — Unmapped (Signed)
 [facility] Refill Coordination Note    Specialty Medication(s) to be Shipped:   Transplant: mycophenolate  mofetil 180mg  and tacrolimus  1mg     Other medication(s) to be shipped: No additional medications requested for fill at this time     Patrick Vazquez, DOB: 05-01-1975  Phone: [phone] (home) [phone] (work)      All above HIPAA information was verified with patient.     Was a Nurse, learning disability used for this call? No    Completed refill call assessment today to schedule patient's medication shipment from the [facility]  [phone]).  All relevant notes have been reviewed.     Specialty medication(s) and dose(s) confirmed: Regimen is correct and unchanged.   Changes to medications: Patrick Vazquez reports no changes at this time.  Changes to insurance: No  New side effects reported not previously addressed with a pharmacist or physician: None reported  Questions for the pharmacist: No    Confirmed patient received a Conservation officer, historic buildings and a Surveyor, mining with first shipment. The patient will receive a drug information handout for each medication shipped and additional FDA Medication Guides as required.       DISEASE/MEDICATION-SPECIFIC INFORMATION        N/A    SPECIALTY MEDICATION ADHERENCE     Medication Adherence    Patient reported X missed doses in the last month: 0  Specialty Medication: tacrolimus  1 MG capsule (PROGRAF )  Patient is on additional specialty medications: Yes  Additional Specialty Medications: mycophenolate  180 MG EC tablet (MYFORTIC )  Patient Reported Additional Medication X Missed Doses in the Last Month: 0  Patient is on more than two specialty medications: No  Any gaps in refill history greater than 2 weeks in the last 3 months: no  Demonstrates understanding of importance of adherence: yes                Were doses missed due to medication being on hold? No    tacrolimus  1  mg:  14+days of medicine on hand mycophenolate  180  mg:14+  days of medicine on hand       REFERRAL TO PHARMACIST     Referral to the pharmacist: Not needed      [facility]     Shipping address confirmed in Epic.     Cost and Payment: Patient has a copay of $8.00. They are aware and have authorized the pharmacy to charge the credit card on file.    Delivery Scheduled: Yes, Expected medication delivery date: 10/06/23.     Medication will be delivered via UPS to the prescription address in Epic WAM.    Tawni Daring   [facility]  Specialty Technician

## 2023-10-05 MED FILL — TACROLIMUS 1 MG CAPSULE, IMMEDIATE-RELEASE: ORAL | 90 days supply | Qty: 540 | Fill #2

## 2023-11-13 MED FILL — OMEPRAZOLE 40 MG CAPSULE,DELAYED RELEASE: ORAL | 90 days supply | Qty: 90 | Fill #2

## 2023-11-18 ENCOUNTER — Ambulatory Visit: Admit: 2023-11-18 | Discharge: 2023-11-19 | Payer: Medicaid (Managed Care)

## 2023-11-18 DIAGNOSIS — Z940 Kidney transplant status: Principal | ICD-10-CM

## 2023-11-18 LAB — URINALYSIS WITH MICROSCOPY
BACTERIA: NONE SEEN /HPF
BILIRUBIN UA: NEGATIVE
BLOOD UA: NEGATIVE
GLUCOSE UA: 150 — AB
HYALINE CASTS: 3 /LPF — ABNORMAL HIGH (ref 0–1)
KETONES UA: NEGATIVE
LEUKOCYTE ESTERASE UA: NEGATIVE
NITRITE UA: NEGATIVE
PH UA: 5.5 (ref 5.0–9.0)
PROTEIN UA: NEGATIVE
RBC UA: <1 /HPF (ref <=3)
SPECIFIC GRAVITY UA: 1.009 (ref 1.003–1.030)
SQUAMOUS EPITHELIAL: <1 /HPF (ref 0–5)
UROBILINOGEN UA: <2
WBC UA: 2 /HPF (ref <=2)

## 2023-11-18 LAB — CBC W/ AUTO DIFF
BASOPHILS ABSOLUTE COUNT: 0 10*9/L (ref 0.0–0.1)
BASOPHILS RELATIVE PERCENT: 0.5 %
EOSINOPHILS ABSOLUTE COUNT: 0.1 10*9/L (ref 0.0–0.5)
EOSINOPHILS RELATIVE PERCENT: 1.7 %
HEMATOCRIT: 40.5 % (ref 39.0–48.0)
HEMOGLOBIN: 13.4 g/dL (ref 12.9–16.5)
LYMPHOCYTES ABSOLUTE COUNT: 2.1 10*9/L (ref 1.1–3.6)
LYMPHOCYTES RELATIVE PERCENT: 44.4 %
MEAN CORPUSCULAR HEMOGLOBIN CONC: 33.1 g/dL (ref 32.0–36.0)
MEAN CORPUSCULAR HEMOGLOBIN: 27.8 pg (ref 25.9–32.4)
MEAN CORPUSCULAR VOLUME: 83.9 fL (ref 77.6–95.7)
MEAN PLATELET VOLUME: 9.6 fL (ref 6.8–10.7)
MONOCYTES ABSOLUTE COUNT: 0.5 10*9/L (ref 0.3–0.8)
MONOCYTES RELATIVE PERCENT: 9.4 %
NEUTROPHILS ABSOLUTE COUNT: 2.1 10*9/L (ref 1.8–7.8)
NEUTROPHILS RELATIVE PERCENT: 44 %
PLATELET COUNT: 228 10*9/L (ref 150–450)
RED BLOOD CELL COUNT: 4.82 10*12/L (ref 4.26–5.60)
RED CELL DISTRIBUTION WIDTH: 14.9 % (ref 12.2–15.2)
WBC ADJUSTED: 4.8 10*9/L (ref 3.6–11.2)

## 2023-11-18 LAB — COMPREHENSIVE METABOLIC PANEL
ALBUMIN: 3.8 g/dL (ref 3.4–5.0)
ALKALINE PHOSPHATASE: 94 U/L (ref 46–116)
ALT (SGPT): 15 U/L (ref 10–49)
ANION GAP: 11 mmol/L (ref 5–14)
AST (SGOT): 22 U/L (ref <=34)
BILIRUBIN TOTAL: 0.4 mg/dL (ref 0.3–1.2)
BLOOD UREA NITROGEN: 25 mg/dL — ABNORMAL HIGH (ref 9–23)
BUN / CREAT RATIO: 13
CALCIUM: 9.6 mg/dL (ref 8.7–10.4)
CHLORIDE: 106 mmol/L (ref 98–107)
CO2: 22.1 mmol/L (ref 20.0–31.0)
CREATININE: 1.94 mg/dL — ABNORMAL HIGH (ref 0.73–1.18)
EGFR CKD-EPI (2021) MALE: 42 mL/min/1.73m2 — ABNORMAL LOW (ref >=60)
GLUCOSE RANDOM: 84 mg/dL (ref 70–179)
POTASSIUM: 5 mmol/L — ABNORMAL HIGH (ref 3.4–4.8)
PROTEIN TOTAL: 7.9 g/dL (ref 5.7–8.2)
SODIUM: 139 mmol/L (ref 135–145)

## 2023-11-18 LAB — PROTEIN / CREATININE RATIO, URINE
CREATININE, URINE: 96.4 mg/dL
PROTEIN URINE: 10.5 mg/dL
PROTEIN/CREAT RATIO, URINE: 0.109

## 2023-11-18 LAB — ALBUMIN / CREATININE URINE RATIO
ALBUMIN QUANT URINE: 1.9 mg/dL
ALBUMIN/CREATININE RATIO: 19.7 ug/mg (ref 0.0–30.0)
CREATININE, URINE: 96.4 mg/dL

## 2023-11-18 LAB — PHOSPHORUS: PHOSPHORUS: 1.9 mg/dL — ABNORMAL LOW (ref 2.4–5.1)

## 2023-11-18 LAB — MAGNESIUM: MAGNESIUM: 1.7 mg/dL (ref 1.6–2.6)

## 2023-11-18 NOTE — Unmapped (Signed)
 The [facility] has made a third and final attempt to reach this patient to refill the following medication:maintenance medications, clinical for mycophenolate , tacrolimus .      We have left voicemails on the following phone numbers: [phone] and have sent a MyChart message.    Dates contacted: 9/15, 9/19, 9/24  Last scheduled delivery: tac/myco 8/11, amlod 5/7, atorv/olmesartan  1/3, not filled coreg  and hydrochlorothiazide     The patient may be at risk of non-compliance with this medication. The patient should call the [facility] at [phone]  Option 4, then Option 4: Infectious Disease, Transplant to refill medication.    Ronal FORBES Reusing, PharmD   [facility] Specialty Pharmacist

## 2023-11-19 LAB — TACROLIMUS LEVEL: TACROLIMUS BLOOD: 6.2 ng/mL

## 2023-11-19 LAB — CMV DNA, QUANTITATIVE, PCR: CMV VIRAL LD: NOT DETECTED

## 2023-12-16 ENCOUNTER — Ambulatory Visit: Admit: 2023-12-16 | Discharge: 2023-12-17 | Payer: Medicaid (Managed Care)

## 2023-12-16 DIAGNOSIS — Z940 Kidney transplant status: Principal | ICD-10-CM

## 2023-12-16 LAB — COMPREHENSIVE METABOLIC PANEL
ALBUMIN: 3.5 g/dL (ref 3.4–5.0)
ALKALINE PHOSPHATASE: 88 U/L (ref 46–116)
ALT (SGPT): 13 U/L (ref 10–49)
ANION GAP: 15 mmol/L — ABNORMAL HIGH (ref 5–14)
BILIRUBIN TOTAL: 0.3 mg/dL (ref 0.3–1.2)
BLOOD UREA NITROGEN: 20 mg/dL (ref 9–23)
BUN / CREAT RATIO: 13
CALCIUM: 9.4 mg/dL (ref 8.7–10.4)
CHLORIDE: 105 mmol/L (ref 98–107)
CO2: 19.1 mmol/L — ABNORMAL LOW (ref 20.0–31.0)
CREATININE: 1.5 mg/dL — ABNORMAL HIGH (ref 0.73–1.18)
EGFR CKD-EPI (2021) MALE: 57 mL/min/1.73m2 — ABNORMAL LOW (ref >=60)
GLUCOSE RANDOM: 186 mg/dL — ABNORMAL HIGH (ref 70–179)
PROTEIN TOTAL: 7.7 g/dL (ref 5.7–8.2)
SODIUM: 139 mmol/L (ref 135–145)

## 2023-12-16 LAB — CBC W/ AUTO DIFF
BASOPHILS ABSOLUTE COUNT: 0 10*9/L (ref 0.0–0.1)
BASOPHILS RELATIVE PERCENT: 0.6 %
EOSINOPHILS ABSOLUTE COUNT: 0.2 10*9/L (ref 0.0–0.5)
EOSINOPHILS RELATIVE PERCENT: 4.3 %
HEMATOCRIT: 40.5 % (ref 39.0–48.0)
HEMOGLOBIN: 13.5 g/dL (ref 12.9–16.5)
LYMPHOCYTES ABSOLUTE COUNT: 1.8 10*9/L (ref 1.1–3.6)
LYMPHOCYTES RELATIVE PERCENT: 31.6 %
MEAN CORPUSCULAR HEMOGLOBIN CONC: 33.3 g/dL (ref 32.0–36.0)
MEAN CORPUSCULAR HEMOGLOBIN: 28.1 pg (ref 25.9–32.4)
MEAN CORPUSCULAR VOLUME: 84.3 fL (ref 77.6–95.7)
MEAN PLATELET VOLUME: 9.9 fL (ref 6.8–10.7)
MONOCYTES ABSOLUTE COUNT: 0.4 10*9/L (ref 0.3–0.8)
MONOCYTES RELATIVE PERCENT: 7.1 %
NEUTROPHILS ABSOLUTE COUNT: 3.2 10*9/L (ref 1.8–7.8)
NEUTROPHILS RELATIVE PERCENT: 56.4 %
PLATELET COUNT: 220 10*9/L (ref 150–450)
RED BLOOD CELL COUNT: 4.8 10*12/L (ref 4.26–5.60)
RED CELL DISTRIBUTION WIDTH: 14.7 % (ref 12.2–15.2)
WBC ADJUSTED: 5.6 10*9/L (ref 3.6–11.2)

## 2023-12-16 LAB — URINALYSIS WITH MICROSCOPY
BACTERIA: NONE SEEN /HPF
BILIRUBIN UA: NEGATIVE
BLOOD UA: NEGATIVE
GLUCOSE UA: 70 — AB
KETONES UA: NEGATIVE
LEUKOCYTE ESTERASE UA: NEGATIVE
NITRITE UA: NEGATIVE
PH UA: 5.5 (ref 5.0–9.0)
PROTEIN UA: NEGATIVE
RBC UA: 1 /HPF (ref <=3)
SPECIFIC GRAVITY UA: 1.014 (ref 1.003–1.030)
SQUAMOUS EPITHELIAL: <1 /HPF (ref 0–5)
UROBILINOGEN UA: <2
WBC UA: 1 /HPF (ref <=2)

## 2023-12-16 LAB — PROTEIN / CREATININE RATIO, URINE
CREATININE, URINE: 115.8 mg/dL
PROTEIN URINE: 18.7 mg/dL
PROTEIN/CREAT RATIO, URINE: 0.161

## 2023-12-16 LAB — MAGNESIUM: MAGNESIUM: 1.6 mg/dL (ref 1.6–2.6)

## 2023-12-16 LAB — ALBUMIN / CREATININE URINE RATIO
ALBUMIN QUANT URINE: 4.2 mg/dL
ALBUMIN/CREATININE RATIO: 36.3 ug/mg — ABNORMAL HIGH (ref 0.0–30.0)
CREATININE, URINE: 115.8 mg/dL

## 2023-12-16 LAB — PHOSPHORUS: PHOSPHORUS: 1.5 mg/dL — ABNORMAL LOW (ref 2.4–5.1)

## 2023-12-17 ENCOUNTER — Encounter: Payer: Self-pay | Admitting: Gastroenterology

## 2023-12-17 LAB — CMV DNA, QUANTITATIVE, PCR: CMV VIRAL LD: NOT DETECTED

## 2023-12-17 LAB — TACROLIMUS LEVEL: TACROLIMUS BLOOD: 5.4 ng/mL

## 2023-12-23 MED ORDER — AMLODIPINE 10 MG TABLET
ORAL_TABLET | Freq: Every day | ORAL | 3 refills | 90.00000 days | Status: CP
Start: 2023-12-23 — End: 2024-12-22
  Filled 2023-12-25: qty 90, 90d supply, fill #0

## 2023-12-23 NOTE — Progress Notes (Signed)
 [facility] Clinical Assessment & Refill Coordination Note    Patrick Vazquez, Milo: [birth year]  Phone: [phone] (home) [phone] (work)    All above HIPAA information was verified with patient.     Was a nurse, learning disability used for this call? No    Specialty Medication(s):   Transplant: mycophenolic acid 180mg  and tacrolimus  1mg      Current Medications[1]     Changes to medications: Mackenzy Reports stopping the following medications: lipitor, benicar , hydrochlorothiazide  - messaged clinic to dc rxs    Medication list has been reviewed and updated in Epic: Yes  No high priority alerts noted on med rec today    Allergies[2]    Changes to allergies: No    Allergies have been reviewed and updated in Epic: yes    SPECIALTY MEDICATION ADHERENCE     Tacrolimus  1mg   : 9 days of medicine on hand   Mycophenolate  180mg   : 9 days of medicine on hand       Medication Adherence    Patient reported X missed doses in the last month: 0  Specialty Medication: mycophenolate  180mg   Patient is on additional specialty medications: Yes  Additional Specialty Medications: Tacrolimus  1mg   Patient Reported Additional Medication X Missed Doses in the Last Month: 0  Patient is on more than two specialty medications: No          Specialty medication(s) dose(s) confirmed: Regimen is correct and unchanged.     Are there any concerns with adherence? No    Adherence counseling provided? Not needed    CLINICAL MANAGEMENT AND INTERVENTION      Clinical Benefit Assessment:    Do you feel the medicine is effective or helping your condition? Yes    Clinical Benefit counseling provided? Not needed    Adverse Effects Assessment:    Are you experiencing any side effects? No    Are you experiencing difficulty administering your medicine? No    Quality of Life Assessment:    Quality of Life    Rheumatology  Oncology  Dermatology  Cystic Fibrosis          How many days over the past month did your transplant  keep you from your normal activities? For example, brushing your teeth or getting up in the morning. 0    Have you discussed this with your provider? Not needed    Acute Infection Status:    Acute infections noted within Epic:  No active infections    Patient reported infection: None    Therapy Appropriateness:    Is the medication and dose appropriate considering the patient???s diagnosis, treatment, and disease journey, comorbidities, medical history, current medications, allergies, therapeutic goals, self-administration ability, and access barriers? Yes, therapy is appropriate and should be continued     Clinical Intervention:    Was an intervention completed as part of this clinical assessment? No    DISEASE/MEDICATION-SPECIFIC INFORMATION      N/A    Solid Organ Transplant: Not Applicable    PATIENT SPECIFIC NEEDS     Does the patient have any physical, cognitive, or cultural barriers? No    Is the patient high risk? Yes, patient is taking a REMS drug. Medication is dispensed in compliance with REMS program    Does the patient require physician intervention or other additional services (i.e., nutrition, smoking cessation, social work)? No    Does the patient have an additional or emergency contact listed in their chart? Yes  SOCIAL DETERMINANTS OF HEALTH     At the [facility], we have learned that life circumstances - like trouble affording food, housing, utilities, or transportation can affect the health of many of our patients.   That is why we wanted to ask: are you currently experiencing any life circumstances that are negatively impacting your health and/or quality of life? Patient declined to answer    Social Drivers of Health     Food Insecurity: Patient Declined (07/26/2023)    Received from [facility]    Hunger Vital Sign     Within the past 12 months, you worried that your food would run out before you got the money to buy more.: Patient declined     Within the past 12 months, the food you bought just didn't last and you didn't have money to get more.: Patient declined   Tobacco Use: Medium Risk (09/23/2023)    Received from [facility]    Patient History     Smoking Tobacco Use: Former     Smokeless Tobacco Use: Former     Passive Exposure: Not on Occupational Hygienist Needs: No Transportation Needs (07/26/2023)    Received from [facility]    PRAPARE - Transportation     In the past 12 months, has lack of transportation kept you from medical appointments or from getting medications?: No     Lack of Transportation (Non-Medical): No   Alcohol Use: Not on file   Housing: Unknown (07/26/2023)    Received from [facility] Stability Vital Sign     In the last 12 months, was there a time when you were not able to pay the mortgage or rent on time?: No     Number of Times Moved in the Last Year: Not on file     At any time in the past 12 months, were you homeless or living in a shelter (including now)?: No   Physical Activity: Not on file   Utilities: Not At Risk (07/26/2023)    Received from [facility] Utilities     Threatened with loss of utilities: No   Stress: Not on file   Interpersonal Safety: Not on file   Substance Use: Not on file (12/31/2022)   Intimate Partner Violence: Not on file   Social Connections: Not on file   Financial Resource Strain: Patient Declined (07/26/2023)    Received from [facility]    Overall Financial Resource Strain (CARDIA)     Difficulty of Paying Living Expenses: Patient declined   Health Literacy: Not on file   Internet Connectivity: Not on file       Would you be willing to receive help with any of the needs that you have identified today? Not applicable       SHIPPING     Specialty Medication(s) to be Shipped:   Transplant: mycophenolic acid 180mg  and tacrolimus  1mg     Other medication(s) to be shipped: amlodipine , carvedilol     Specialty Medications not needed at this time: N/A     Changes to insurance: No    Cost and Payment: Patient has a copay of $4 each med. They are aware and have authorized the pharmacy to charge the credit card on file.    Delivery Scheduled: Yes, Expected medication delivery date: 12/28/2023.  However, Rx request for refills was sent to the provider as there are  none remaining.     Medication will be delivered via UPS to the confirmed prescription address in [facility].    The patient will receive a drug information handout for each medication shipped and additional FDA Medication Guides as required.  Verified that patient has previously received a Conservation Officer, Historic Buildings and a Surveyor, Mining.    The patient or caregiver noted above participated in the development of this care plan and knows that they can request review of or adjustments to the care plan at any time.      All of the patient's questions and concerns have been addressed.    Ronal FORBES Reusing, PharmD   [facility] Specialty Pharmacist       [1]   Current Outpatient Medications   Medication Sig Dispense Refill    acetaminophen  (TYLENOL ) 325 MG tablet Take 1-2 tablets (325-650 mg total) by mouth every four (4) hours as needed for pain. 100 tablet 0    amlodipine  (NORVASC ) 10 MG tablet Take 1 tablet (10 mg total) by mouth daily. 90 tablet 3    aspirin (ECOTRIN) 81 MG tablet Take 1 tablet (81 mg total) by mouth daily. 30 tablet 11    atorvastatin  (LIPITOR) 40 MG tablet Take 1 tablet (40 mg total) by mouth every evening. (Patient not taking: Reported on 12/23/2023) 90 tablet 3    blood sugar diagnostic Strp Test Blood Sugar four times a day; E11.9 120 strip 5    carvedilol  (COREG ) 12.5 MG tablet Take 1 tablet (12.5 mg total) by mouth two (2) times a day. 180 tablet 3    cholecalciferol , vitamin D3, (VITAMIN D3) 25 mcg (1,000 unit) Chew Chew 1 tablet daily.      clindamycin  (CLEOCIN  T) 1 % external solution Apply to scalp and neck daily to reduce inflammation from ingrown hairs 60 mL 11 fluorouracil  (EFUDEX ) 5 % cream Apply 2x/day for 1-3 weeks as tolerated to treat areas on the scalp and neck 40 g 2    HUMALOG  U-100 INSULIN  100 unit/mL injection       hydroCHLOROthiazide  12.5 MG tablet Take 1 tablet (12.5 mg total) by mouth daily. (Patient not taking: Reported on 12/23/2023) 100 tablet 3    insulin  lispro (HUMALOG ) 100 unit/mL injection pen Inject 8 Units under the skin Three (3) times a day before meals. Dx code: E11.9 Diabetes (Patient taking differently: Inject 8 Units under the skin continuous. Dx code: E11.9 Diabetes) 15 mL 5    insulin  NPH (HUMULIN,NOVOLIN) 100 unit/mL injection Inject 20 units daily at 8:00 AM and inject 28 units daily at 10 PM.      insulin  syringe-needle U-100 (BD VEO INSULIN  SYRINGE UF) 1/2 mL 31 gauge x 15/64 (6 mm) Syrg CHECK BLOOD SUGAR 4 TIMES DAILY. 100 each 11    magnesium  oxide (MAG-OX) 400 mg (241.3 mg elemental magnesium ) tablet Take 1 tablet (400 mg total) by mouth two (2) times a day.      mycophenolate  (MYFORTIC ) 180 MG EC tablet Take 2 tablets (360 mg total) by mouth two (2) times a day. 360 tablet 3    olmesartan  (BENICAR ) 20 MG tablet Take 1 tablet (20 mg total) by mouth daily. (Patient not taking: Reported on 12/23/2023) 90 tablet 3    omeprazole  (PRILOSEC) 40 MG capsule Take 1 capsule (40 mg total) by mouth daily. 90 capsule 3    tacrolimus  (PROGRAF ) 1 MG capsule Take 3 capsules (3 mg total) by mouth daily AND 3 capsules (  3 mg total) nightly. 540 capsule 3     No current facility-administered medications for this visit.   [2] No Known Allergies

## 2023-12-23 NOTE — Progress Notes (Signed)
 duplicate

## 2023-12-25 MED FILL — CARVEDILOL 12.5 MG TABLET: ORAL | 90 days supply | Qty: 180 | Fill #0

## 2023-12-25 MED FILL — TACROLIMUS 1 MG CAPSULE, IMMEDIATE-RELEASE: ORAL | 90 days supply | Qty: 540 | Fill #3

## 2023-12-25 MED FILL — MYCOPHENOLATE SODIUM 180 MG TABLET,DELAYED RELEASE: ORAL | 90 days supply | Qty: 360 | Fill #1

## 2024-01-11 ENCOUNTER — Ambulatory Visit: Admit: 2024-01-11 | Discharge: 2024-01-12 | Payer: Medicaid (Managed Care)

## 2024-01-11 DIAGNOSIS — Z940 Kidney transplant status: Principal | ICD-10-CM

## 2024-01-11 LAB — URINALYSIS WITH MICROSCOPY
BACTERIA: NONE SEEN /HPF
BILIRUBIN UA: NEGATIVE
BLOOD UA: NEGATIVE
GLUCOSE UA: NEGATIVE
KETONES UA: NEGATIVE
LEUKOCYTE ESTERASE UA: NEGATIVE
NITRITE UA: NEGATIVE
PH UA: 5.5 (ref 5.0–9.0)
RBC UA: <1 /HPF (ref <=3)
SPECIFIC GRAVITY UA: 1.017 (ref 1.003–1.030)
SQUAMOUS EPITHELIAL: <1 /HPF (ref 0–5)
UROBILINOGEN UA: <2
WBC UA: <1 /HPF (ref <=2)

## 2024-01-11 LAB — PROTEIN / CREATININE RATIO, URINE
CREATININE, URINE: 136.5 mg/dL
PROTEIN URINE: 26.2 mg/dL
PROTEIN/CREAT RATIO, URINE: 0.192

## 2024-01-11 LAB — CBC W/ AUTO DIFF
BASOPHILS ABSOLUTE COUNT: 0 10*9/L (ref 0.0–0.1)
BASOPHILS RELATIVE PERCENT: 0.9 %
EOSINOPHILS ABSOLUTE COUNT: 0.2 10*9/L (ref 0.0–0.5)
EOSINOPHILS RELATIVE PERCENT: 4.5 %
HEMATOCRIT: 41.8 % (ref 39.0–48.0)
HEMOGLOBIN: 13.7 g/dL (ref 12.9–16.5)
LYMPHOCYTES ABSOLUTE COUNT: 1.8 10*9/L (ref 1.1–3.6)
LYMPHOCYTES RELATIVE PERCENT: 36.5 %
MEAN CORPUSCULAR HEMOGLOBIN CONC: 32.9 g/dL (ref 32.0–36.0)
MEAN CORPUSCULAR HEMOGLOBIN: 27.9 pg (ref 25.9–32.4)
MEAN CORPUSCULAR VOLUME: 84.7 fL (ref 77.6–95.7)
MEAN PLATELET VOLUME: 9.7 fL (ref 6.8–10.7)
MONOCYTES ABSOLUTE COUNT: 0.3 10*9/L (ref 0.3–0.8)
MONOCYTES RELATIVE PERCENT: 7.2 %
NEUTROPHILS ABSOLUTE COUNT: 2.5 10*9/L (ref 1.8–7.8)
NEUTROPHILS RELATIVE PERCENT: 50.9 %
PLATELET COUNT: 253 10*9/L (ref 150–450)
RED BLOOD CELL COUNT: 4.93 10*12/L (ref 4.26–5.60)
RED CELL DISTRIBUTION WIDTH: 14.5 % (ref 12.2–15.2)
WBC ADJUSTED: 4.9 10*9/L (ref 3.6–11.2)

## 2024-01-11 LAB — COMPREHENSIVE METABOLIC PANEL
ALBUMIN: 3.6 g/dL (ref 3.4–5.0)
ALKALINE PHOSPHATASE: 108 U/L (ref 46–116)
ALT (SGPT): 19 U/L (ref 10–49)
ANION GAP: 11 mmol/L (ref 5–14)
AST (SGOT): 26 U/L (ref <=34)
BILIRUBIN TOTAL: 0.3 mg/dL (ref 0.3–1.2)
BLOOD UREA NITROGEN: 18 mg/dL (ref 9–23)
BUN / CREAT RATIO: 11
CALCIUM: 9.8 mg/dL (ref 8.7–10.4)
CHLORIDE: 104 mmol/L (ref 98–107)
CO2: 23.4 mmol/L (ref 20.0–31.0)
CREATININE: 1.61 mg/dL — ABNORMAL HIGH (ref 0.73–1.18)
EGFR CKD-EPI (2021) MALE: 52 mL/min/1.73m2 — ABNORMAL LOW (ref >=60)
GLUCOSE RANDOM: 170 mg/dL (ref 70–179)
POTASSIUM: 5.1 mmol/L — ABNORMAL HIGH (ref 3.4–4.8)
PROTEIN TOTAL: 8 g/dL (ref 5.7–8.2)
SODIUM: 138 mmol/L (ref 135–145)

## 2024-01-11 LAB — CMV DNA, QUANTITATIVE, PCR
CMV QUANT: <35 [IU]/mL — ABNORMAL HIGH (ref <0)
CMV VIRAL LD: DETECTED — AB

## 2024-01-11 LAB — MAGNESIUM: MAGNESIUM: 1.5 mg/dL — ABNORMAL LOW (ref 1.6–2.6)

## 2024-01-11 LAB — ALBUMIN / CREATININE URINE RATIO
ALBUMIN QUANT URINE: 6.9 mg/dL
ALBUMIN/CREATININE RATIO: 50.5 ug/mg — ABNORMAL HIGH (ref 0.0–30.0)
CREATININE, URINE: 136.5 mg/dL

## 2024-01-11 LAB — PHOSPHORUS: PHOSPHORUS: 3.1 mg/dL (ref 2.4–5.1)

## 2024-01-12 LAB — TACROLIMUS LEVEL: TACROLIMUS BLOOD: 4.3 ng/mL

## 2024-01-14 MED ORDER — OMNIPOD 5 G6-G7 INTRO KIT(GEN 5) SUBCUTANEOUS CARTRIDGE AND CONTROLLER
0 refills | 0.00000 days
Start: 2024-01-14 — End: ?

## 2024-01-14 MED ORDER — OMNIPOD 5 G6-G7 PODS (GEN 5) SUBCUTANEOUS CARTRIDGE
5 refills | 0.00000 days
Start: 2024-01-14 — End: ?

## 2024-01-26 ENCOUNTER — Ambulatory Visit: Admitting: Gastroenterology

## 2024-01-26 ENCOUNTER — Encounter: Payer: Self-pay | Admitting: Gastroenterology

## 2024-01-26 ENCOUNTER — Other Ambulatory Visit: Payer: Self-pay

## 2024-01-26 VITALS — BP 132/84 | HR 84 | Temp 98.4°F | Ht 72.0 in | Wt 259.4 lb

## 2024-01-26 DIAGNOSIS — K219 Gastro-esophageal reflux disease without esophagitis: Secondary | ICD-10-CM | POA: Diagnosis not present

## 2024-01-26 DIAGNOSIS — R197 Diarrhea, unspecified: Secondary | ICD-10-CM

## 2024-01-26 DIAGNOSIS — K648 Other hemorrhoids: Secondary | ICD-10-CM | POA: Insufficient documentation

## 2024-01-26 DIAGNOSIS — N39490 Overflow incontinence: Secondary | ICD-10-CM | POA: Diagnosis not present

## 2024-01-26 MED ORDER — LINACLOTIDE 72 MCG PO CAPS: 72.0000 ug | ORAL_CAPSULE | Freq: Every day | ORAL | Status: AC

## 2024-01-26 NOTE — Progress Notes (Signed)
 Medication Samples have been provided to the patient.  Drug name: VESTA       Strength:        Qty: 4 BOXES  LOT: [id number]  Exp.Date: 2028/01  Dosing instructions: 1 CAP DAILY BEFORE BREAKFAST  The patient has been instructed regarding the correct time, dose, and frequency of taking this medication, including desired effects and most common side effects.   [name] I [name] 12:38 PM 01/26/2024

## 2024-01-26 NOTE — Progress Notes (Unsigned)
 Gastroenterology Office Note    Referring Provider: Margarete Maeola DASEN, FNP Primary Care Physician:  Margarete Maeola DASEN, FNP  Primary GI: Dr Cindie   Chief Complaint   Chief Complaint  Patient presents with   New Patient (Initial Visit)    Pt is having diarrhea. Pt can't hold his BM since colonoscopy in 2024     History of Present Illness   [name] is a 48 y.o. male presenting today at the request of Margarete Maeola DASEN, FNP due to diarrhea. Last seen in 2015 for GERD, s/p EGD at that time with reflux esophagitis, now  taking omeprazole  with great results. PMH significant for ESRD s/p renal transplant in 2017, DM, HTN, chronic GERD.    Last Colnoscopy 2024 [facility]. Normal.    Baseline bowel habits prior to colonoscopy once daily or every other day. No significant changes in medications following colonoscopy.   Will have urgency with stools. Was with girlfriend and had fecal incontinence.   Will hit like diarrhea. Took anti-diarrheal over thanksgiving and stopped up.no greasy stools.   Nov 27th last BM after taking Imodium, then had hard stool yesterday a few times then straight diarrhea.   Took 2 imodium on the 28th.   A1c 10.1 recently, baseline in the 7 range.   BM usually every 2-3 days. Then will hit and have liquid urgency. No abdominal pain. Only cramping just prior to BM but not severe. Then has relief. No weight loss or lack of appetite. No rectal bleeding.   Colonoscopy 2024 [facility] Preparation of the colon was adequate.                         - The examined portion of the ileum was normal.                         - The entire examined colon is normal.                         - No specimens collected.   EGD Feb 2015 Dr Harvey: mild erosive gastritis, reflux esophagitis. Negative H.pylori.      Past Medical History:  Diagnosis Date   Chronic kidney disease    s/p transplant   GERD (gastroesophageal reflux disease)    Hypertension     Type 1 diabetes (HCC)     Past Surgical History:  Procedure Laterality Date   AV FISTULA PLACEMENT Left 07/28/2014   Procedure: ARTERIOVENOUS (AV) FISTULA CREATION;  Surgeon: Cordella KANDICE Shawl, MD;  Location: [facility] ORS;  Service: Vascular;  Laterality: Left;   ESOPHAGOGASTRODUODENOSCOPY N/A 04/08/2013   Procedure: ESOPHAGOGASTRODUODENOSCOPY (EGD);  Surgeon: Margo LITTIE Harvey, MD;  Location: [facility];  Service: Endoscopy;  Laterality: N/A;  8:30   INSERTION OF DIALYSIS CATHETER  11/24/2012   PERIPHERAL VASCULAR CATHETERIZATION N/A 10/23/2014   Procedure: Dialysis/Perma Catheter Removal;  Surgeon: Selinda GORMAN Gu, MD;  Location: [facility] INVASIVE CV LAB;  Service: Cardiovascular;  Laterality: N/A;   TRANSPLANTATION RENAL  2017   donor was cousin who passed away from a stroke    Current Outpatient Medications  Medication Sig Dispense Refill   acetaminophen  (TYLENOL ) 325 MG tablet Take by mouth.     amLODipine (NORVASC) 10 MG tablet Take 10 mg by mouth 2 (two) times daily.      aspirin EC 81 MG tablet Take  by mouth.     atorvastatin (LIPITOR) 40 MG tablet   11   BD INSULIN SYRINGE U/F 31G X 5/16 0.5 ML MISC   5   carvedilol (COREG) 25 MG tablet Take by mouth. (Patient taking differently: Take 12.5 mg by mouth.)     HUMALOG 100 UNIT/ML injection      MYFORTIC 180 MG EC tablet   11   omeprazole  (PRILOSEC) 40 MG capsule Take 40 mg by mouth daily.     sildenafil (VIAGRA) 100 MG tablet      tacrolimus (PROGRAF) 1 MG capsule 2 (two) times a day 3 tablets in the morning and 3 tablets at night     No current facility-administered medications for this visit.    Allergies as of 01/26/2024   (No Known Allergies)    Family History  Problem Relation Age of Onset   Colon cancer Neg Hx     Social History   Socioeconomic History   Marital status: Single    Spouse name: Not on file   Number of children: Not on file   Years of education: Not on file   Highest education level: Not on file   Occupational History   Not on file  Tobacco Use   Smoking status: Former    Current packs/day: 0.50    Types: Cigarettes   Smokeless tobacco: Former    Quit date: 10/25/2013  Substance and Sexual Activity   Alcohol use: No    Comment: rarely; social only (2x so far in 2021)   Drug use: No   Sexual activity: Yes    Birth control/protection: None  Other Topics Concern   Not on file  Social History Narrative   Not on file   Social Drivers of Health   Financial Resource Strain: Patient Declined (07/26/2023)   Received from [facility]   Overall Financial Resource Strain (CARDIA)    Difficulty of Paying Living Expenses: Patient declined  Food Insecurity: Patient Declined (07/26/2023)   Received from [facility]   Hunger Vital Sign    Within the past 12 months, you worried that your food would run out before you got the money to buy more.: Patient declined    Within the past 12 months, the food you bought just didn't last and you didn't have money to get more.: Patient declined  Transportation Needs: No Transportation Needs (07/26/2023)   Received from [facility] - [facility]    In the past 12 months, has lack of transportation kept you from medical appointments or from getting medications?: No    Lack of Transportation (Non-Medical): No  Physical Activity: Not on file  Stress: Not on file  Social Connections: Not on file  Intimate Partner Violence: Not on file     Review of Systems   Gen: Denies any fever, chills, fatigue, weight loss, lack of appetite.  CV: Denies chest pain, heart palpitations, peripheral edema, syncope.  Resp: Denies shortness of breath at rest or with exertion. Denies wheezing or cough.  GI: Denies dysphagia or odynophagia. Denies jaundice, hematemesis, fecal incontinence. GU : Denies urinary burning, urinary frequency, urinary hesitancy MS: Denies joint pain, muscle weakness, cramps, or  limitation of movement.  Derm: Denies rash, itching, dry skin Psych: Denies depression, anxiety, memory loss, and confusion Heme: Denies bruising, bleeding, and enlarged lymph nodes.   Physical Exam   BP 132/84   Pulse 84   Temp 98.4 F (36.9 C)  Ht 6' (1.829 m)   Wt 259 lb 6.4 oz (117.7 kg)   BMI 35.18 kg/m  General:   Alert and oriented. Pleasant and cooperative. Well-nourished and well-developed.  Head:  Normocephalic and atraumatic. Eyes:  Without icterus Ears:  Normal auditory acuity. Lungs:  Clear to auscultation bilaterally.  Heart:  S1, S2 present without murmurs appreciated.  Abdomen:  +BS, soft, non-tender and non-distended. No HSM noted. No guarding or rebound. No masses appreciated.  Rectal: no external hemorrhoids. DRE without mass. Good tone with contracting, patient was hesitant to do valsalva. Small internal hemorrrhoid columns palpated Msk:  Symmetrical without gross deformities. Normal posture. Extremities:  Without edema. Neurologic:  Alert and  oriented x4;  grossly normal neurologically. Skin:  Intact without significant lesions or rashes. Psych:  Alert and cooperative. Normal mood and affect.   Assessment   [name] is a 48 y.o. male presenting today with    PLAN   Benefiber 2 teaspoons daily If no improvement, add Linzess  72 mcg May be good banding candidate Consider colonoscopy if alarm signs/symptoms Return in 2-3 months   Therisa MICAEL Stager, PhD, [facility] Gastroenterology

## 2024-01-26 NOTE — Patient Instructions (Signed)
 I suspect we are dealing with overflow incontinence.  Most likely you have underlying constipation and need to empty your bowels more regularly.  Internal hemorrhoids can also contribute to the sphincter having difficulty holding the stool inside especially when liquid.  I recommend taking Benefiber 2 teaspoons each morning and the beverage of your choice.  This is tasteless, colorless and odorless.  You can mix it in coffee or water  or sprinkle on your food.  Do this for a few days, and if you do not have improvement, then I would like for you to start Linzess.  You will take this in the morning 30 minutes before breakfast on empty stomach.  Take on empty stomach to avoid having more looser stool.  For the first 5 to 7 days you might have more frequent stool as there is a washout period.  try to push through this so that your body can acclimate to it.  Please message me with how you are doing so I can send in a prescription.  If needed, we can increase his dose even more because there are 2 more stronger doses available.  We will see back in 2 to 3 months.  Have a wonderful holiday season!  Please message on MyChart with how you are doing.  I enjoyed seeing you again today! I value our relationship and want to provide genuine, compassionate, and quality care. You may receive a survey regarding your visit with me, and I welcome your feedback! Thanks so much for taking the time to complete this. I look forward to seeing you again.      Therisa MICAEL Stager, PhD, ANP-BC [facility] Gastroenterology

## 2024-01-31 ENCOUNTER — Encounter: Payer: Self-pay | Admitting: Gastroenterology

## 2024-02-03 MED FILL — OMNIPOD 5 G6-G7 PODS (GEN 5) SUBCUTANEOUS CARTRIDGE: 30 days supply | Qty: 30 | Fill #0

## 2024-03-02 DIAGNOSIS — Z940 Kidney transplant status: Principal | ICD-10-CM

## 2024-03-02 DIAGNOSIS — Z796 Long term current use of immunosuppressive drug: Principal | ICD-10-CM

## 2024-03-02 MED ORDER — OMEPRAZOLE 40 MG CAPSULE,DELAYED RELEASE
ORAL_CAPSULE | Freq: Every day | ORAL | 3 refills | 90.00000 days
Start: 2024-03-02 — End: 2025-03-02

## 2024-03-03 MED ORDER — OMEPRAZOLE 40 MG CAPSULE,DELAYED RELEASE
ORAL_CAPSULE | Freq: Every day | ORAL | 3 refills | 90.00000 days | Status: CP
Start: 2024-03-03 — End: 2025-03-03
  Filled 2024-03-07: qty 90, 90d supply, fill #0

## 2024-03-04 DIAGNOSIS — Z940 Kidney transplant status: Principal | ICD-10-CM

## 2024-03-04 MED ORDER — TACROLIMUS 1 MG CAPSULE, IMMEDIATE-RELEASE
ORAL_CAPSULE | ORAL | 3 refills | 90.00000 days
Start: 2024-03-04 — End: 2025-03-04

## 2024-03-06 DIAGNOSIS — Z940 Kidney transplant status: Principal | ICD-10-CM

## 2024-03-06 MED ORDER — TACROLIMUS 1 MG CAPSULE, IMMEDIATE-RELEASE
ORAL_CAPSULE | ORAL | 3 refills | 90.00000 days
Start: 2024-03-06 — End: 2025-03-06

## 2024-03-07 MED ORDER — TACROLIMUS 1 MG CAPSULE, IMMEDIATE-RELEASE
ORAL_CAPSULE | ORAL | 3 refills | 90.00000 days | Status: CP
Start: 2024-03-07 — End: 2025-03-07

## 2024-03-09 ENCOUNTER — Ambulatory Visit: Admit: 2024-03-09 | Discharge: 2024-03-10 | Payer: Medicaid (Managed Care)

## 2024-03-09 DIAGNOSIS — Z940 Kidney transplant status: Principal | ICD-10-CM

## 2024-03-09 LAB — COMPREHENSIVE METABOLIC PANEL
ALBUMIN: 3.6 g/dL (ref 3.4–5.0)
ALKALINE PHOSPHATASE: 131 U/L — ABNORMAL HIGH (ref 46–116)
ALT (SGPT): 21 U/L (ref 10–49)
ANION GAP: 14 mmol/L (ref 5–14)
AST (SGOT): 24 U/L (ref <=34)
BILIRUBIN TOTAL: 0.4 mg/dL (ref 0.3–1.2)
BLOOD UREA NITROGEN: 21 mg/dL (ref 9–23)
BUN / CREAT RATIO: 14
CALCIUM: 9.4 mg/dL (ref 8.7–10.4)
CHLORIDE: 103 mmol/L (ref 98–107)
CO2: 18.8 mmol/L — ABNORMAL LOW (ref 20.0–31.0)
CREATININE: 1.48 mg/dL — ABNORMAL HIGH (ref 0.73–1.18)
EGFR CKD-EPI (2021) MALE: 58 mL/min/1.73m2 — ABNORMAL LOW (ref >=60)
GLUCOSE RANDOM: 333 mg/dL — ABNORMAL HIGH (ref 70–179)
POTASSIUM: 5.3 mmol/L — ABNORMAL HIGH (ref 3.4–4.8)
PROTEIN TOTAL: 7.6 g/dL (ref 5.7–8.2)
SODIUM: 136 mmol/L (ref 135–145)

## 2024-03-09 LAB — MAGNESIUM: MAGNESIUM: 1.7 mg/dL (ref 1.6–2.6)

## 2024-03-09 LAB — CBC W/ AUTO DIFF
BASOPHILS ABSOLUTE COUNT: 0 10*9/L (ref 0.0–0.1)
BASOPHILS RELATIVE PERCENT: 0.8 %
EOSINOPHILS ABSOLUTE COUNT: 0.2 10*9/L (ref 0.0–0.5)
EOSINOPHILS RELATIVE PERCENT: 3.9 %
HEMATOCRIT: 38.3 % — ABNORMAL LOW (ref 39.0–48.0)
HEMOGLOBIN: 12.6 g/dL — ABNORMAL LOW (ref 12.9–16.5)
LYMPHOCYTES ABSOLUTE COUNT: 1.3 10*9/L (ref 1.1–3.6)
LYMPHOCYTES RELATIVE PERCENT: 25.1 %
MEAN CORPUSCULAR HEMOGLOBIN CONC: 32.9 g/dL (ref 32.0–36.0)
MEAN CORPUSCULAR HEMOGLOBIN: 27.5 pg (ref 25.9–32.4)
MEAN CORPUSCULAR VOLUME: 83.5 fL (ref 77.6–95.7)
MEAN PLATELET VOLUME: 8.9 fL (ref 6.8–10.7)
MONOCYTES ABSOLUTE COUNT: 0.4 10*9/L (ref 0.3–0.8)
MONOCYTES RELATIVE PERCENT: 8.4 %
NEUTROPHILS ABSOLUTE COUNT: 3.3 10*9/L (ref 1.8–7.8)
NEUTROPHILS RELATIVE PERCENT: 61.8 %
PLATELET COUNT: 257 10*9/L (ref 150–450)
RED BLOOD CELL COUNT: 4.59 10*12/L (ref 4.26–5.60)
RED CELL DISTRIBUTION WIDTH: 15.6 % — ABNORMAL HIGH (ref 12.2–15.2)
WBC ADJUSTED: 5.3 10*9/L (ref 3.6–11.2)

## 2024-03-09 LAB — URINALYSIS WITH MICROSCOPY
BACTERIA: NONE SEEN /HPF
BILIRUBIN UA: NEGATIVE
BLOOD UA: NEGATIVE
GLUCOSE UA: 300 — AB
KETONES UA: NEGATIVE
LEUKOCYTE ESTERASE UA: NEGATIVE
NITRITE UA: NEGATIVE
PH UA: 5.5 (ref 5.0–9.0)
PROTEIN UA: NEGATIVE
RBC UA: 1 /HPF (ref <=3)
SPECIFIC GRAVITY UA: 1.016 (ref 1.003–1.030)
SQUAMOUS EPITHELIAL: <1 /HPF (ref 0–5)
UROBILINOGEN UA: <2
WBC UA: <1 /HPF (ref <=2)

## 2024-03-09 LAB — PROTEIN / CREATININE RATIO, URINE
CREATININE, URINE: 120.7 mg/dL
PROTEIN URINE: 20.7 mg/dL
PROTEIN/CREAT RATIO, URINE: 0.171

## 2024-03-09 LAB — ALBUMIN / CREATININE URINE RATIO
ALBUMIN QUANT URINE: 3.9 mg/dL
ALBUMIN/CREATININE RATIO: 32.3 ug/mg — ABNORMAL HIGH (ref 0.0–30.0)
CREATININE, URINE: 120.7 mg/dL

## 2024-03-09 LAB — PHOSPHORUS: PHOSPHORUS: 2.3 mg/dL — ABNORMAL LOW (ref 2.4–5.1)

## 2024-03-10 LAB — TACROLIMUS LEVEL: TACROLIMUS BLOOD: 4.1 ng/mL

## 2024-03-21 DIAGNOSIS — Z940 Kidney transplant status: Principal | ICD-10-CM

## 2024-03-24 NOTE — Progress Notes (Signed)
 Patrick Vazquez has been contacted in regards to their refill of tacrolimus  1 MG capsule (PROGRAF ) and mycophenolate  180 MG EC tablet (MYFORTIC ). At this time, they have declined refill due to wants to wait till his doctor appt . Refill assessment call date has been updated per the patient's request.

## 2024-04-01 LAB — CBC W/ AUTO DIFF
BASOPHILS ABSOLUTE COUNT: 0.1 10*9/L (ref 0.0–0.1)
BASOPHILS RELATIVE PERCENT: 1.2 %
EOSINOPHILS ABSOLUTE COUNT: 0.2 10*9/L (ref 0.0–0.5)
EOSINOPHILS RELATIVE PERCENT: 5 %
HEMATOCRIT: 39.8 % (ref 39.0–48.0)
HEMOGLOBIN: 13.1 g/dL (ref 12.9–16.5)
LYMPHOCYTES ABSOLUTE COUNT: 1.3 10*9/L (ref 1.1–3.6)
LYMPHOCYTES RELATIVE PERCENT: 26.8 %
MEAN CORPUSCULAR HEMOGLOBIN CONC: 32.9 g/dL (ref 32.0–36.0)
MEAN CORPUSCULAR HEMOGLOBIN: 27.9 pg (ref 25.9–32.4)
MEAN CORPUSCULAR VOLUME: 85 fL (ref 77.6–95.7)
MEAN PLATELET VOLUME: 8.9 fL (ref 6.8–10.7)
MONOCYTES ABSOLUTE COUNT: 0.4 10*9/L (ref 0.3–0.8)
MONOCYTES RELATIVE PERCENT: 8.4 %
NEUTROPHILS ABSOLUTE COUNT: 2.8 10*9/L (ref 1.8–7.8)
NEUTROPHILS RELATIVE PERCENT: 58.6 %
PLATELET COUNT: 255 10*9/L (ref 150–450)
RED BLOOD CELL COUNT: 4.68 10*12/L (ref 4.26–5.60)
RED CELL DISTRIBUTION WIDTH: 15.7 % — ABNORMAL HIGH (ref 12.2–15.2)
WBC ADJUSTED: 4.9 10*9/L (ref 3.6–11.2)

## 2024-04-01 LAB — LIPID PANEL
CHOLESTEROL: 133 mg/dL (ref <200)
HDL CHOLESTEROL: 46 mg/dL (ref >40)
LDL CHOLESTEROL CALCULATED: 70 mg/dL (ref <100)
NON-HDL CHOLESTEROL: 87 mg/dL (ref <130)
TRIGLYCERIDES: 116 mg/dL (ref <150)

## 2024-04-01 LAB — COMPREHENSIVE METABOLIC PANEL
ALBUMIN: 3.7 g/dL (ref 3.4–5.0)
ALKALINE PHOSPHATASE: 143 U/L — ABNORMAL HIGH (ref 46–116)
ALT (SGPT): 40 U/L (ref 10–49)
ANION GAP: 13 mmol/L (ref 5–14)
AST (SGOT): 38 U/L — ABNORMAL HIGH (ref <=34)
BILIRUBIN TOTAL: 0.5 mg/dL (ref 0.3–1.2)
BLOOD UREA NITROGEN: 21 mg/dL (ref 9–23)
BUN / CREAT RATIO: 14
CALCIUM: 9.7 mg/dL (ref 8.7–10.4)
CHLORIDE: 102 mmol/L (ref 98–107)
CO2: 23.1 mmol/L (ref 20.0–31.0)
CREATININE: 1.55 mg/dL — ABNORMAL HIGH (ref 0.73–1.18)
EGFR CKD-EPI (2021) MALE: 55 mL/min/{1.73_m2} — ABNORMAL LOW (ref >=60)
GLUCOSE RANDOM: 191 mg/dL — ABNORMAL HIGH (ref 70–179)
POTASSIUM: 4.8 mmol/L (ref 3.4–4.8)
PROTEIN TOTAL: 8 g/dL (ref 5.7–8.2)
SODIUM: 138 mmol/L (ref 135–145)

## 2024-04-01 LAB — URINALYSIS WITH MICROSCOPY
BACTERIA: NONE SEEN /HPF
BILIRUBIN UA: NEGATIVE
BLOOD UA: NEGATIVE
GLUCOSE UA: NEGATIVE
KETONES UA: NEGATIVE
LEUKOCYTE ESTERASE UA: NEGATIVE
NITRITE UA: NEGATIVE
PH UA: 6 (ref 5.0–9.0)
RBC UA: 1 /HPF (ref <=3)
SPECIFIC GRAVITY UA: 1.014 (ref 1.003–1.030)
SQUAMOUS EPITHELIAL: <1 /HPF (ref 0–5)
UROBILINOGEN UA: <2
WBC UA: <1 /HPF (ref <=2)

## 2024-04-01 LAB — HEMOGLOBIN A1C
ESTIMATED AVERAGE GLUCOSE: 209 mg/dL
HEMOGLOBIN A1C: 8.9 % — ABNORMAL HIGH (ref 4.8–5.6)

## 2024-04-01 LAB — FERRITIN: FERRITIN: 457.5 ng/mL — ABNORMAL HIGH (ref 30.0–307.3)

## 2024-04-01 LAB — PROTEIN / CREATININE RATIO, URINE
CREATININE, URINE: 78.6 mg/dL
PROTEIN URINE: 28.8 mg/dL
PROTEIN/CREAT RATIO, URINE: 0.366

## 2024-04-01 LAB — ALBUMIN / CREATININE URINE RATIO
ALBUMIN QUANT URINE: 10.2 mg/dL
ALBUMIN/CREATININE RATIO: 129.8 ug/mg — ABNORMAL HIGH (ref 0.0–30.0)
CREATININE, URINE: 78.6 mg/dL

## 2024-04-01 LAB — IRON & TIBC
IRON SATURATION: 27 % (ref 20–55)
IRON: 70 ug/dL (ref 65–175)
TOTAL IRON BINDING CAPACITY: 263 ug/dL (ref 250–425)

## 2024-04-01 LAB — MAGNESIUM: MAGNESIUM: 1.7 mg/dL (ref 1.6–2.6)

## 2024-04-01 LAB — PARATHYROID HORMONE (PTH): PARATHYROID HORMONE INTACT: 122.7 pg/mL — ABNORMAL HIGH (ref 18.5–88.1)

## 2024-04-01 LAB — PHOSPHORUS: PHOSPHORUS: 3.1 mg/dL (ref 2.4–5.1)

## 2024-04-01 LAB — BILIRUBIN, DIRECT: BILIRUBIN DIRECT: 0.1 mg/dL (ref 0.00–0.30)

## 2024-04-02 LAB — BK VIRUS QUANTITATIVE PCR, BLOOD: BK BLOOD RESULT: NOT DETECTED

## 2024-04-02 LAB — EBV QUANTITATIVE PCR, BLOOD: EBV VIRAL LOAD RESULT: NOT DETECTED

## 2024-04-02 LAB — CMV DNA, QUANTITATIVE, PCR: CMV VIRAL LD: NOT DETECTED

## 2024-04-26 ENCOUNTER — Ambulatory Visit: Admitting: Gastroenterology
